# Patient Record
Sex: Female | Born: 1940
Health system: Southern US, Community
[De-identification: ages and names within clinical notes are randomized; demographics above are authoritative.]

## PROBLEM LIST (undated history)

## (undated) DIAGNOSIS — K219 Gastro-esophageal reflux disease without esophagitis: Secondary | ICD-10-CM

## (undated) DIAGNOSIS — I4891 Unspecified atrial fibrillation: Secondary | ICD-10-CM

## (undated) DIAGNOSIS — G2581 Restless legs syndrome: Secondary | ICD-10-CM

## (undated) DIAGNOSIS — I10 Essential (primary) hypertension: Secondary | ICD-10-CM

## (undated) DIAGNOSIS — R011 Cardiac murmur, unspecified: Secondary | ICD-10-CM

## (undated) DIAGNOSIS — M1990 Unspecified osteoarthritis, unspecified site: Secondary | ICD-10-CM

## (undated) DIAGNOSIS — D649 Anemia, unspecified: Secondary | ICD-10-CM

## (undated) DIAGNOSIS — Z9889 Other specified postprocedural states: Secondary | ICD-10-CM

## (undated) DIAGNOSIS — T4145XA Adverse effect of unspecified anesthetic, initial encounter: Secondary | ICD-10-CM

## (undated) DIAGNOSIS — T8859XA Other complications of anesthesia, initial encounter: Secondary | ICD-10-CM

## (undated) DIAGNOSIS — R112 Nausea with vomiting, unspecified: Secondary | ICD-10-CM

## (undated) HISTORY — PX: TONSILLECTOMY: SUR1361

## (undated) HISTORY — PX: VAGINAL HYSTERECTOMY: SUR661

---

## 1959-04-13 HISTORY — PX: BACK SURGERY: SHX140

## 1999-05-08 ENCOUNTER — Other Ambulatory Visit: Admission: RE | Admit: 1999-05-08 | Discharge: 1999-05-08 | Payer: Self-pay | Admitting: Family Medicine

## 2001-02-24 ENCOUNTER — Other Ambulatory Visit: Admission: RE | Admit: 2001-02-24 | Discharge: 2001-02-24 | Payer: Self-pay | Admitting: Obstetrics & Gynecology

## 2002-03-16 ENCOUNTER — Other Ambulatory Visit: Admission: RE | Admit: 2002-03-16 | Discharge: 2002-03-16 | Payer: Self-pay | Admitting: Obstetrics and Gynecology

## 2002-12-21 ENCOUNTER — Encounter: Payer: Self-pay | Admitting: Family Medicine

## 2002-12-21 ENCOUNTER — Encounter: Admission: RE | Admit: 2002-12-21 | Discharge: 2002-12-21 | Payer: Self-pay | Admitting: Family Medicine

## 2004-04-12 HISTORY — PX: JOINT REPLACEMENT: SHX530

## 2004-07-06 ENCOUNTER — Inpatient Hospital Stay (HOSPITAL_COMMUNITY): Admission: RE | Admit: 2004-07-06 | Discharge: 2004-07-11 | Payer: Self-pay | Admitting: Orthopedic Surgery

## 2004-07-06 ENCOUNTER — Ambulatory Visit: Payer: Self-pay | Admitting: Internal Medicine

## 2004-11-06 ENCOUNTER — Encounter: Admission: RE | Admit: 2004-11-06 | Discharge: 2004-11-06 | Payer: Self-pay | Admitting: Specialist

## 2004-11-11 ENCOUNTER — Other Ambulatory Visit: Admission: RE | Admit: 2004-11-11 | Discharge: 2004-11-11 | Payer: Self-pay | Admitting: Obstetrics & Gynecology

## 2005-07-06 ENCOUNTER — Encounter: Admission: RE | Admit: 2005-07-06 | Discharge: 2005-07-06 | Payer: Self-pay | Admitting: Family Medicine

## 2005-11-17 ENCOUNTER — Encounter: Admission: RE | Admit: 2005-11-17 | Discharge: 2005-11-17 | Payer: Self-pay | Admitting: Specialist

## 2006-02-07 ENCOUNTER — Ambulatory Visit: Payer: Self-pay | Admitting: Internal Medicine

## 2006-02-23 ENCOUNTER — Ambulatory Visit: Payer: Self-pay | Admitting: Internal Medicine

## 2006-02-23 ENCOUNTER — Encounter (INDEPENDENT_AMBULATORY_CARE_PROVIDER_SITE_OTHER): Payer: Self-pay | Admitting: Specialist

## 2006-04-12 HISTORY — PX: JOINT REPLACEMENT: SHX530

## 2006-06-12 ENCOUNTER — Encounter: Admission: RE | Admit: 2006-06-12 | Discharge: 2006-06-12 | Payer: Self-pay | Admitting: Obstetrics & Gynecology

## 2007-01-11 ENCOUNTER — Inpatient Hospital Stay (HOSPITAL_COMMUNITY): Admission: RE | Admit: 2007-01-11 | Discharge: 2007-01-14 | Payer: Self-pay | Admitting: Orthopedic Surgery

## 2009-02-21 ENCOUNTER — Encounter: Admission: RE | Admit: 2009-02-21 | Discharge: 2009-02-21 | Payer: Self-pay | Admitting: Internal Medicine

## 2009-04-15 DIAGNOSIS — H8110 Benign paroxysmal vertigo, unspecified ear: Secondary | ICD-10-CM | POA: Insufficient documentation

## 2009-07-08 DIAGNOSIS — K219 Gastro-esophageal reflux disease without esophagitis: Secondary | ICD-10-CM | POA: Diagnosis present

## 2010-08-11 HISTORY — PX: FOOT ARTHRODESIS: SHX1655

## 2010-08-17 ENCOUNTER — Encounter (HOSPITAL_BASED_OUTPATIENT_CLINIC_OR_DEPARTMENT_OTHER)
Admission: RE | Admit: 2010-08-17 | Discharge: 2010-08-17 | Disposition: A | Discharge status: Home / Self Care | Payer: Medicare Other | Source: Ambulatory Visit | Attending: Orthopedic Surgery | Admitting: Orthopedic Surgery

## 2010-08-17 LAB — BASIC METABOLIC PANEL
CO2: 25 mEq/L (ref 19–32)
Calcium: 9.9 mg/dL (ref 8.4–10.5)
Chloride: 102 mEq/L (ref 96–112)
GFR calc Af Amer: >60 mL/min (ref >60)
GFR calc non Af Amer: 51 mL/min — ABNORMAL LOW (ref >60)
Glucose, Bld: 117 mg/dL — ABNORMAL HIGH (ref 70–99)

## 2010-08-19 ENCOUNTER — Ambulatory Visit (HOSPITAL_BASED_OUTPATIENT_CLINIC_OR_DEPARTMENT_OTHER)
Admission: RE | Admit: 2010-08-19 | Discharge: 2010-08-19 | Disposition: A | Discharge status: Home / Self Care | Payer: Medicare Other | Source: Ambulatory Visit | Attending: Orthopedic Surgery | Admitting: Orthopedic Surgery

## 2010-08-19 DIAGNOSIS — K219 Gastro-esophageal reflux disease without esophagitis: Secondary | ICD-10-CM | POA: Insufficient documentation

## 2010-08-19 DIAGNOSIS — M2010 Hallux valgus (acquired), unspecified foot: Secondary | ICD-10-CM | POA: Insufficient documentation

## 2010-08-19 DIAGNOSIS — I10 Essential (primary) hypertension: Secondary | ICD-10-CM | POA: Insufficient documentation

## 2010-08-19 DIAGNOSIS — M6240 Contracture of muscle, unspecified site: Secondary | ICD-10-CM | POA: Insufficient documentation

## 2010-08-19 DIAGNOSIS — M2040 Other hammer toe(s) (acquired), unspecified foot: Secondary | ICD-10-CM | POA: Insufficient documentation

## 2010-08-19 DIAGNOSIS — Z01812 Encounter for preprocedural laboratory examination: Secondary | ICD-10-CM | POA: Insufficient documentation

## 2010-08-25 NOTE — Op Note (Signed)
NAME:  Nancy Baxter, Nancy Baxter               ACCOUNT NO.:  [id number]   MEDICAL RECORD NO.:  [id number]          PATIENT TYPE:  INP   LOCATION:  0002                         FACILITY:  [facility]   PHYSICIAN:  Ollen Gross, M.D.    DATE OF BIRTH:  06/16/1940   DATE OF PROCEDURE:  01/11/2007  DATE OF DISCHARGE:                               OPERATIVE REPORT   PREOPERATIVE DIAGNOSES:  Osteoarthritis right hip.   POSTOPERATIVE DIAGNOSES:  Osteoarthritis right hip.   PROCEDURE:  Right total hip arthroplasty.   SURGEON:  Dr. Lequita Halt.   ASSISTANT:  Avel Peace, PA-C.   ANESTHESIA:  General.   ESTIMATED BLOOD LOSS:  500.   DRAINS:  None.   COMPLICATIONS:  None.   CONDITION:  Stable to recovery.   CLINICAL NOTE:  Nancy Baxter is a 70 year old female with severe end-stage  arthritis of the right hip with progressively worsening pain and  dysfunction. She has had a previous successful left total hip and  presents now for right total hip.   PROCEDURE IN DETAIL:  After successful administration of general  anesthetic, the patient's placed in the left lateral decubitus position  with the right side up and held with the hip positioner.  The right  lower extremity was isolated from the perineum with plastic drapes and  prepped and draped in the usual sterile fashion.  A short posterolateral  incision is made with a 10 blade through subcutaneous tissue to the  level of fascia lata which was incised in line with the skin incision.  The sciatic nerve was palpated and protected and the short external  rotators isolated off the femur.  Capsulectomy was performed and the hip  is dislocated.  The center of the femoral head is marked and a trial  prosthesis placed such that the center of the femoral head corresponds  to the center of the trial head.  The osteotomy line is marked on the  femoral neck and osteotomy made with an oscillating saw.  The femoral  head is removed and then the femur retracted  anteriorly to gain  acetabular exposure.   Acetabular retractors were placed and the labrum and osteophytes  removed.  Reaming starts a 45 mm coursing in increments of 2 up to 49 mm  and then a 50 mm pinnacle acetabular shell was placed in anatomic  position and transfixed with two dome screws with excellent purchase.  The apex hole eliminator and the 36 mm neutral Ultramet metal liner was  placed.   The femur was prepared with the canal finder and irrigation.  Axial  reaming is performed up to 13.5 mm, proximal reaming to the 18 D and the  sleeve machined to a small.  An 18D small trial sleeve is placed with an  18 x 13 stem and a 36 plus 8 neck matching native anteversion.  A 36  plus 0 trial head is placed and the hip is reduced with outstanding  stability.  There is full extension and full external rotation, 70  degrees flexion, 40 degrees adduction and 90 degrees of internal  rotation and 90  degrees of flexion with 70 degrees of internal rotation.  By placing the right leg on top of the left it felt as though the leg  lengths were equal.  The hip was then dislocated and the trials were  removed.  The permanent 18D small sleeve is placed with the 18 x 13 stem  and a 36 plus 8 neck matching native anteversion.  A 36 plus 0 head is  placed, the hip is reduced with the same stability parameters.  The  wound is copiously irrigated with saline solution and the short rotators  reattached to the femur through drill holes. The fascia was closed with  interrupted #1 Vicryl, subcu closed with #1 and 2-0 Vicryl, subcuticular  with running 4-0 Monocryl.  The incision was cleaned and dried and Steri-  Strips and a bulky sterile dressing were applied.  She was then placed  into a knee immobilizer, awakened and transported to recovery in stable  condition.      Ollen Gross, M.D.  Electronically Signed     FA/MEDQ  D:  01/11/2007  T:  01/11/2007  Job:  [id number]

## 2010-08-27 NOTE — Op Note (Signed)
NAME:  Nancy Baxter, SCAGLIONE               ACCOUNT NO.:  [id number]  MEDICAL RECORD NO.:  [id number]          PATIENT TYPE:  LOCATION:                                 FACILITY:  PHYSICIAN:  Leonides Grills, M.D.     DATE OF BIRTH:  Nov 24, 1940  DATE OF PROCEDURE:  08/19/2010 DATE OF DISCHARGE:                              OPERATIVE REPORT   PREOPERATIVE DIAGNOSES: 1. Left hallux valgus. 2. Left second hammertoe. 3. Left tight gastroc.  POSTOPERATIVE DIAGNOSES: 1. Left hallux valgus. 2. Left second hammertoe. 3. Left tight gastroc.  OPERATION: 1. Left modified McBride bunionectomy. 2. Left gastroc slide. 3. Left second toe metatarsophalangeal  joint dorsal capsulotomy with     collateral release. 4. Left second toe proximal phalanx head resection. 5. Left second toe extensor digitorum brevis to extensor digitorum     longus tendon transfer. 6. Left second toe flexor digitorum longus to proximal phalanx tendon     transfer.  ANESTHESIA:  General.  SURGEON:  Leonides Grills, MD  ASSISTANT:  Richardean Canal, PA-C  ESTIMATED BLOOD LOSS:  Minimal.  TOURNIQUET TIME:  None.  COMPLICATIONS:  None.  DISPOSITION:  Stable to PR.  INDICATIONS:  This is a 70 year old female who has had longstanding left forefoot pain that was interfering with her life to the point where she cannot do what she wants to do despite conservative management.  She was consented to the above procedure.  All risks of infection, nerve or vessel injury, persistent pain, worsening pain, prolonged recovery, stiffness, arthritis, cock-up toe deformity, fact that her second toe would be stiff, possibility of doing a Chevron bunionectomy, wound healing problems, DVT, PE, recurrence of hallux valgus, Bowman hallux varus were all explained.  Questions were encouraged and answered.  OPERATION:  The patient was brought to the operating room and placed in supine position after adequate general anesthesia was administered  as well as Ancef 1 g IV piggyback.  Left lower extremity was prepped and draped in sterile manner and proximally placed the thigh tourniquet.  We started the procedure with a longitudinal incision on the medial aspect gastrocnemius musculotendinous junction.  Dissection was carried down through the skin.  Hemostasis was obtained.  Fascia was opened in line with its incision.  Conjoint region was then developed to the gastroc soleus.  Soft tissue was elevated off the posterior aspect of the gastrocnemius.  Sural nerves identified and protected posteriorly throughout the case.  With the retractor gastrocnemius then released with curved Mayo scissors.  This had an excellent release tight gastroc. The area was copiously irrigated with normal saline.  Subcu was closed with 3-0 Vicryl, skin was closed with 4-0 Monocryl subcuticular stitch after the area was copiously irrigated with normal saline.  Steri-Strips were applied.  We then made a longitudinal incision over the medial aspect of the left great toe MTP joint.  Dissection was carried down through the skin.  Hemostasis was obtained.  Dorsomedial digital nerve was carefully dissected out and retracted of harm's way throughout the case.  L-shaped capsulotomy was then made.  A medial bunion eminence was shaved off with a sagittal saw.  Alona Bene ridge was then rounded off with a rongeur.  Joint area was copiously irrigated with normal saline. Lateral capsule was then released with a curved Beaver blade protecting the cartilaginous surface with Therapist, nutritional.  We then obtained an x- ray and the sesamoids were well located and the bunion corrected beautifully with the above portions of the procedure.  We then elected not to do a Chevron bunionectomy.  We then reconstructed the capsule by advancing both superiorly and proximally and repaired this with 2-0 Vicryl stitches had an outstanding repair.  Range of motion of the toe was excellent.   We then obtained an x-ray to verify the sesamoids located and the correction was excellent.  Redundant capsule was trimmed off with a scalpel.  The area was copiously irrigated with normal saline.  We then made a longitudinal incision over the left second toe dorsally.  Dissection was carried down through the skin.  Hemostasis was obtained.  EDB and EDL tendons were carefully dissected out.  EDL tendon was tenotomized proximal medial and brevis distal lateral and retracted out of harm's way for the remaining procedure for later transfer.  MTP joint dorsal capsulotomy with collateral release was then performed with a 15 blade scalpel and this had excellent release and tight capsule.  We then dissected the distal aspect of the proximal phalanx around the head.  This was then carefully skeletonized and the head was then removed with the rongeur followed by bone cutter.  This cut was made perpendicular to the long axis of the proximal phalanx.  We then made a longitudinal incision into the plantar plate of the PIP joint.  FPL tendon was identified and tenotomized as distal as possible.  We then split the tendon within the raphe and wrapped each limb of the tendon around the shaft of the proximal phalanx.  We then transferred the FPL to proximal phalanx and reconstructed this with 4-0 PDS stitch.  This had an Conservation officer, historic buildings.  We then placed a 0.045 K-wire antegrade through the middle and distal phalanx, reduced the PIP joint and the MTP joint and fired this retrograde across the MTP joint.  This held the toe in the desired position.  We then transferred the EDB to EDL tendon dorsally using 4-0 PDS stitch and sewed this to the stump of the FPL tendon dorsally as well.  This had an Conservation officer, historic buildings.  This recreated the extension expansion union of the toe as well.  Final x-ray was obtained to verify that the toe was in excellent alignment.  The area was copiously irrigated with normal  saline.  Skin was closed with 4- 0 nylon stitch over all wounds.  Sterile dressing was applied.  Roger Mann dressing was applied.  Hard sole shoe was applied.  The patient was stable to the PR.  The patient compartment Daleen Snook, M.D.     PB/MEDQ  D:  08/19/2010  T:  08/19/2010  Job:  [id number]  Electronically Signed by Leonides Grills M.D. on 08/27/2010 03:08:27 PM

## 2010-08-28 NOTE — Discharge Summary (Signed)
NAMEGWENEVERE, GOGA               ACCOUNT NO.:  [id number]   MEDICAL RECORD NO.:  [id number]          PATIENT TYPE:  INP   LOCATION:  1617                         FACILITY:  [facility]   PHYSICIAN:  Ollen Gross, M.D.    DATE OF BIRTH:  Jul 29, 1940   DATE OF ADMISSION:  01/11/2007  DATE OF DISCHARGE:  01/14/2007                               DISCHARGE SUMMARY   ADMITTING DIAGNOSIS:  1. Osteoarthritis right hip.  2. Hypertension.  3. Cardiac murmur.  4. Reflux disease.  5. Hemorrhoids.  6. Degenerative disk disease.  7. Postmenopausal.   DISCHARGE DIAGNOSIS:  1. Osteoarthritis right hip status post right total hip arthroplasty.  2. Mild postop blood loss anemia.  3. Hyponatremia.  4. Hypertension.  5. Cardiac murmur.  6. Reflux disease.  7. Hemorrhoids.  8. Degenerative disk disease.  9. Postmenopausal.   PROCEDURE:  January 11, 2007 right total hip.   SURGEON:  Ollen Gross, M.D.   ASSISTANT:  Alexzandrew L. Perkins, P.A.C.   ANESTHESIA:  General.   CONSULTS:  None.   BRIEF HISTORY:  Ms. Fiske is a 70 year old female with end-stage  arthritis of the right hip with progressive worsening pain and  dysfunction, previous successful left total hip, and now presents for  right total hip.   LABORATORY DATA:  Preop CBC showed hemoglobin of 14.4, hematocrit of  41.3, white cell count 8.0; postop hemoglobin 10.7 drifted down to 9.5,  last noted H&H 9.1 and 26.3.  PT/PTT preop 13.2 and 27 respectively.  INR of 1.0.  Serial ProTime followed, last noted PT/INR 24.0 and 2.1.  Chem panel, on admission, all within normal limits.  Serum BMETs were  followed.  Sodium did drop from 139 to 131, last noted at a low level of  129.  Preop UA moderate leukocyte esterase, many epithelials, 3-6 white  cells.  Blood group type O+.  EKG on December 29, 2006 sinus rhythm,  abnormal confirmed.  Right hip films January 06, 2007 marked  progression in the right hip, osteoarthritis without acute  bony  abnormality noted in left total hip portable hip.  Pelvis film, status  post right total hip replacement on January 10, 2006.   HOSPITAL COURSE:  The patient was admitted to [facility],  tolerated the procedure well, and later transferred to the recovery room  and orthopedic floor.  Started on p.c. and p.o. analgesics for pain  control following surgery.  She was doing pretty well on morning of day  #1 and had excellent urinary output.  Started back on her heart meds  with parameters and a little bit dilutional sodium noted, so we  decreased the fluids.  She started getting up with PT.  By day #2  incision looked good.  Sodium was still a little low and we discontinued  the fluids.  She had low hemoglobin of 9.5, but was asymptomatic with  that and we put her on iron supplements, continued to progress well with  PT. On day #2 she was up ambulating approximately 5 feet continued to  progress well, and by day #3, she was  ambulating 100 feet, tolerating  her meds, and was ready to home.  Did recommend some fluid, free water  restriction, because of a low sodium of 129.   DISCHARGE/PLAN:  1. The patient was discharged home on January 14, 2007.  2. Discharge diagnoses please see above.   DISCHARGE MEDS:  Percocet, Robaxin, Coumadin.   ACTIVITY:  Partial weightbearing 25%-50% to the right lower extremity.  Home health PT, home health nursing total hip protocol with hip  cautions.   FOLLOWUP:  2 weeks.   DISPOSITION:  Home.   CONDITIONS UPON DISCHARGE:  Improving.      Alexzandrew L. Perkins, P.A.C.      Ollen Gross, M.D.  Electronically Signed    ALP/MEDQ  D:  01/31/2007  T:  02/01/2007  Job:  [id number]

## 2010-08-28 NOTE — Op Note (Signed)
NAME:  Nancy, Baxter               ACCOUNT NO.:  [id number]   MEDICAL RECORD NO.:  [id number]          PATIENT TYPE:  INP   LOCATION:  X009                         FACILITY:  [facility]   PHYSICIAN:  Ollen Gross, M.D.    DATE OF BIRTH:  04-Nov-1940   DATE OF PROCEDURE:  07/06/2004  DATE OF DISCHARGE:                                 OPERATIVE REPORT   PREOPERATIVE DIAGNOSIS:  Osteoarthritis, left hip.   POSTOPERATIVE DIAGNOSIS:  Osteoarthritis, left hip.   PROCEDURE:  Left total hip arthroplasty.   SURGEON:  Ollen Gross, M.D.   ASSISTANT:  Alexzandrew L. Julien Girt, P.A.   ANESTHESIA:  General.   ESTIMATED BLOOD LOSS:  400.   DRAINS:  Hemovac x1.   COMPLICATIONS:  None.   CONDITION:  Stable to recovery room.   CLINICAL NOTE:  Nancy Baxter is a 70 year old female who has developed end-  stage arthritis of the left hip with intractable pain.  She has failed  nonoperative management and presents now for left total hip arthroplasty.   PROCEDURE IN DETAIL:  After the successful administration of general  anesthetic, the patient was placed in the right lateral decubitus position  with the left side up and held with the hip positioner.  The left lower  extremity was isolated from paraneoplastic drapes and prepped and draped in  the usual sterile fashion.  A short posterolateral incision was made with a  10-blade through subcutaneous tissue to the level of the fascia lata which  was incised in line with the skin incision.  Sciatic nerve was palpated and  protected and a short rotator was isolated off the femur.  Capsulectomy was  performed and the hip was dislocated.  The center of the femoral head was  marked and a trial prosthesis placed such that the center of the trial head  corresponded to the center of the native femoral head.  Osteotomy line is  marked on the femoral neck and osteotomy made with an oscillating saw.  The  femur was then retracted anteriorly to gain acetabular  exposure.   We removed the labrum and osteophytes.  She will have hypertrophic tissue in  the fovea and inferiorly.  All of this was removed.  Reaming starts at 45 mm  coursing in increments of 2 mm up to 49.  A 50-mm Pinnacle acetabular shell  was placed in anatomic position with excellent press-fit and then was  transfixed with an additional two dome screws.  The trial 28-mm 50 liner was  then placed.   Femur was prepared, first with a canal finder and then irrigation.  Axial  reaming was performed up to 13.5 mm, proximal reaming to an 18D, and the  sleeve machined to a small.  An 18D small trial sleeve was placed with an 18  x 13 stem and a 36 +8 neck.  I matched her native anteversion.  Trial 28 +0  head is placed and the hip is reduced with great stability with full  extension, full external rotation, 70 degrees of flexion, 40 degrees  adduction, and 90 degrees internal rotation, and 90  degrees flexion and 90  degrees internal rotation.  By placing the left leg on top of the right, the  leg lengths are found to be equal.  The hip is then dislocated and all  trials removed.  The permanent apex hole eliminator was placed into the  acetabular shell, then the permanent 28-mm neutral Ultamet metal liner was  placed.  This is a metal-on-metal hip replacement.  The permanent 18D small  sleeve was placed with the 18 x 13 stem, 36 +8 neck, matching her native  anteversion.  A 28 +0 head is then placed and the hip is reduced from the  same stability parameters.  The wound is copiously irrigated with saline  solution and the short rotators are reattached to the femur through drill  holes.  The fascia lata is closed over a Hemovac drain with interrupted #1  Vicryl, subcu closed with #1 and 2-0 Vicryl, and subcuticular with running 4-  0 Monocryl.  Then 20 cc of 0.25% Marcaine with epinephrine were injected  into the subcutaneous tissues.  Steri-Strips and a bulky sterile dressing  were applied  and she was then placed into a knee immobilizer, awakened, and  transported to recovery in stable condition.      FA/MEDQ  D:  07/06/2004  T:  07/06/2004  Job:  [id number]

## 2010-08-28 NOTE — H&P (Signed)
NAME:  Nancy Baxter, Nancy Baxter               ACCOUNT NO.:  [id number]   MEDICAL RECORD NO.:  [id number]          PATIENT TYPE:  INP   LOCATION:  NA                           [facility]ACILITY:  [facility]   PHYSICIAN:  Ollen Gross, M.D.    DATE OF BIRTH:  1940-10-02   DATE OF ADMISSION:  DATE OF DISCHARGE:                              HISTORY & PHYSICAL   CHIEF COMPLAINT:  Right hip pain.   HISTORY OF PRESENT ILLNESS:  The patient is a 70 year old female who has  been seen by Dr. Lequita Halt for ongoing right hip pain.  She had known  arthritis in the left hip, and underwent a total hip on that left side,  back in March 2006.  She is doing well with her left hip, but continues  to have progressive pain in the right.  She is noted to be a bone-on-  bone, and felt to be a good candidate for surgery.  Risks and benefits  discussed.  The patient is subsequently admitted to the hospital.   ALLERGIES:  No known drug allergies.   INTOLERANCES:  Codeine causes nausea, steroids cause Cushing's disease  symptoms.   CURRENT MEDICATIONS:  HCTZ, Toprol XL, Cozaar, Requip, Darvocet N 100,  iron supplement, stool softener, Caltrate plus D, low-dose aspirin,  Prilosec, omega 3 fish oil, glucosamine sulfate.   PAST MEDICAL HISTORY:  1. Hypertension.  2. Cardiac murmur.  3. Hemorrhoids.  4. Degenerative disk disease.  5. Postmenopausal.   PAST SURGICAL HISTORY:  1. Hysterectomy.  2. Left hip replacement in March 2006.  3. Spinal fusion.   SOCIAL HISTORY:  Married, semi-retired, nonsmoker.  No alcohol.  Two  children.   FAMILY HISTORY:  Mother with arthritis, hypertension, diabetes and  cancer.  Father with history of heart disease.   REVIEW OF SYSTEMS:  GENERAL:  No fevers, chills, night sweats.  NEURO:  No seizures or paralysis.  RESPIRATORY:  No shortness breath, productive  cough or hemoptysis.  CARDIOVASCULAR:  No chest pain, no orthopnea.  GI:  No nausea, vomiting, diarrhea, or constipation.  GU: No  dysuria,  hematuria, or discharge.  MUSCULOSKELETAL: Right hip.   PHYSICAL EXAMINATION:  VITAL SIGNS:  Pulse 64, respirations 12, blood  pressure 132/60.  GENERAL: A 70 year old white female well-nourished, well-developed, in  no acute distress.  Short stature, alert, oriented, and cooperative,  pleasant, excellent historian.  HEENT:  Normocephalic, atraumatic.  Pupils are equal and reactive.  Oropharynx clear.  EOMs intact.  NECK:  Supple.  CHEST:  Clear.  Anterior and posterior chest reveals no rhonchi, rales  or wheezing.  HEART:  Regular rate and rhythm with a grade 2-3/6 systolic ejection  murmur, S1-S2 noted.  ABDOMEN:  Soft, nontender.  Bowel sounds present.  RECTAL, BREASTS, AND GENITALIA:  Not done, not pertinent to present  illness.  EXTREMITIES:  Right hip-flexion 95 degrees, 0 internal rotation, minimal  external rotation by 10-20 degrees, range of motion of 10-50 degrees  abduction.   IMPRESSION:  1. Osteoarthritis right hip.  2. Hypertension.  3. Cardiac murmur.  4. Reflux disease.  5. Hemorrhoids.  6.  Degenerative disk disease.  7. Postmenopausal.   PLAN:  The patient will be admitted to [facility] to undergo a  right total hip replacement arthroplasty.  Surgery will be performed by  Dr. Ollen Gross.      Alexzandrew L. Perkins, P.A.C.      Ollen Gross, M.D.  Electronically Signed    ALP/MEDQ  D:  01/10/2007  T:  01/11/2007  Job:  [id number]   cc:   Gwen Pounds, MD  Fax: [phone]   Ollen Gross, M.D.  Fax: [phone]   Cassell Clement, M.D.  Fax: [phone]   W. Varney Baas, M.D.  Fax: [phone]

## 2010-08-28 NOTE — Discharge Summary (Signed)
NAMETAMLYN, SIDES               ACCOUNT NO.:  [id number]   MEDICAL RECORD NO.:  [id number]          PATIENT TYPE:  INP   LOCATION:  0464                         FACILITY:  [facility]   PHYSICIAN:  Ollen Gross, M.D.    DATE OF BIRTH:  1941/04/12   DATE OF ADMISSION:  07/06/2004  DATE OF DISCHARGE:  07/11/2004                                 DISCHARGE SUMMARY   ADMITTING DIAGNOSES:  1.  Osteoarthritis, left hip.  2.  Hypertension.  3.  Heart murmur.  4.  Reflux disease.  5.  Hemorrhoids.  6.  Degenerative disk disease.  7.  Postmenopausal.   DISCHARGE DIAGNOSES:  1.  Osteoarthritis, left hip, status post left total hip arthroplasty.  2.  Postoperative blood loss anemia that did not require transfusion.  3.  Postoperative hyponatremia, improved.  4.  Postoperative hypokalemia, improved.  5.  Hypertension.  6.  Heart murmur.  7.  Hemorrhoids.  8.  Degenerative disk disease.  9.  Postmenopausal.  10. Postoperative urinary retention.   PROCEDURE:  On July 06, 2004, left total hip arthroplasty surgery by Dr.  Ollen Gross.   ASSESSMENT:  Alexzandrew L. Julien Girt, P.A.   ANESTHESIA:  General.   BLOOD LOSS:  100 mL.   Hemovac drain x1.   BRIEF HISTORY AND PHYSICAL:  Ms. Raigoza is a 70 year old female who has  developed end-stage arthritis of the left hip with intractable pain.  She  has failed nonoperative management, and now presents for a total hip  arthroplasty.   CONSULTATIONS:  None.   LABORATORY DATA:  Preop CBC showed a hemoglobin of 12.7, hematocrit of 38.7;  differential within normal limits.  Postop hemoglobin 9.7, and continued to  decline down to 8.5 and then back up to 9.2.  Last hematocrit at 27.4.  PT/PTT preop 12.2 and 26.0 respectively.  INR 0.9.  Last noted PT/INR 18.4  and 1.9.  Chem panel on admission:  Low sodium of 130, low albumin of 3.4.  Remaining chem panel all within normal limits.  Serial BMETs were followed.  Sodium did drop down further to  125 and back up to 131.  Potassium started  out at 4.2, but dropped down to 3.2 and back up to 3.6.  Glucose 95, 141 and  back down to 101.  Urinalysis preop negative.  Blood type O positive.  Portable pelvis and hip films:  Anatomic alignment status post left total  hip arthroplasty.   HOSPITAL COURSE:  Admitted to the hospital on the date of surgery, and  underwent above procedure without any complications.  The patient tolerated  the procedure well and later transferred to recovery room and then the  orthopedic floor with continued postop care.  Did have low sodium going into  surgery, but that was much lower on the day after.  Fluids were KVO.  She  had already realized her deep pain had improved.  Started out on p.o. and  PCA analgesics.  post-op  day 1.  She was having some nausea on day 1, and  still had a little bit of intermittent nausea on day  2 and not used much of  her PCA.  Hemoglobin had dropped postoperatively down to 8.8; preop level of  12.7, but did not have any symptoms.  Sodium was back up after fluids were  changed.  She had a drop in her potassium.  She was given a potassium  supplement.  Dressings were changed on day 2.  The incision was healing  well.  By day 3, her potassium had improved with the supplementation.  She  started to get up and move around much better with physical therapy.  She  was up ambulating approximately 40 and 50 feet on day 2 and then got up to  150 feet on day 3.  She was doing so well by the following  that she would  be able to go home in the next couple of days.  By day 4, she had only  voided one time after the Foley had been removed, and had to require  imminent caths.  She was going to be set up to go home on the evening of  July 10, 2004, but still could not void her urine so she was held another  day because of urinary retention.  By the following day, on July 11, 2004,  though she had started to void her urine, and voided a couple  of times in  the night and once that morning.  When she had done this, arrangements had  been made, and she was discharged home and discharge meds planned.  Discharged home on July 11, 2004  please see above.   DISCHARGE MEDICATIONS:  Percocet, Robaxin, and Coumadin.   DIET:  As tolerated.   FOLLOWUP:  Followup is in 2 weeks.   ACTIVITY:  Partial weightbearing of 25-50% on left lower extremity, hip  precautions, home PT and home  nursing.   DISPOSITION:  Home.   CONDITION ON DISCHARGE:  Improved.   DICTATED BY:  Alexzandrew L. Julien Girt, P.A.      ALP/MEDQ  D:  08/19/2004  T:  08/19/2004  Job:  [id number]   cc:   Ollen Gross, M.D.  [facility]  [address]  [address]  [place]  [place] [address]  Fax: [phone]   Cassell Clement, M.D.  [address], [address]  [place]  [place] [address]  Fax: [phone]   W. Varney Baas, M.D.  Fax: [phone]

## 2010-08-28 NOTE — H&P (Signed)
NAME:  Nancy Baxter, Nancy Baxter               ACCOUNT NO.:  [id number]   MEDICAL RECORD NO.:  [id number]          PATIENT TYPE:  INP   LOCATION:  NA                           [facility]  [facility]   PHYSICIAN:  Ollen Gross, M.D.    DATE OF BIRTH:  [birth year]   DATE OF ADMISSION:  07/06/2004  DATE OF DISCHARGE:                                HISTORY & PHYSICAL   DATE OF OFFICE VISIT AND HISTORY AND PHYSICAL:  June 24, 2004   CHIEF COMPLAINT:  Left hip pain.   HISTORY OF PRESENT ILLNESS:  The patient is a 70 year old female, who has  been seen by Dr. Lequita Halt for ongoing left hip pain.  She was initially seen  earlier this year, and she is known to have end-stage arthritis with bone-on-  bone changes.  She had x-rays done this past fall to show the advanced  arthritis.  The pain and dysfunction has been increasing in severity.  She  has reached the point where she would like to have something done about  this.  It was felt she would benefit from undergoing surgical intervention.  Risks and benefits discussed.  The patient subsequently admitted to the  hospital.   ALLERGIES:  No known drug allergies.   CURRENT MEDICATIONS:  1.  Nexium 40 mg daily.  2.  Toprol XL 50 mg daily.  3.  __________.  4.  Hydrochlorothiazide daily.  5.  Cozaar 50 mg daily.  6.  Naprelan 375 mg b.i.d., stopped.  7.  Vivelle Dot Estradiol 0.0375 mg patch.  8.  Glucosamine sulfate 1000 mg daily, stopped.  9.  Aspirin, stopped.   PAST MEDICAL HISTORY:  1.  Hypertension.  2.  Heart murmur.  3.  Reflux disease.  4.  Hemorrhoids.  5.  Degenerative disk disease.  6.  Postmenopausal.   PAST SURGICAL HISTORY:  1.  Spinal fusion, April 1961.  2.  Hysterectomy in January 1971,   SOCIAL HISTORY:  Married, Warden/ranger, nonsmoker, no alcohol, and  has two children.  Her husband would be assisting with care after surgery.   FAMILY HISTORY:  Mother with a history of arthritis, breast cancer,  diabetes,  hypertension.  Grandmother with a history of hypertension,  diabetes, breast cancer, and arthritis.  Grandfather with history of  prostate.   REVIEW OF SYSTEMS:  GENERAL:  No fever, chills, or night sweats.  NEUROLOGIC:  No seizure, syncope, paralysis.  RESPIRATORY:  No shortness of  breath, productive cough, or hemoptysis.  CARDIOVASCULAR:  No chest pain,  angina, or orthopnea.  GI:  No nausea, vomiting, diarrhea, or constipation.  GU:  No dysuria, hematuria, or discharge.  MUSCULOSKELETAL:  Left hip found  in the history of present illness.   PHYSICAL EXAMINATION:  VITAL SIGNS:  Pulse 60, respirations 12, blood  pressure 123/72.  GENERAL:  A 70 year old female, well-nourished, well-developed, in no acute  distress.  Alert, oriented, and cooperative.  HEENT:  Normocephalic, atraumatic.  Pupils are round and reactive.  EOMs are  intact.  NECK:  Supple.  No carotid bruits.  CHEST:  Clear anterior and posterior  chest walls.  No rhonchi, rales, or  wheezing.  HEART:  Regular rhythm without a faint murmur.  S1, S2 noted.  ABDOMEN:  Soft, nontender.  Bowel sounds present.  RECTAL/BREASTS/GENITALIA:  Not done.  Not pertinent to present illness.  EXTREMITIES:  Left hip:  The left hip on passive range of motion shows only  flexion of 90 degrees.  Internal rotation 5, external rotation of 10.  Abduction of about 15.  She does ambulate with a slightly antalgic gait.   IMPRESSION:  1.  Osteoarthritis, left hip.  2.  Hypertension.  3.  Heart murmur.  4.  Reflux disease.  5.  Hemorrhoids.  6.  Degenerative disk disease.  7.  Postmenopausal.   PLAN:  The patient will be admitted to [facility] to undergo a  left total hip arthroplasty.  Surgery will be performed by Dr. Ollen Gross.      ALP/MEDQ  D:  07/05/2004  T:  07/05/2004  Job:  [id number]   cc:   Cassell Clement, M.D.  [address], [address]  [place]  [place] [address]  Fax: [phone]   W. Varney Baas, M.D.  Fax:  [phone]   Ollen Gross, M.D.  [facility]  [address]  [address]  [place]  [place] [address]  Fax: [phone]

## 2011-01-21 LAB — CBC
HCT: 41.3
Hemoglobin: 10.7 — ABNORMAL LOW
Hemoglobin: 9.5 — ABNORMAL LOW
Hemoglobin: 14.4
MCHC: 34.6
MCV: 86.6
MCV: 87
RBC: 3.04 — ABNORMAL LOW
RBC: 3.15 — ABNORMAL LOW
RDW: 13.9
RDW: 14
WBC: 8
WBC: 8

## 2011-01-21 LAB — BASIC METABOLIC PANEL
BUN: 7
CO2: 28
CO2: 29
Calcium: 8.1 — ABNORMAL LOW
Chloride: 99
Creatinine, Ser: 0.83
Creatinine, Ser: 0.99
GFR calc Af Amer: >60
Glucose, Bld: 112 — ABNORMAL HIGH
Potassium: 3.7
Potassium: 3.8
Sodium: 129 — ABNORMAL LOW
Sodium: 131 — ABNORMAL LOW

## 2011-01-21 LAB — PROTIME-INR
INR: 1
Prothrombin Time: 15
Prothrombin Time: 24 — ABNORMAL HIGH
Prothrombin Time: 13.2

## 2011-01-21 LAB — COMPREHENSIVE METABOLIC PANEL
Alkaline Phosphatase: 102
BUN: 17
CO2: 27
Chloride: 102
Glucose, Bld: 98
Potassium: 4.6
Total Bilirubin: 0.7

## 2011-01-21 LAB — CROSSMATCH

## 2011-01-21 LAB — URINALYSIS, ROUTINE W REFLEX MICROSCOPIC
Bilirubin Urine: NEGATIVE
Hgb urine dipstick: NEGATIVE
Specific Gravity, Urine: 1.01
Urobilinogen, UA: 0.2

## 2011-01-21 LAB — URINE MICROSCOPIC-ADD ON

## 2011-01-21 LAB — ABO/RH: ABO/RH(D): O POS

## 2011-02-19 ENCOUNTER — Encounter: Payer: Self-pay | Admitting: Internal Medicine

## 2011-02-22 ENCOUNTER — Other Ambulatory Visit: Payer: Self-pay | Admitting: Orthopedic Surgery

## 2011-02-22 ENCOUNTER — Encounter (HOSPITAL_BASED_OUTPATIENT_CLINIC_OR_DEPARTMENT_OTHER)
Admission: RE | Admit: 2011-02-22 | Discharge: 2011-02-22 | Disposition: A | Discharge status: Home / Self Care | Payer: Medicare Other | Source: Ambulatory Visit | Attending: Orthopedic Surgery | Admitting: Orthopedic Surgery

## 2011-02-22 ENCOUNTER — Encounter (HOSPITAL_BASED_OUTPATIENT_CLINIC_OR_DEPARTMENT_OTHER): Payer: Self-pay | Admitting: *Deleted

## 2011-02-22 LAB — BASIC METABOLIC PANEL
BUN: 31 mg/dL — ABNORMAL HIGH (ref 6–23)
Creatinine, Ser: 1.34 mg/dL — ABNORMAL HIGH (ref 0.50–1.10)
GFR calc Af Amer: 45 mL/min — ABNORMAL LOW (ref >90)
GFR calc non Af Amer: 39 mL/min — ABNORMAL LOW (ref >90)

## 2011-02-22 NOTE — Progress Notes (Signed)
Here 5/12-ekg done then

## 2011-02-23 ENCOUNTER — Encounter (HOSPITAL_BASED_OUTPATIENT_CLINIC_OR_DEPARTMENT_OTHER): Payer: Self-pay | Admitting: Anesthesiology

## 2011-02-23 ENCOUNTER — Other Ambulatory Visit: Payer: Self-pay | Admitting: Orthopedic Surgery

## 2011-02-23 ENCOUNTER — Encounter (HOSPITAL_BASED_OUTPATIENT_CLINIC_OR_DEPARTMENT_OTHER): Payer: Self-pay | Admitting: *Deleted

## 2011-02-23 ENCOUNTER — Encounter (HOSPITAL_BASED_OUTPATIENT_CLINIC_OR_DEPARTMENT_OTHER)
Admission: RE | Disposition: A | Discharge status: Home / Self Care | Payer: Self-pay | Source: Ambulatory Visit | Attending: Orthopedic Surgery

## 2011-02-23 ENCOUNTER — Ambulatory Visit (HOSPITAL_BASED_OUTPATIENT_CLINIC_OR_DEPARTMENT_OTHER)
Admission: RE | Admit: 2011-02-23 | Discharge: 2011-02-23 | Disposition: A | Discharge status: Home / Self Care | Payer: Medicare Other | Source: Ambulatory Visit | Attending: Orthopedic Surgery | Admitting: Orthopedic Surgery

## 2011-02-23 ENCOUNTER — Ambulatory Visit (HOSPITAL_BASED_OUTPATIENT_CLINIC_OR_DEPARTMENT_OTHER): Payer: Medicare Other | Admitting: Anesthesiology

## 2011-02-23 DIAGNOSIS — K219 Gastro-esophageal reflux disease without esophagitis: Secondary | ICD-10-CM | POA: Insufficient documentation

## 2011-02-23 DIAGNOSIS — M6530 Trigger finger, unspecified finger: Secondary | ICD-10-CM | POA: Insufficient documentation

## 2011-02-23 DIAGNOSIS — Z01812 Encounter for preprocedural laboratory examination: Secondary | ICD-10-CM | POA: Insufficient documentation

## 2011-02-23 DIAGNOSIS — M6740 Ganglion, unspecified site: Secondary | ICD-10-CM | POA: Insufficient documentation

## 2011-02-23 DIAGNOSIS — I10 Essential (primary) hypertension: Secondary | ICD-10-CM | POA: Insufficient documentation

## 2011-02-23 HISTORY — PX: EAR CYST EXCISION: SHX22

## 2011-02-23 HISTORY — DX: Other specified postprocedural states: Z98.890

## 2011-02-23 HISTORY — DX: Essential (primary) hypertension: I10

## 2011-02-23 HISTORY — DX: Unspecified osteoarthritis, unspecified site: M19.90

## 2011-02-23 HISTORY — DX: Gastro-esophageal reflux disease without esophagitis: K21.9

## 2011-02-23 HISTORY — DX: Other specified postprocedural states: R11.2

## 2011-02-23 HISTORY — DX: Other complications of anesthesia, initial encounter: T88.59XA

## 2011-02-23 HISTORY — DX: Adverse effect of unspecified anesthetic, initial encounter: T41.45XA

## 2011-02-23 HISTORY — PX: TRIGGER FINGER RELEASE: SHX641

## 2011-02-23 SURGERY — RELEASE, A1 PULLEY, FOR TRIGGER FINGER
Anesthesia: Regional | Site: Hand | Laterality: Right | Wound class: Clean

## 2011-02-23 MED ORDER — LACTATED RINGERS IV SOLN
INTRAVENOUS | Status: DC
  Administered 2011-02-23 (×2): via INTRAVENOUS

## 2011-02-23 MED ORDER — VANCOMYCIN HCL IN DEXTROSE 1-5 GM/200ML-% IV SOLN: 1000.0000 mg | INTRAVENOUS | Status: DC

## 2011-02-23 MED ORDER — CHLORHEXIDINE GLUCONATE 4 % EX LIQD: 60.0000 mL | Freq: Once | CUTANEOUS | Status: DC

## 2011-02-23 MED ORDER — CLINDAMYCIN PHOSPHATE 600 MG/50ML IV SOLN: 600.0000 mg | INTRAVENOUS | Status: DC

## 2011-02-23 MED ORDER — LIDOCAINE HCL (PF) 0.5 % IJ SOLN
INTRAMUSCULAR | Status: DC | PRN
  Administered 2011-02-23: 30 mg

## 2011-02-23 MED ORDER — FENTANYL CITRATE 0.05 MG/ML IJ SOLN
INTRAMUSCULAR | Status: DC | PRN
  Administered 2011-02-23: 100 ug via INTRAVENOUS

## 2011-02-23 MED ORDER — MIDAZOLAM HCL 5 MG/5ML IJ SOLN
INTRAMUSCULAR | Status: DC | PRN
  Administered 2011-02-23: 2 mg via INTRAVENOUS

## 2011-02-23 MED ORDER — ONDANSETRON HCL 4 MG/2ML IJ SOLN
INTRAMUSCULAR | Status: DC | PRN
  Administered 2011-02-23: 4 mg via INTRAVENOUS

## 2011-02-23 MED ORDER — PENTAZOCINE-NALOXONE 50-0.5 MG PO TABS: 1.0000 | ORAL_TABLET | ORAL | Status: AC | PRN

## 2011-02-23 MED ORDER — CEFAZOLIN SODIUM 1-5 GM-% IV SOLN
INTRAVENOUS | Status: DC | PRN
  Administered 2011-02-23: 1 g via INTRAVENOUS

## 2011-02-23 MED ORDER — BUPIVACAINE HCL (PF) 0.25 % IJ SOLN
INTRAMUSCULAR | Status: DC | PRN
  Administered 2011-02-23: 5 mL

## 2011-02-23 MED ORDER — PROPOFOL 10 MG/ML IV EMUL
INTRAVENOUS | Status: DC | PRN
  Administered 2011-02-23: 20 mg via INTRAVENOUS
  Administered 2011-02-23: 40 mg via INTRAVENOUS

## 2011-02-23 SURGICAL SUPPLY — 45 items
BANDAGE COBAN STERILE 2 (GAUZE/BANDAGES/DRESSINGS) ×3 IMPLANT
BANDAGE GAUZE ELAST BULKY 4 IN (GAUZE/BANDAGES/DRESSINGS) IMPLANT
BLADE MINI RND TIP GREEN BEAV (BLADE) IMPLANT
BLADE SURG 15 STRL LF DISP TIS (BLADE) ×2 IMPLANT
BLADE SURG 15 STRL SS (BLADE) ×3
BNDG CMPR 9X4 STRL LF SNTH (GAUZE/BANDAGES/DRESSINGS)
BNDG COHESIVE 1X5 TAN STRL LF (GAUZE/BANDAGES/DRESSINGS) IMPLANT
BNDG COHESIVE 3X5 TAN STRL LF (GAUZE/BANDAGES/DRESSINGS) IMPLANT
BNDG ESMARK 4X9 LF (GAUZE/BANDAGES/DRESSINGS) IMPLANT
CHLORAPREP W/TINT 26ML (MISCELLANEOUS) ×3 IMPLANT
CLOTH BEACON ORANGE TIMEOUT ST (SAFETY) ×3 IMPLANT
CORDS BIPOLAR (ELECTRODE) ×3 IMPLANT
COVER MAYO STAND STRL (DRAPES) ×3 IMPLANT
COVER TABLE BACK 60X90 (DRAPES) ×3 IMPLANT
CUFF TOURNIQUET SINGLE 18IN (TOURNIQUET CUFF) IMPLANT
DECANTER SPIKE VIAL GLASS SM (MISCELLANEOUS) IMPLANT
DRAIN PENROSE 1/2X12 LTX STRL (WOUND CARE) IMPLANT
DRAPE EXTREMITY T 121X128X90 (DRAPE) ×3 IMPLANT
DRAPE SURG 17X23 STRL (DRAPES) ×3 IMPLANT
GAUZE XEROFORM 1X8 LF (GAUZE/BANDAGES/DRESSINGS) ×3 IMPLANT
GLOVE BIO SURGEON STRL SZ 6.5 (GLOVE) ×3 IMPLANT
GLOVE SURG ORTHO 8.0 STRL STRW (GLOVE) ×3 IMPLANT
GOWN BRE IMP PREV XXLGXLNG (GOWN DISPOSABLE) ×4 IMPLANT
GOWN PREVENTION PLUS XLARGE (GOWN DISPOSABLE) ×4 IMPLANT
NEEDLE 27GAX1X1/2 (NEEDLE) IMPLANT
NS IRRIG 1000ML POUR BTL (IV SOLUTION) ×3 IMPLANT
PACK BASIN DAY SURGERY FS (CUSTOM PROCEDURE TRAY) ×3 IMPLANT
PAD CAST 3X4 CTTN HI CHSV (CAST SUPPLIES) IMPLANT
PADDING CAST ABS 3INX4YD NS (CAST SUPPLIES)
PADDING CAST ABS 4INX4YD NS (CAST SUPPLIES) ×1
PADDING CAST ABS COTTON 3X4 (CAST SUPPLIES) IMPLANT
PADDING CAST ABS COTTON 4X4 ST (CAST SUPPLIES) ×2 IMPLANT
PADDING CAST COTTON 3X4 STRL (CAST SUPPLIES)
SPLINT PLASTER CAST XFAST 3X15 (CAST SUPPLIES) IMPLANT
SPLINT PLASTER XTRA FASTSET 3X (CAST SUPPLIES)
SPONGE GAUZE 4X4 12PLY (GAUZE/BANDAGES/DRESSINGS) ×3 IMPLANT
STOCKINETTE 4X48 STRL (DRAPES) ×3 IMPLANT
SUT VIC AB 4-0 P2 18 (SUTURE) IMPLANT
SUT VICRYL RAPID 5 0 P 3 (SUTURE) IMPLANT
SUT VICRYL RAPIDE 4/0 PS 2 (SUTURE) ×3 IMPLANT
SYR BULB 3OZ (MISCELLANEOUS) ×3 IMPLANT
SYR CONTROL 10ML LL (SYRINGE) IMPLANT
TOWEL OR 17X24 6PK STRL BLUE (TOWEL DISPOSABLE) ×6 IMPLANT
UNDERPAD 30X30 INCONTINENT (UNDERPADS AND DIAPERS) ×3 IMPLANT
WATER STERILE IRR 1000ML POUR (IV SOLUTION) ×3 IMPLANT

## 2011-02-23 NOTE — Anesthesia Procedure Notes (Addendum)
Performed by: Radford Pax   Procedure Name: MAC Date/Time: 02/23/2011 11:30 AM Performed by: Radford Pax Pre-anesthesia Checklist: Patient identified, Emergency Drugs available, Suction available, Patient being monitored and Timeout performed

## 2011-02-23 NOTE — Brief Op Note (Signed)
02/23/2011  11:55 AM  PATIENT:  Nancy Nancy  70 y.o. female  PRE-OPERATIVE DIAGNOSIS:  trigger finger, cyst right little finger  POST-OPERATIVE DIAGNOSIS:  * same *  PROCEDURE:  Procedure(s): RELEASE TRIGGER FINGER/A-1 PULLEY CYST REMOVAL excision ulnar slip FDS  SURGEON:  Surgeon(s): Nicki Reaper, MD Tami Ribas  PHYSICIAN ASSISTANT:   ASSISTANTS: Karlyn Agee   ANESTHESIA:   local and regional  EBL:     BLOOD ADMINISTERED:none  DRAINS: none   LOCAL MEDICATIONS USED:  MARCAINE 4CC  SPECIMEN:  Excision  DISPOSITION OF SPECIMEN:  PATHOLOGY  COUNTS:  YES  TOURNIQUET:   Total Tourniquet Time Documented: Forearm (Right) - 18 minutes  DICTATION: .Note written in EPIC  PLAN OF CARE: Discharge to home after PACU  PATIENT DISPOSITION:  PACU - hemodynamically stable.

## 2011-02-23 NOTE — Anesthesia Preprocedure Evaluation (Signed)
Anesthesia Evaluation  Patient identified by MRN, date of birth, ID band Patient awake    Reviewed: Allergy & Precautions, H&P , Patient's Chart, lab work & pertinent test results  History of Anesthesia Complications (+) PONV and Family history of anesthesia reaction  Airway Mallampati: I  Neck ROM: Full    Dental  (+) Teeth Intact   Pulmonary neg pulmonary ROS,  clear to auscultation        Cardiovascular hypertension, Regular Normal    Neuro/Psych    GI/Hepatic GERD-  ,  Endo/Other    Renal/GU      Musculoskeletal   Abdominal   Peds  Hematology   Anesthesia Other Findings   Reproductive/Obstetrics                           Anesthesia Physical Anesthesia Plan  ASA: II  Anesthesia Plan: Regional   Post-op Pain Management:    Induction: Intravenous  Airway Management Planned: Natural Airway and Simple Face Mask  Additional Equipment:   Intra-op Plan:   Post-operative Plan:   Informed Consent: I have reviewed the patients History and Physical, chart, labs and discussed the procedure including the risks, benefits and alternatives for the proposed anesthesia with the patient or authorized representative who has indicated his/her understanding and acceptance.     Plan Discussed with: CRNA and Surgeon  Anesthesia Plan Comments:         Anesthesia Quick Evaluation

## 2011-02-23 NOTE — Op Note (Signed)
DESCRIPTION OF PROCEDURE:  The patient was brought to the operating room where a forearm based IV regional anesthetic was carried out without difficulty.  She was prepped using ChloraPrep, supine position with the right arm free.  A 3-minute dry time was allowed.  Time-out taken confirming patient procedure.  A transverse incision was made over the A1 pulley of the right little finger and carried down through subcutaneous tissue. Bleeders were electrocauterized.  The A1 pulley was identified. Retractors placed protecting neurovascular bundles.  Significant thickening of the A1 pulley was noted.  A proximal tenosynovitis was also present. The A1 pulley was released on its radial aspect. The superficialis tendonon the ulna side had a very significant synovitis present and this was resected. A flexor sheath cyst was removed and sent to pathology.   No further triggering was noted with full flexion extension.   The wound was irrigated to each finger and closed with interrupted 4-0 Vicryl Rapide sutures, local infiltration with 0.25% Marcaine without epinephrine was given to each of the digits.  Total of 8 mL was used. A sterile compressive dressing was applied.  On deflation of the tourniquet, all fingers immediately pinked.  She was taken to the recovery room for observation in satisfactory condition.

## 2011-02-23 NOTE — H&P (Signed)
HISTORY:  Nancy Baxter comes to the office today with a problem with her right little finger.  She is unable to straighten it. She has a mass in the palm.  This has been present for at least two weeks.  Nancy Baxter is her husband.   She has no history of injury, she complains of intermittent, moderate to severe, sharp pain. She is unable to straighten her fingers. She states it is getting progressively worse. She has history of arthritis, no history of diabetes, thyroid problems or gout.   Nancy Baxter is an 70 y.o. female.   Chief Complaint: sts rlf HPI: see above  Past Medical History  Diagnosis Date  . Complication of anesthesia   . PONV (postoperative nausea and vomiting)   . Hypertension   . GERD (gastroesophageal reflux disease)   . Arthritis     Past Surgical History  Procedure Date  . Back surgery 1961    spinal fusion age   . Appendectomy   . Abdominal hysterectomy   . Tonsillectomy   . Joint replacement 2006    rt hip  . Joint replacement 2008    lt hip  . Foot arthrodesis 5/12    hammer toes,bunionectomy    History reviewed. No pertinent family history. Social History:  reports that she has never smoked. She does not have any smokeless tobacco history on file. She reports that she does not drink alcohol or use illicit drugs.  Allergies:  Allergies  Allergen Reactions  . Adhesive (Tape)     blisters  . Ambien     sleepwalks  . Codeine Nausea And Vomiting  . Prednisone     Shuts down adrenals  . Sulfa Antibiotics Rash    Medications Prior to Admission  Medication Dose Route Frequency Provider Last Rate Last Dose  . lactated ringers infusion   Intravenous Continuous Zenon Mayo, MD 20 mL/hr at 02/23/11 [phone]    . vancomycin (VANCOCIN) IVPB 1000 mg/200 mL premix  1,000 mg Intravenous 60 min Pre-Op Nicki Reaper, MD       No current outpatient prescriptions on file as of 02/23/2011.    Results for orders placed during the hospital encounter of  02/23/11 (from the past 48 hour(s))  BASIC METABOLIC PANEL     Status: Abnormal   Collection Time   02/22/11  3:30 PM      Component Value Range Comment   Sodium 138  135 - 145 (mEq/L)    Potassium 3.9  3.5 - 5.1 (mEq/L)    Chloride 101  96 - 112 (mEq/L)    CO2 25  19 - 32 (mEq/L)    Glucose, Bld 98  70 - 99 (mg/dL)    BUN 31 (*) 6 - 23 (mg/dL)    Creatinine, Ser 9.60 (*) 0.50 - 1.10 (mg/dL)    Calcium 45.4  8.4 - 10.5 (mg/dL)    GFR calc non Af Amer 39 (*) >90 (mL/min)    GFR calc Af Amer 45 (*) >90 (mL/min)   POCT HEMOGLOBIN-HEMACUE     Status: Abnormal   Collection Time   02/23/11 10:03 AM      Component Value Range Comment   Hemoglobin 16.0 (*) 12.0 - 15.0 (g/dL)     No results found.   A comprehensive review of systems was negative.  Blood pressure 130/79, pulse 68, temperature 97.5 F (36.4 C), temperature source Oral, resp. rate 20, height 5' 0.25" (1.53 m), weight 67.586 kg (149 lb), SpO2  98.00%.  General appearance: alert, cooperative and appears stated age Head: Normocephalic, without obvious abnormality, atraumatic Neck: no adenopathy Resp: clear to auscultation bilaterally Cardio: regular rate and rhythm, S1, S2 normal, no murmur, click, rub or gallop GI: soft, non-tender; bowel sounds normal; no masses,  no organomegaly Extremities: extremities normal, atraumatic, no cyanosis or edema Pulses: 2+ and symmetric Skin: Skin color, texture, turgor normal. No rashes or lesions Neurologic: Grossly normal Incision/Wound: na  Assessment/Plan Release ai pulley rlf  Oscar Forman R 02/23/2011, 10:31 AM

## 2011-02-23 NOTE — Anesthesia Postprocedure Evaluation (Signed)
  Anesthesia Post-op Note  Patient: Nancy Baxter  Procedure(s) Performed:  RELEASE TRIGGER FINGER/A-1 PULLEY; CYST REMOVAL  Patient Location: PACU  Anesthesia Type: Bier block  Level of Consciousness: awake  Airway and Oxygen Therapy: Patient Spontanous Breathing  Post-op Pain: none  Post-op Assessment: Post-op Vital signs reviewed  Post-op Vital Signs: stable  Complications: No apparent anesthesia complications

## 2011-02-23 NOTE — Transfer of Care (Signed)
Immediate Anesthesia Transfer of Care Note  Patient: Nancy Baxter  Procedure(s) Performed:  RELEASE TRIGGER FINGER/A-1 PULLEY; CYST REMOVAL  Patient Location: PACU  Anesthesia Type: MAC  Level of Consciousness: awake, alert  and oriented  Airway & Oxygen Therapy: Patient Spontanous Breathing and Patient connected to face mask oxygen  Post-op Assessment: Report given to PACU RN  Post vital signs: stable  Complications: No apparent anesthesia complications

## 2011-02-27 ENCOUNTER — Encounter (HOSPITAL_BASED_OUTPATIENT_CLINIC_OR_DEPARTMENT_OTHER): Payer: Self-pay | Admitting: Orthopedic Surgery

## 2011-06-18 ENCOUNTER — Encounter: Payer: Self-pay | Admitting: Internal Medicine

## 2011-08-01 ENCOUNTER — Other Ambulatory Visit: Payer: Self-pay | Admitting: Orthopedic Surgery

## 2011-08-01 MED ORDER — BUPIVACAINE LIPOSOME 1.3 % IJ SUSP: 20.0000 mL | Freq: Once | INTRAMUSCULAR | Status: DC

## 2011-08-10 ENCOUNTER — Encounter: Payer: Medicare Other | Admitting: Internal Medicine

## 2011-08-16 ENCOUNTER — Telehealth: Payer: Self-pay | Admitting: *Deleted

## 2011-08-16 NOTE — Telephone Encounter (Signed)
Spoke with Husband. Wife must have forgottened the previsit appt.  He will have her call and [facility] previsit appt.

## 2011-08-18 ENCOUNTER — Encounter: Payer: Self-pay | Admitting: Internal Medicine

## 2011-08-18 ENCOUNTER — Ambulatory Visit (AMBULATORY_SURGERY_CENTER): Payer: Medicare Other | Admitting: *Deleted

## 2011-08-18 VITALS — Ht 60.25 in | Wt 152.8 lb

## 2011-08-18 DIAGNOSIS — Z860100 Personal history of colon polyps, unspecified: Secondary | ICD-10-CM

## 2011-08-18 DIAGNOSIS — Z1211 Encounter for screening for malignant neoplasm of colon: Secondary | ICD-10-CM

## 2011-08-18 MED ORDER — PEG-KCL-NACL-NASULF-NA ASC-C 100 G PO SOLR: 1.0000 | Freq: Once | ORAL | Status: DC

## 2011-08-25 ENCOUNTER — Ambulatory Visit (AMBULATORY_SURGERY_CENTER): Payer: Medicare Other | Admitting: Internal Medicine

## 2011-08-25 ENCOUNTER — Encounter: Payer: Self-pay | Admitting: Internal Medicine

## 2011-08-25 VITALS — BP 127/70 | HR 69 | Temp 98.0°F | Resp 18 | Ht 60.25 in | Wt 152.0 lb

## 2011-08-25 DIAGNOSIS — Z86010 Personal history of colonic polyps: Secondary | ICD-10-CM

## 2011-08-25 DIAGNOSIS — Z1211 Encounter for screening for malignant neoplasm of colon: Secondary | ICD-10-CM

## 2011-08-25 DIAGNOSIS — D126 Benign neoplasm of colon, unspecified: Secondary | ICD-10-CM

## 2011-08-25 MED ORDER — SODIUM CHLORIDE 0.9 % IV SOLN: 500.0000 mL | INTRAVENOUS | Status: DC

## 2011-08-25 NOTE — Op Note (Signed)
[facility] [address] [place], [place]  [address]  COLONOSCOPY PROCEDURE REPORT  PATIENT:  Nancy, Baxter  MR#:  [id number] BIRTHDATE:  1941/02/16, 70 yrs. old  GENDER:  female ENDOSCOPIST:  Wilhemina Bonito. Eda Keys, MD REF. BY:  Surveillance Program Recall, PROCEDURE DATE:  08/25/2011 PROCEDURE:  Colonoscopy with snare polypectomy x 1 ASA CLASS:  Class II INDICATIONS:  history of pre-cancerous (adenomatous) colon polyps, surveillance and high-risk screening ; index 02-2006 w/ small TA MEDICATIONS:   MAC sedation, administered by CRNA, propofol (Diprivan) 300 mg IV  DESCRIPTION OF PROCEDURE:   After the risks benefits and alternatives of the procedure were thoroughly explained, informed consent was obtained.  Digital rectal exam was performed and revealed no abnormalities.   The LB CF-[id number] endoscope was introduced through the anus and advanced to the cecum, which was identified by both the appendix and ileocecal valve, without limitations.  The quality of the prep was excellent, using MoviPrep.  The instrument was then slowly withdrawn as the colon was fully examined. <<PROCEDUREIMAGES>>  FINDINGS:  A 2mm polyp was found in the ascending colon and snared without cautery. No meaningful tissue available for pathologic submission. Otherwise normal colonoscopy without other polyps, masses, vascular ectasias, or inflammatory changes.   Retroflexed views in the rectum revealed no abnormalities.    The time to cecum = 3  minutes. The scope was then withdrawn in 9:54  minutes from the cecum and the procedure completed.  COMPLICATIONS:  None  ENDOSCOPIC IMPRESSION: 1) Diminutive polyp in the ascending colon - removed 2) Otherwise normal colonoscopy  RECOMMENDATIONS: 1) Follow up colonoscopy in 5 years (personal hx adenomatous polyp)  ______________________________ Wilhemina Bonito. Eda Keys, MD  CC:  Creola Corn, MD;  The Patient  n. eSIGNED:   Wilhemina Bonito. Eda Keys at  08/25/2011 10:23 AM  Jefm Petty, [phone]

## 2011-08-25 NOTE — Progress Notes (Signed)
Patient did not experience any of the following events: a burn prior to discharge; a fall within the facility; wrong site/side/patient/procedure/implant event; or a hospital transfer or hospital admission upon discharge from the facility. (G8907) Patient did not have preoperative order for IV antibiotic SSI prophylaxis. (G8918)  

## 2011-08-25 NOTE — Patient Instructions (Signed)

## 2011-08-26 ENCOUNTER — Telehealth: Payer: Self-pay | Admitting: *Deleted

## 2011-08-26 NOTE — Telephone Encounter (Signed)
  Follow up Call-  Call back number 08/25/2011  Post procedure Call Back phone  # [phone]  Permission to leave phone message Yes     Patient questions:  Do you have a fever, pain , or abdominal swelling? no Pain Score  0 *  Have you tolerated food without any problems? yes  Have you been able to return to your normal activities? yes  Do you have any questions about your discharge instructions: Diet   no Medications  no Follow up visit  no  Do you have questions or concerns about your Care? no  Actions: * If pain score is 4 or above: No action needed, pain <4.

## 2011-08-30 ENCOUNTER — Encounter: Payer: Medicare Other | Admitting: Internal Medicine

## 2011-10-12 ENCOUNTER — Other Ambulatory Visit: Payer: Self-pay | Admitting: Dermatology

## 2011-10-20 ENCOUNTER — Ambulatory Visit (HOSPITAL_COMMUNITY): Admission: RE | Admit: 2011-10-20 | Payer: Medicare Other | Source: Ambulatory Visit | Admitting: Orthopedic Surgery

## 2011-10-20 ENCOUNTER — Encounter (HOSPITAL_COMMUNITY): Admission: RE | Payer: Self-pay | Source: Ambulatory Visit

## 2011-10-20 SURGERY — TOTAL HIP REVISION
Anesthesia: Choice | Laterality: Left

## 2012-01-03 ENCOUNTER — Other Ambulatory Visit: Payer: Self-pay | Admitting: Orthopedic Surgery

## 2012-01-03 ENCOUNTER — Encounter (HOSPITAL_COMMUNITY): Payer: Self-pay | Admitting: *Deleted

## 2012-01-03 MED ORDER — BUPIVACAINE LIPOSOME 1.3 % IJ SUSP: 20.0000 mL | Freq: Once | INTRAMUSCULAR | Status: DC

## 2012-01-04 ENCOUNTER — Other Ambulatory Visit: Payer: Self-pay | Admitting: Orthopedic Surgery

## 2012-01-04 ENCOUNTER — Encounter (HOSPITAL_COMMUNITY): Payer: Self-pay | Admitting: Pharmacy Technician

## 2012-01-04 NOTE — Progress Notes (Signed)
Patient states on preop  Phone call  That she usually has scolopamine patch placed behind ear prior to surgery.

## 2012-01-04 NOTE — Progress Notes (Signed)
Patient denies on preop phone call of having any ear cyst excision.

## 2012-01-04 NOTE — H&P (Signed)
Nancy Baxter  DOB: 07/30/1940 Married / Language: Undefined / Race: White Female  Date of Admission:  01/05/2012  Chief Complaint: Left Hip pain  History of Present Illness The patient is a 71 year old female who comes in for a preoperative History and Physical. The patient is scheduled for a 01/05/2012 hip poly revision versus acetabular revision to be performed by Dr. Frank V. Aluisio, MD at [facility] on 01/05/2012. The patient is being followed for their left hip pain. They are 7 year(s) out from total hip arthroplasty. Symptoms reported today include: pain, popping and grinding. The patient feels that they are doing poorly and report their pain level to be moderate. The patient presents today following MRI. She had the [facility] MRI. She continues with the squeaking type sensation and with the pain in the lateral hip. She does get some groin pain also. All of these symptoms began after she had that fall and they have gotten progressively worse over time. I really think she may have damaged the bearing surface or damaged the interface at the taper between the femoral head and neck. Either way things are getting worse. It is recommended that she doing a bearing exchange. There is also a possible need to revise the acetabular component, as it has become more vertical over time. We discussed all of this in detail. Minimum would be bearing surface change, maximum would be revision. She understands the clinical scenarios and we will elect to proceed with surgical treatment. They have been treated conservatively in the past for the above stated problem and despite conservative measures, they continue to have progressive pain and severe functional limitations and dysfunction. They have failed non-operative management. It is felt that they would benefit from undergoing total joint replacement. Risks and benefits of the procedure have been discussed with the patient and they elect  to proceed with surgery. There are no active contraindications to surgery such as ongoing infection or rapidly progressive neurological disease.   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 Nausea, Vomiting. Sulfa Drugs. Whelps Steroids. 02/26/2009 "shuts down adrenal glands" Ambien *HYPNOTICS*. caused sleepwalking CELEBREX. 04/15/1997 whelps   Family History Heart Disease. father Drug / Alcohol Addiction. father Cancer. mother Depression. sister Congestive Heart Failure. father Hypertension. mother Osteoarthritis. mother Osteoporosis. mother Diabetes Mellitus. mother Heart disease in female family member before age 55   Social History Children. 2 Alcohol use. current drinker; drinks wine; only occasionally per week Copy of Drug/Alcohol Rehab (Previously). no Drug/Alcohol Rehab (Currently). no Current work status. retired Exercise. Exercises rarely; does running / walking Tobacco use. Never smoker. never smoker Tobacco / smoke exposure. no Living situation. live with spouse Illicit drug use. no Marital status. married Pain Contract. no Number of flights of stairs before winded. 2-3 Post-Surgical Plans. Plan is to go home. Advance Directives. Living Will, Healthcare POA   Medication History Toprol XL (50MG Tablet ER, Oral) Active. Cozaar (50MG Tablet, Oral) Active. Triamterene/HCTZ (37.5-25MG Capsule, Oral) Active. Requip XL (2MG Tablet ER 24HR, Oral) Active. PriLOSEC OTC (20MG Tablet DR, Oral) Active. Aspirin EC (81MG Tablet DR, Oral) Active. Aleve (220MG Tablet, Oral) Active. Caltrate 600+D Plus (600-800MG-UNIT Tablet Chewable, Oral) Active.   Pregnancy / Birth History Pregnant. no   Past Surgical History Foot Surgery. left Hysterectomy. partial (non-cancerous) Total Hip Replacement.  bilateral Cataract Surgery. bilateral Spinal Fusion. lower back Spinal Surgery Tonsillectomy  Medical History Heart murmur Hypertension   Gastroesophageal Reflux Disease Cataract Menopause Measles Mumps Rubella Scarlet Fever. Childhood Illness   Review of Systems General:Not Present- Chills, Fever, Night Sweats, Fatigue, Weight Gain, Weight Loss and Memory Loss. Skin:Not Present- Hives, Itching, Rash, Eczema and Lesions. HEENT:Not Present- Tinnitus, Headache, Double Vision, Visual Loss, Hearing Loss and Dentures. Respiratory:Not Present- Shortness of breath with exertion, Shortness of breath at rest, Allergies, Coughing up blood and Chronic Cough. Cardiovascular:Not Present- Chest Pain, Racing/skipping heartbeats, Difficulty Breathing Lying Down, Murmur, Swelling and Palpitations. Gastrointestinal:Not Present- Bloody Stool, Heartburn, Abdominal Pain, Vomiting, Nausea, Constipation, Diarrhea, Difficulty Swallowing, Jaundice and Loss of appetitie. Female Genitourinary:Present- Urinating at Night. Not Present- Blood in Urine, Urinary frequency, Weak urinary stream, Discharge, Flank Pain, Incontinence, Painful Urination, Urgency and Urinary Retention. Musculoskeletal:Present- Joint Pain, Back Pain and Morning Stiffness. Not Present- Muscle Weakness, Muscle Pain, Joint Swelling and Spasms. Neurological:Not Present- Tremor, Dizziness, Blackout spells, Paralysis, Difficulty with balance and Weakness. Psychiatric:Not Present- Insomnia.   Vitals Weight: 149 lb Height: 60 in Weight was reported by patient. Height was reported by patient. Body Surface Area: 1.69 m Body Mass Index: 29.1 kg/m Pulse: 76 (Regular) Resp.: 12 (Unlabored) BP: 108/60 (Sitting, Right Arm, Standard)    Physical Exam The physical exam findings are as follows:  Note: Patient is a 71 year old female with continued hip pain.   General Mental Status - Alert, cooperative and good  historian. General Appearance- pleasant. Not in acute distress. Orientation- Oriented X3. Build & Nutrition- Well nourished and Well developed.   Head and Neck Head- normocephalic, atraumatic . Neck Global Assessment- supple. no bruit auscultated on the right and no bruit auscultated on the left.   Eye Pupil- Bilateral- Regular and Round. Motion- Bilateral- EOMI.   Chest and Lung Exam Auscultation: Breath sounds:- clear at anterior chest wall and - clear at posterior chest wall. Adventitious sounds:- No Adventitious sounds.   Cardiovascular Auscultation:Rhythm- Regular rate and rhythm. Heart Sounds- S1 WNL and S2 WNL. Murmurs & Other Heart Sounds: Murmur 1:Location- Aortic Area. Timing- Holosystolic. Grade- III/VI. Character- Holosystolic and Medium pitched.   Abdomen Palpation/Percussion:Tenderness- Abdomen is non-tender to palpation. Rigidity (guarding)- Abdomen is soft. Auscultation:Auscultation of the abdomen reveals - Bowel sounds normal.   Female Genitourinary Not done, not pertinent to present illness  Musculoskeletal On exam, she's alert and oriented, in no apparent distress. Left hip can be flexed to 120, rotated in 30, out 40, abducted 40 without discomfort on ROM. There is some lateral tenderness.  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 Aluisio.  Plan is to go home.  PCP - Dr. John Russo - Patient has been seen preoperatively and felt to be stable for surgery.  Signed electronically by DREW L Mane Consolo, PA-C  

## 2012-01-05 ENCOUNTER — Inpatient Hospital Stay (HOSPITAL_COMMUNITY)
Admission: RE | Admit: 2012-01-05 | Discharge: 2012-01-07 | DRG: 467 | Disposition: A | Discharge status: Home Health | Payer: Medicare Other | Source: Ambulatory Visit | Attending: Orthopedic Surgery | Admitting: Orthopedic Surgery

## 2012-01-05 ENCOUNTER — Ambulatory Visit (HOSPITAL_COMMUNITY): Payer: Medicare Other | Admitting: Anesthesiology

## 2012-01-05 ENCOUNTER — Ambulatory Visit (HOSPITAL_COMMUNITY): Payer: Medicare Other

## 2012-01-05 ENCOUNTER — Inpatient Hospital Stay (HOSPITAL_COMMUNITY): Payer: Medicare Other

## 2012-01-05 ENCOUNTER — Encounter (HOSPITAL_COMMUNITY): Payer: Self-pay | Admitting: Anesthesiology

## 2012-01-05 ENCOUNTER — Encounter (HOSPITAL_COMMUNITY)
Admission: RE | Disposition: A | Discharge status: Home Health | Payer: Self-pay | Source: Ambulatory Visit | Attending: Orthopedic Surgery

## 2012-01-05 ENCOUNTER — Encounter (HOSPITAL_COMMUNITY): Payer: Self-pay | Admitting: *Deleted

## 2012-01-05 DIAGNOSIS — Z9289 Personal history of other medical treatment: Secondary | ICD-10-CM

## 2012-01-05 DIAGNOSIS — E871 Hypo-osmolality and hyponatremia: Secondary | ICD-10-CM | POA: Diagnosis present

## 2012-01-05 DIAGNOSIS — K219 Gastro-esophageal reflux disease without esophagitis: Secondary | ICD-10-CM | POA: Diagnosis present

## 2012-01-05 DIAGNOSIS — Z87898 Personal history of other specified conditions: Secondary | ICD-10-CM

## 2012-01-05 DIAGNOSIS — I10 Essential (primary) hypertension: Secondary | ICD-10-CM | POA: Diagnosis present

## 2012-01-05 DIAGNOSIS — Y831 Surgical operation with implant of artificial internal device as the cause of abnormal reaction of the patient, or of later complication, without mention of misadventure at the time of the procedure: Secondary | ICD-10-CM | POA: Diagnosis present

## 2012-01-05 DIAGNOSIS — M76899 Other specified enthesopathies of unspecified lower limb, excluding foot: Secondary | ICD-10-CM | POA: Diagnosis present

## 2012-01-05 DIAGNOSIS — Z96649 Presence of unspecified artificial hip joint: Secondary | ICD-10-CM

## 2012-01-05 DIAGNOSIS — Z79899 Other long term (current) drug therapy: Secondary | ICD-10-CM

## 2012-01-05 DIAGNOSIS — D62 Acute posthemorrhagic anemia: Secondary | ICD-10-CM | POA: Diagnosis not present

## 2012-01-05 DIAGNOSIS — T84099A Other mechanical complication of unspecified internal joint prosthesis, initial encounter: Principal | ICD-10-CM | POA: Diagnosis present

## 2012-01-05 DIAGNOSIS — T84018A Broken internal joint prosthesis, other site, initial encounter: Secondary | ICD-10-CM

## 2012-01-05 HISTORY — DX: Anemia, unspecified: D64.9

## 2012-01-05 HISTORY — PX: TOTAL HIP REVISION: SHX763

## 2012-01-05 LAB — CBC
HCT: 42.3 % (ref 36.0–46.0)
Hemoglobin: 14.9 g/dL (ref 12.0–15.0)
MCH: 28.3 pg (ref 26.0–34.0)
MCV: 80.3 fL (ref 78.0–100.0)
RBC: 5.27 MIL/uL — ABNORMAL HIGH (ref 3.87–5.11)

## 2012-01-05 LAB — URINALYSIS, ROUTINE W REFLEX MICROSCOPIC
Bilirubin Urine: NEGATIVE
Ketones, ur: NEGATIVE mg/dL
Nitrite: NEGATIVE
Specific Gravity, Urine: 1.02 (ref 1.005–1.030)
Urobilinogen, UA: 0.2 mg/dL (ref 0.0–1.0)

## 2012-01-05 LAB — COMPREHENSIVE METABOLIC PANEL
ALT: 11 U/L (ref 0–35)
BUN: 26 mg/dL — ABNORMAL HIGH (ref 6–23)
CO2: 25 mEq/L (ref 19–32)
Calcium: 9.8 mg/dL (ref 8.4–10.5)
Creatinine, Ser: 1.17 mg/dL — ABNORMAL HIGH (ref 0.50–1.10)
GFR calc Af Amer: 53 mL/min — ABNORMAL LOW (ref >90)
GFR calc non Af Amer: 46 mL/min — ABNORMAL LOW (ref >90)
Glucose, Bld: 95 mg/dL (ref 70–99)
Sodium: 133 mEq/L — ABNORMAL LOW (ref 135–145)

## 2012-01-05 LAB — PROTIME-INR: Prothrombin Time: 12.7 seconds (ref 11.6–15.2)

## 2012-01-05 LAB — URINE MICROSCOPIC-ADD ON

## 2012-01-05 SURGERY — TOTAL HIP REVISION
Anesthesia: General | Site: Hip | Laterality: Left | Wound class: Clean

## 2012-01-05 MED ORDER — BUPIVACAINE LIPOSOME 1.3 % IJ SUSP
20.0000 mL | INTRAMUSCULAR | Status: AC
  Administered 2012-01-05: 20 mL
  Filled 2012-01-05: qty 20

## 2012-01-05 MED ORDER — HETASTARCH-ELECTROLYTES 6 % IV SOLN
INTRAVENOUS | Status: DC | PRN
  Administered 2012-01-05: 09:00:00 via INTRAVENOUS

## 2012-01-05 MED ORDER — LOSARTAN POTASSIUM 50 MG PO TABS
50.0000 mg | ORAL_TABLET | Freq: Every morning | ORAL | Status: DC
  Filled 2012-01-05 (×4): qty 1

## 2012-01-05 MED ORDER — PANTOPRAZOLE SODIUM 40 MG PO TBEC
40.0000 mg | DELAYED_RELEASE_TABLET | Freq: Every day | ORAL | Status: DC
  Filled 2012-01-05: qty 1

## 2012-01-05 MED ORDER — KCL IN DEXTROSE-NACL 20-5-0.9 MEQ/L-%-% IV SOLN
INTRAVENOUS | Status: DC
  Administered 2012-01-05 – 2012-01-06 (×3): via INTRAVENOUS
  Filled 2012-01-05 (×5): qty 1000

## 2012-01-05 MED ORDER — ACETAMINOPHEN 10 MG/ML IV SOLN
1000.0000 mg | Freq: Once | INTRAVENOUS | Status: AC
  Administered 2012-01-05: 1000 mg via INTRAVENOUS

## 2012-01-05 MED ORDER — KCL IN DEXTROSE-NACL 20-5-0.9 MEQ/L-%-% IV SOLN
INTRAVENOUS | Status: AC
  Administered 2012-01-05: 1000 mL
  Filled 2012-01-05: qty 1000

## 2012-01-05 MED ORDER — DIPHENHYDRAMINE HCL 12.5 MG/5ML PO ELIX: 12.5000 mg | ORAL_SOLUTION | ORAL | Status: DC | PRN

## 2012-01-05 MED ORDER — FENTANYL CITRATE 0.05 MG/ML IJ SOLN
INTRAMUSCULAR | Status: DC | PRN
  Administered 2012-01-05 (×3): 50 ug via INTRAVENOUS
  Administered 2012-01-05: 25 ug via INTRAVENOUS
  Administered 2012-01-05 (×3): 50 ug via INTRAVENOUS
  Administered 2012-01-05: 25 ug via INTRAVENOUS
  Administered 2012-01-05: 50 ug via INTRAVENOUS

## 2012-01-05 MED ORDER — ROCURONIUM BROMIDE 100 MG/10ML IV SOLN
INTRAVENOUS | Status: DC | PRN
  Administered 2012-01-05: 50 mg via INTRAVENOUS

## 2012-01-05 MED ORDER — ONDANSETRON HCL 4 MG PO TABS: 4.0000 mg | ORAL_TABLET | Freq: Four times a day (QID) | ORAL | Status: DC | PRN

## 2012-01-05 MED ORDER — TRIAMTERENE-HCTZ 37.5-25 MG PO TABS
0.5000 | ORAL_TABLET | Freq: Every morning | ORAL | Status: DC
  Filled 2012-01-05 (×3): qty 0.5

## 2012-01-05 MED ORDER — ACETAMINOPHEN 650 MG RE SUPP: 650.0000 mg | Freq: Four times a day (QID) | RECTAL | Status: DC | PRN

## 2012-01-05 MED ORDER — ROPINIROLE HCL ER 2 MG PO TB24: 2.0000 mg | ORAL_TABLET | Freq: Once | ORAL | Status: DC

## 2012-01-05 MED ORDER — MORPHINE SULFATE 2 MG/ML IJ SOLN: 1.0000 mg | INTRAMUSCULAR | Status: DC | PRN

## 2012-01-05 MED ORDER — HYDROMORPHONE HCL PF 1 MG/ML IJ SOLN: 0.2500 mg | INTRAMUSCULAR | Status: DC | PRN

## 2012-01-05 MED ORDER — ONDANSETRON HCL 4 MG/2ML IJ SOLN
INTRAMUSCULAR | Status: DC | PRN
  Administered 2012-01-05: 4 mg via INTRAVENOUS

## 2012-01-05 MED ORDER — ACETAMINOPHEN 10 MG/ML IV SOLN
1000.0000 mg | Freq: Four times a day (QID) | INTRAVENOUS | Status: AC
  Administered 2012-01-05 – 2012-01-06 (×4): 1000 mg via INTRAVENOUS
  Filled 2012-01-05 (×5): qty 100

## 2012-01-05 MED ORDER — LIDOCAINE HCL (CARDIAC) 20 MG/ML IV SOLN
INTRAVENOUS | Status: DC | PRN
  Administered 2012-01-05: 50 mg via INTRAVENOUS

## 2012-01-05 MED ORDER — TRAMADOL HCL 50 MG PO TABS: 50.0000 mg | ORAL_TABLET | Freq: Four times a day (QID) | ORAL | Status: DC | PRN

## 2012-01-05 MED ORDER — MUPIROCIN 2 % EX OINT
TOPICAL_OINTMENT | CUTANEOUS | Status: AC
  Administered 2012-01-05: 06:00:00 via NASAL
  Filled 2012-01-05: qty 22

## 2012-01-05 MED ORDER — POLYETHYLENE GLYCOL 3350 17 G PO PACK: 17.0000 g | PACK | Freq: Every day | ORAL | Status: DC | PRN

## 2012-01-05 MED ORDER — 0.9 % SODIUM CHLORIDE (POUR BTL) OPTIME
TOPICAL | Status: DC | PRN
  Administered 2012-01-05: 1000 mL

## 2012-01-05 MED ORDER — HYDROMORPHONE HCL 2 MG PO TABS
2.0000 mg | ORAL_TABLET | ORAL | Status: DC | PRN
  Administered 2012-01-05 – 2012-01-07 (×5): 2 mg via ORAL
  Filled 2012-01-05 (×5): qty 1

## 2012-01-05 MED ORDER — PHENOL 1.4 % MT LIQD
1.0000 | OROMUCOSAL | Status: DC | PRN
  Filled 2012-01-05: qty 177

## 2012-01-05 MED ORDER — ACETAMINOPHEN 325 MG PO TABS
650.0000 mg | ORAL_TABLET | Freq: Four times a day (QID) | ORAL | Status: DC | PRN
  Administered 2012-01-06: 650 mg via ORAL
  Filled 2012-01-05 (×2): qty 2

## 2012-01-05 MED ORDER — METOCLOPRAMIDE HCL 10 MG PO TABS: 5.0000 mg | ORAL_TABLET | Freq: Three times a day (TID) | ORAL | Status: DC | PRN

## 2012-01-05 MED ORDER — MEPERIDINE HCL 50 MG/ML IJ SOLN: 6.2500 mg | INTRAMUSCULAR | Status: DC | PRN

## 2012-01-05 MED ORDER — HYDROMORPHONE HCL PF 1 MG/ML IJ SOLN
INTRAMUSCULAR | Status: DC | PRN
  Administered 2012-01-05 (×3): 0.5 mg via INTRAVENOUS

## 2012-01-05 MED ORDER — FLEET ENEMA 7-19 GM/118ML RE ENEM: 1.0000 | ENEMA | Freq: Once | RECTAL | Status: AC | PRN

## 2012-01-05 MED ORDER — METHOCARBAMOL 500 MG PO TABS
500.0000 mg | ORAL_TABLET | Freq: Four times a day (QID) | ORAL | Status: DC | PRN
  Administered 2012-01-06 – 2012-01-07 (×3): 500 mg via ORAL
  Filled 2012-01-05 (×3): qty 1

## 2012-01-05 MED ORDER — GLYCOPYRROLATE 0.2 MG/ML IJ SOLN
INTRAMUSCULAR | Status: DC | PRN
  Administered 2012-01-05: .3 mg via INTRAVENOUS
  Administered 2012-01-05: 0.1 mg via INTRAVENOUS

## 2012-01-05 MED ORDER — METOPROLOL SUCCINATE ER 50 MG PO TB24
50.0000 mg | ORAL_TABLET | Freq: Every morning | ORAL | Status: DC
  Filled 2012-01-05 (×2): qty 1

## 2012-01-05 MED ORDER — DEXTROSE 5 % IV SOLN
3.0000 g | INTRAVENOUS | Status: DC
  Filled 2012-01-05: qty 3000

## 2012-01-05 MED ORDER — METHOCARBAMOL 100 MG/ML IJ SOLN
500.0000 mg | Freq: Four times a day (QID) | INTRAVENOUS | Status: DC | PRN
  Administered 2012-01-05 (×2): 500 mg via INTRAVENOUS
  Filled 2012-01-05 (×2): qty 5

## 2012-01-05 MED ORDER — MUPIROCIN 2 % EX OINT: TOPICAL_OINTMENT | Freq: Two times a day (BID) | CUTANEOUS | Status: DC

## 2012-01-05 MED ORDER — DOCUSATE SODIUM 100 MG PO CAPS
100.0000 mg | ORAL_CAPSULE | Freq: Two times a day (BID) | ORAL | Status: DC
  Administered 2012-01-05 – 2012-01-07 (×4): 100 mg via ORAL

## 2012-01-05 MED ORDER — PROMETHAZINE HCL 25 MG/ML IJ SOLN: 6.2500 mg | INTRAMUSCULAR | Status: DC | PRN

## 2012-01-05 MED ORDER — ONDANSETRON HCL 4 MG/2ML IJ SOLN: 4.0000 mg | Freq: Four times a day (QID) | INTRAMUSCULAR | Status: DC | PRN

## 2012-01-05 MED ORDER — SCOPOLAMINE 1 MG/3DAYS TD PT72
MEDICATED_PATCH | TRANSDERMAL | Status: AC
  Filled 2012-01-05: qty 1

## 2012-01-05 MED ORDER — METOCLOPRAMIDE HCL 5 MG/ML IJ SOLN: 5.0000 mg | Freq: Three times a day (TID) | INTRAMUSCULAR | Status: DC | PRN

## 2012-01-05 MED ORDER — LACTATED RINGERS IV SOLN
INTRAVENOUS | Status: DC | PRN
  Administered 2012-01-05 (×3): via INTRAVENOUS

## 2012-01-05 MED ORDER — LACTATED RINGERS IV SOLN: INTRAVENOUS | Status: DC

## 2012-01-05 MED ORDER — CEFAZOLIN SODIUM 1-5 GM-% IV SOLN
1.0000 g | Freq: Four times a day (QID) | INTRAVENOUS | Status: AC
  Administered 2012-01-05 (×2): 1 g via INTRAVENOUS
  Filled 2012-01-05 (×2): qty 50

## 2012-01-05 MED ORDER — ROPINIROLE HCL ER 2 MG PO TB24
2.0000 mg | ORAL_TABLET | Freq: Every evening | ORAL | Status: DC
  Filled 2012-01-05: qty 1

## 2012-01-05 MED ORDER — ACETAMINOPHEN 10 MG/ML IV SOLN
INTRAVENOUS | Status: AC
  Filled 2012-01-05: qty 100

## 2012-01-05 MED ORDER — KCL IN DEXTROSE-NACL 20-5-0.45 MEQ/L-%-% IV SOLN
INTRAVENOUS | Status: AC
  Filled 2012-01-05: qty 1000

## 2012-01-05 MED ORDER — MIDAZOLAM HCL 5 MG/5ML IJ SOLN
INTRAMUSCULAR | Status: DC | PRN
  Administered 2012-01-05: 1 mg via INTRAVENOUS

## 2012-01-05 MED ORDER — BISACODYL 10 MG RE SUPP: 10.0000 mg | Freq: Every day | RECTAL | Status: DC | PRN

## 2012-01-05 MED ORDER — NEOSTIGMINE METHYLSULFATE 1 MG/ML IJ SOLN
INTRAMUSCULAR | Status: DC | PRN
  Administered 2012-01-05: 2 mg via INTRAVENOUS

## 2012-01-05 MED ORDER — EPHEDRINE SULFATE 50 MG/ML IJ SOLN
INTRAMUSCULAR | Status: DC | PRN
  Administered 2012-01-05 (×2): 5 mg via INTRAVENOUS

## 2012-01-05 MED ORDER — PROPOFOL 10 MG/ML IV BOLUS
INTRAVENOUS | Status: DC | PRN
  Administered 2012-01-05: 120 mg via INTRAVENOUS

## 2012-01-05 MED ORDER — SODIUM CHLORIDE 0.9 % IV SOLN: INTRAVENOUS | Status: DC

## 2012-01-05 MED ORDER — CEFAZOLIN SODIUM-DEXTROSE 2-3 GM-% IV SOLR
INTRAVENOUS | Status: AC
  Filled 2012-01-05: qty 50

## 2012-01-05 MED ORDER — RIVAROXABAN 10 MG PO TABS
10.0000 mg | ORAL_TABLET | Freq: Every day | ORAL | Status: DC
  Administered 2012-01-06 – 2012-01-07 (×2): 10 mg via ORAL
  Filled 2012-01-05 (×3): qty 1

## 2012-01-05 MED ORDER — MENTHOL 3 MG MT LOZG
1.0000 | LOZENGE | OROMUCOSAL | Status: DC | PRN
  Filled 2012-01-05: qty 9

## 2012-01-05 MED ORDER — ROPINIROLE HCL ER 2 MG PO TB24
2.0000 mg | ORAL_TABLET | Freq: Every evening | ORAL | Status: DC
  Filled 2012-01-05 (×2): qty 1

## 2012-01-05 MED ORDER — METOCLOPRAMIDE HCL 5 MG/ML IJ SOLN
INTRAMUSCULAR | Status: DC | PRN
  Administered 2012-01-05: 10 mg via INTRAVENOUS

## 2012-01-05 MED ORDER — CHLORHEXIDINE GLUCONATE 4 % EX LIQD
60.0000 mL | Freq: Once | CUTANEOUS | Status: DC
  Filled 2012-01-05: qty 60

## 2012-01-05 MED ORDER — SODIUM CHLORIDE 0.9 % IJ SOLN
INTRAMUSCULAR | Status: DC | PRN
  Administered 2012-01-05: 50 mL

## 2012-01-05 SURGICAL SUPPLY — 64 items
BAG SPEC THK2 15X12 ZIP CLS (MISCELLANEOUS) ×3
BAG ZIPLOCK 12X15 (MISCELLANEOUS) ×6 IMPLANT
BIT DRILL 2.8X128 (BIT) ×2 IMPLANT
BLADE EXTENDED COATED 6.5IN (ELECTRODE) ×2 IMPLANT
BLADE SAW SAG 73X25 THK (BLADE) ×1
BLADE SAW SGTL 73X25 THK (BLADE) ×1 IMPLANT
CATH KIT ON-Q SILVERSOAK 5 (CATHETERS) ×1 IMPLANT
CATH KIT ON-Q SILVERSOAK 5IN (CATHETERS) IMPLANT
CLOTH BEACON ORANGE TIMEOUT ST (SAFETY) ×2 IMPLANT
CONT SPECI 4OZ STER CLIK (MISCELLANEOUS) IMPLANT
DRAPE INCISE IOBAN 66X45 STRL (DRAPES) ×2 IMPLANT
DRAPE ORTHO SPLIT 77X108 STRL (DRAPES) ×4
DRAPE POUCH INSTRU U-SHP 10X18 (DRAPES) ×2 IMPLANT
DRAPE SURG ORHT 6 SPLT 77X108 (DRAPES) ×2 IMPLANT
DRAPE U-SHAPE 47X51 STRL (DRAPES) ×2 IMPLANT
DRSG EMULSION OIL 3X16 NADH (GAUZE/BANDAGES/DRESSINGS) ×2 IMPLANT
DRSG MEPILEX BORDER 4X4 (GAUZE/BANDAGES/DRESSINGS) ×3 IMPLANT
DRSG MEPILEX BORDER 4X8 (GAUZE/BANDAGES/DRESSINGS) ×2 IMPLANT
DURAPREP 26ML APPLICATOR (WOUND CARE) ×2 IMPLANT
ELECT REM PT RETURN 9FT ADLT (ELECTROSURGICAL) ×2
ELECTRODE REM PT RTRN 9FT ADLT (ELECTROSURGICAL) ×1 IMPLANT
ELIMINATOR HOLE APEX DEPUY (Hips) ×1 IMPLANT
EVACUATOR 1/8 PVC DRAIN (DRAIN) ×2 IMPLANT
FACESHIELD LNG OPTICON STERILE (SAFETY) ×12 IMPLANT
GLOVE BIO SURGEON STRL SZ8 (GLOVE) ×2 IMPLANT
GLOVE BIOGEL PI IND STRL 8 (GLOVE) ×2 IMPLANT
GLOVE BIOGEL PI INDICATOR 8 (GLOVE) ×2
GLOVE ECLIPSE 8.0 STRL XLNG CF (GLOVE) ×2 IMPLANT
GLOVE SURG SS PI 6.5 STRL IVOR (GLOVE) ×4 IMPLANT
GOWN STRL NON-REIN LRG LVL3 (GOWN DISPOSABLE) ×4 IMPLANT
GOWN STRL REIN XL XLG (GOWN DISPOSABLE) ×2 IMPLANT
GUIDEWIRE 3.0X38 (WIRE) ×1 IMPLANT
HIP BALL 32MM +9 (Hips) ×1 IMPLANT
IMMOBILIZER KNEE 20 (SOFTGOODS) ×2
IMMOBILIZER KNEE 20 THIGH 36 (SOFTGOODS) IMPLANT
KIT BASIN OR (CUSTOM PROCEDURE TRAY) ×2 IMPLANT
LINER MARATHON NEUT +4X52X32 (Hips) ×1 IMPLANT
MANIFOLD NEPTUNE II (INSTRUMENTS) ×2 IMPLANT
NDL SAFETY ECLIPSE 18X1.5 (NEEDLE) IMPLANT
NEEDLE HYPO 18GX1.5 SHARP (NEEDLE)
NS IRRIG 1000ML POUR BTL (IV SOLUTION) ×2 IMPLANT
PACK TOTAL JOINT (CUSTOM PROCEDURE TRAY) ×2 IMPLANT
PASSER SUT SWANSON 36MM LOOP (INSTRUMENTS) ×2 IMPLANT
PIN SECTOR W/GRIP ACE CUP 52MM (Hips) ×1 IMPLANT
POSITIONER SURGICAL ARM (MISCELLANEOUS) ×2 IMPLANT
SCREW 6.5MMX25MM (Screw) ×1 IMPLANT
SCREW PINN CAN BONE 6.5MMX15MM (Screw) ×1 IMPLANT
SPONGE GAUZE 4X4 12PLY (GAUZE/BANDAGES/DRESSINGS) ×2 IMPLANT
SPONGE LAP 18X18 X RAY DECT (DISPOSABLE) ×2 IMPLANT
STAPLER VISISTAT 35W (STAPLE) ×1 IMPLANT
STRIP CLOSURE SKIN 1/2X4 (GAUZE/BANDAGES/DRESSINGS) ×1 IMPLANT
SUCTION FRAZIER TIP 10 FR DISP (SUCTIONS) ×2 IMPLANT
SUT ETHIBOND NAB CT1 #1 30IN (SUTURE) ×4 IMPLANT
SUT VIC AB 1 CT1 27 (SUTURE) ×6
SUT VIC AB 1 CT1 27XBRD ANTBC (SUTURE) ×3 IMPLANT
SUT VIC AB 2-0 CT1 27 (SUTURE) ×6
SUT VIC AB 2-0 CT1 TAPERPNT 27 (SUTURE) ×3 IMPLANT
SUT VLOC 180 0 24IN GS25 (SUTURE) ×4 IMPLANT
SWAB COLLECTION DEVICE MRSA (MISCELLANEOUS) ×1 IMPLANT
SYR 50ML LL SCALE MARK (SYRINGE) IMPLANT
TOWEL OR 17X26 10 PK STRL BLUE (TOWEL DISPOSABLE) ×4 IMPLANT
TRAY FOLEY CATH 14FRSI W/METER (CATHETERS) ×2 IMPLANT
TUBE ANAEROBIC SPECIMEN COL (MISCELLANEOUS) IMPLANT
WATER STERILE IRR 1500ML POUR (IV SOLUTION) ×2 IMPLANT

## 2012-01-05 NOTE — Interval H&P Note (Signed)
History and Physical Interval Note:  01/05/2012 7:19 AM  Nancy Nancy  has presented today for surgery, with the diagnosis of Failed Left Total Hip Arthroplasty  The various methods of treatment have been discussed with the patient and family. After consideration of risks, benefits and other options for treatment, the patient has consented to  Procedure(s) (LRB) with comments: TOTAL HIP REVISION (Left) as a surgical intervention .  The patient's history has been reviewed, patient examined, no change in status, stable for surgery.  I have reviewed the patient's chart and labs.  Questions were answered to the patient's satisfaction.     Loanne Drilling

## 2012-01-05 NOTE — Transfer of Care (Signed)
Immediate Anesthesia Transfer of Care Note  Patient: Nancy Baxter  Procedure(s) Performed: Procedure(s) (LRB) with comments: TOTAL HIP REVISION (Left)  Patient Location: PACU  Anesthesia Type: General  Level of Consciousness: awake, alert , oriented and patient cooperative  Airway & Oxygen Therapy: Patient Spontanous Breathing and Patient connected to face mask oxygen  Post-op Assessment: Report given to PACU RN, Post -op Vital signs reviewed and stable and Patient moving all extremities  Post vital signs: Reviewed and stable  Complications: No apparent anesthesia complications

## 2012-01-05 NOTE — Anesthesia Preprocedure Evaluation (Addendum)
Anesthesia Evaluation  Patient identified by MRN, date of birth, ID band Patient awake    Reviewed: Allergy & Precautions, H&P , NPO status , Patient's Chart, lab work & pertinent test results  History of Anesthesia Complications (+) PONV  Airway Mallampati: II TM Distance: >3 FB Neck ROM: Full    Dental No notable dental hx.    Pulmonary neg pulmonary ROS,  breath sounds clear to auscultation  Pulmonary exam normal       Cardiovascular hypertension, Pt. on medications negative cardio ROS  Rhythm:Regular Rate:Normal     Neuro/Psych negative neurological ROS  negative psych ROS   GI/Hepatic negative GI ROS, Neg liver ROS,   Endo/Other  negative endocrine ROS  Renal/GU negative Renal ROS  negative genitourinary   Musculoskeletal negative musculoskeletal ROS (+)   Abdominal   Peds negative pediatric ROS (+)  Hematology negative hematology ROS (+)   Anesthesia Other Findings   Reproductive/Obstetrics negative OB ROS                          Anesthesia Physical Anesthesia Plan  ASA: II  Anesthesia Plan: General   Post-op Pain Management:    Induction: Intravenous  Airway Management Planned: Oral ETT  Additional Equipment:   Intra-op Plan:   Post-operative Plan: Extubation in OR  Informed Consent: I have reviewed the patients History and Physical, chart, labs and discussed the procedure including the risks, benefits and alternatives for the proposed anesthesia with the patient or authorized representative who has indicated his/her understanding and acceptance.   Dental advisory given  Plan Discussed with: CRNA  Anesthesia Plan Comments:         Anesthesia Quick Evaluation

## 2012-01-05 NOTE — Progress Notes (Signed)
Utilization review completed.  

## 2012-01-05 NOTE — Anesthesia Postprocedure Evaluation (Signed)
  Anesthesia Post-op Note  Patient: Nancy Baxter  Procedure(s) Performed: Procedure(s) (LRB): TOTAL HIP REVISION (Left)  Patient Location: PACU  Anesthesia Type: General  Level of Consciousness: awake and alert   Airway and Oxygen Therapy: Patient Spontanous Breathing  Post-op Pain: mild  Post-op Assessment: Post-op Vital signs reviewed, Patient's Cardiovascular Status Stable, Respiratory Function Stable, Patent Airway and No signs of Nausea or vomiting  Post-op Vital Signs: stable  Complications: No apparent anesthesia complications

## 2012-01-05 NOTE — Op Note (Signed)
NAME:  Nancy Baxter, Nancy Baxter               ACCOUNT NO.:  [id number]  MEDICAL RECORD NO.:  [id number]  LOCATION:  [facility]                         FACILITY:  [facility]  PHYSICIAN:  Ollen Gross, M.D.    DATE OF BIRTH:  Aug 29, 1940  DATE OF PROCEDURE:  01/05/2012 DATE OF DISCHARGE:                              OPERATIVE REPORT   PREOPERATIVE DIAGNOSIS:  Failed left total hip arthroplasty.  POSTOPERATIVE DIAGNOSIS:  Failed left total hip arthroplasty.  PROCEDURE:  Left acetabular revision.  SURGEON:  Ollen Gross, MD  ASSISTANT:  Dimitri Ped, PA-C  ANESTHESIA:  General.  ESTIMATED BLOOD LOSS:  700.  DRAINS:  Hemovac x1.  COMPLICATIONS:  None.  CONDITION:  Stable to recovery.  BRIEF CLINICAL NOTE:  Ms. Setzer is a 71 year old female, who had a left total hip arthroplasty performed several years ago, did well for several years and then a few years ago had a fall and started to have progressive pain after that.  Plain radiographs did not show any gross abnormalities but she had a metal on metal prosthesis and started having increasing pain.  It was felt as though she may have loosened her acetabulum or was getting impingement causing metallic debris.  She presents now for acetabular versus total hip revision.  PROCEDURE IN DETAIL:  After successful administration of general anesthetic, the patient was placed in the right lateral decubitus position with the left side up and held with the hip positioner.  Left lower extremity was isolated from her perineum with plastic drapes and prepped and draped in the usual sterile fashion.  Previous posterolateral incisions were utilized, skin cut with a 10 blade through the subcutaneous tissue to the level of the fascia lata which was incised in line with the skin incision.  Sciatic nerve was palpated and protected and the posterior pseudocapsule was incised off the femur. There was evidence of significant metallic debris within the joint. There  was also metallic debris overlying the greater trochanter, but there was no evidence of any muscle damage.  I carefully debrided the metal stained tissue from the underlying normal appearing tissue.  The sciatic nerve was palpated and protected throughout the time of doing this.  We then dislocated the hip and removed the femoral head.  There was a 28 mm femoral head.  The acetabular shell appeared to have gone into excess abduction and anteversion.  There was motion between the shell and the underlying bone.  I inspected the trunnion of the femoral neck and there is some scratching on the trunnion but no gross abnormality.  The femur was then retracted anteriorly to gain acetabular exposure.  We were able to extract the metallic liner from the acetabular shell.  I then removed the screw from the acetabular shell.  Utilizing a curved osteotome, I was easily able to disrupt the interface between the bone acetabular shell and shell was removed easily without any bony loss. There was a little metallic staining of the bone posterior and superior and I removed that back to normal-appearing bone.  It was a 50 mm shell that was extracted.  I began reaming a 47 mm to get more medial and once I got to the  medial wall expanded out to 51 mm.  A 52 mm pinnacle acetabular shell was then placed in anatomic position with excellent purchase.  I placed 2 additional dome screws for additional purchase. Apex hole eliminator was placed and a 32 mm neutral +4 marathon liner was placed.  I debated whether to remove the stem or not and I was hoping to be able to put a ceramic head, so I attempted to extract the S-ROM stem from the sleeve.  The chisel osteotome was used to disrupt the [facility] taper.  I was able to do that but unable to extract the stem any further than a cm from the sleeve.  I spent approximately an hour trying to extract the stem and felt I could not do it safely.  I then had the option of  either doing a extended osteotomy to remove the stem or to keep the stem intact and using metal head as opposed to ceramic head.  I felt that the best decision would be to keep the stem intact, the sling impacted it back down to the sleeve and we trialed it and found that the taper was solid and there was no movement between the stem and sleeve.  We then trialed a 32+ 3 head, which had great stability but not enough soft tissue tension.  I went to a 32+ 9.  There was no shuck.  She had full extension and full external rotation, 70 degrees flexion, 40 degrees adduction, 90 degrees internal rotation, 90 degrees of flexion and 70 degrees of internal rotation.  By placing the left leg on top of the right, I felt as though leg lengths were equal.  Hips and dislocating trials removed.  I thoroughly inspected the tissue and there was no other metallic debris.  The wound was copiously irrigated with saline solution.  The permanent 32+ 9 cobalt chrome head was then placed onto the femoral neck and the hip was reduced with same stability parameters. Wound was further irrigated and short rotators reattached to femur with Ethibond suture.  Fascia lata was closed over Hemovac drain with a running #1 V-Loc, subcu closed in interrupted 2-0 Vicryl, and subcuticular running 4-0 Monocryl.  The incision was then cleaned and dried and Steri-Strips and a bulky sterile dressing applied.  Please note that 20 mL of Exparel mixed with 50 mL of saline were injected into the gluteal muscles, short rotators, and subcu tissue prior to closure. Bulky sterile dressing was then applied and she was awakened and transported to recovery in stable condition.     Ollen Gross, M.D.     FA/MEDQ  D:  01/05/2012  T:  01/05/2012  Job:  [id number]

## 2012-01-05 NOTE — Brief Op Note (Signed)
01/05/2012  10:14 AM  PATIENT:  Baxter Flattery  71 y.o. female  PRE-OPERATIVE DIAGNOSIS:  Failed Left Total Hip Arthroplasty  POST-OPERATIVE DIAGNOSIS:  Failed Left Total Hip Arthroplasty  PROCEDURE:  Left acetabular revision  SURGEON:  Surgeon(s) and Role:    * Loanne Drilling, MD - Primary  PHYSICIAN ASSISTANT:   ASSISTANTS: Dimitri Ped, PA-C   ANESTHESIA:   general  EBL:  Total I/O In: 2500 [I.V.:2000; IV Piggyback:500] Out: 900 [Urine:200; Blood:700]  BLOOD ADMINISTERED:none  DRAINS: (Medium) Hemovact drain(s) in the Left hip with  Suction Open   LOCAL MEDICATIONS USED:  OTHER Exparel 20ml  COUNTS:  YES  TOURNIQUET:  * No tourniquets in log *  DICTATION: .Other Dictation: Dictation Number [id number]  PLAN OF CARE: Admit to inpatient   PATIENT DISPOSITION:  PACU - hemodynamically stable.

## 2012-01-05 NOTE — H&P (View-Only) (Signed)
Nancy Nancy  DOB: 1941-01-12 Married / Language: Undefined / Race: White Female  Date of Admission:  01/05/2012  Chief Complaint: Left Hip pain  History of Present Illness The patient is a 71 year old female who comes in for a preoperative History and Physical. The patient is scheduled for a 01/05/2012 hip poly revision versus acetabular revision to be performed by Dr. Gus Rankin. Aluisio, MD at [facility] on 01/05/2012. The patient is being followed for their left hip pain. They are 7 year(s) out from total hip arthroplasty. Symptoms reported today include: pain, popping and grinding. The patient feels that they are doing poorly and report their pain level to be moderate. The patient presents today following MRI. She had the MARS MRI. She continues with the squeaking type sensation and with the pain in the lateral hip. She does get some groin pain also. All of these symptoms began after she had that fall and they have gotten progressively worse over time. I really think she may have damaged the bearing surface or damaged the interface at the taper between the femoral head and neck. Either way things are getting worse. It is recommended that she doing a bearing exchange. There is also a possible need to revise the acetabular component, as it has become more vertical over time. We discussed all of this in detail. Minimum would be bearing surface change, maximum would be revision. She understands the clinical scenarios and we will elect to proceed with surgical treatment. They have been treated conservatively in the past for the above stated problem and despite conservative measures, they continue to have progressive pain and severe functional limitations and dysfunction. They have failed non-operative management. It is felt that they would benefit from undergoing total joint replacement. Risks and benefits of the procedure have been discussed with the patient and they elect  to proceed with surgery. There are no active contraindications to surgery such as ongoing infection or rapidly progressive neurological disease.   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 Nausea, Vomiting. Sulfa Drugs. Whelps Steroids. 02/26/2009 "shuts down adrenal glands" Ambien *HYPNOTICS*. caused sleepwalking CELEBREX. 04/15/1997 whelps   Family History Heart Disease. father Drug / Alcohol Addiction. father Cancer. mother Depression. sister Congestive Heart Failure. father Hypertension. mother Osteoarthritis. mother Osteoporosis. mother Diabetes Mellitus. mother Heart disease in female family member before age 25   Social History Children. 2 Alcohol use. current drinker; drinks wine; only occasionally per week Copy of Drug/Alcohol Rehab (Previously). no Drug/Alcohol Rehab (Currently). no Current work status. retired Exercise. Exercises rarely; does running / walking Tobacco use. Never smoker. never smoker Tobacco / smoke exposure. no Living situation. live with spouse Illicit drug use. no Marital status. married Pain Contract. no Number of flights of stairs before winded. 2-3 Post-Surgical Plans. Plan is to go home. Advance Directives. Living Will, Healthcare POA   Medication History Toprol XL (50MG  Tablet ER, Oral) Active. Cozaar (50MG  Tablet, Oral) Active. Triamterene/HCTZ (37.5-25MG  Capsule, Oral) Active. Requip XL (2MG  Tablet ER 24HR, Oral) Active. PriLOSEC OTC (20MG  Tablet DR, Oral) Active. Aspirin EC (81MG  Tablet DR, Oral) Active. Aleve (220MG  Tablet, Oral) Active. Caltrate 600+D Plus (600-800MG -UNIT Tablet Chewable, Oral) Active.   Pregnancy / Birth History Pregnant. no   Past Surgical History Foot Surgery. left Hysterectomy. partial (non-cancerous) Total Hip Replacement.  bilateral Cataract Surgery. bilateral Spinal Fusion. lower back Spinal Surgery Tonsillectomy  Medical History Heart murmur Hypertension  Gastroesophageal Reflux Disease Cataract Menopause Measles Mumps Rubella Scarlet Fever. Childhood Illness   Review of Systems General:Not Present- Chills, Fever, Night Sweats, Fatigue, Weight Gain, Weight Loss and Memory Loss. Skin:Not Present- Hives, Itching, Rash, Eczema and Lesions. HEENT:Not Present- Tinnitus, Headache, Double Vision, Visual Loss, Hearing Loss and Dentures. Respiratory:Not Present- Shortness of breath with exertion, Shortness of breath at rest, Allergies, Coughing up blood and Chronic Cough. Cardiovascular:Not Present- Chest Pain, Racing/skipping heartbeats, Difficulty Breathing Lying Down, Murmur, Swelling and Palpitations. Gastrointestinal:Not Present- Bloody Stool, Heartburn, Abdominal Pain, Vomiting, Nausea, Constipation, Diarrhea, Difficulty Swallowing, Jaundice and Loss of appetitie. Female Genitourinary:Present- Urinating at Night. Not Present- Blood in Urine, Urinary frequency, Weak urinary stream, Discharge, Flank Pain, Incontinence, Painful Urination, Urgency and Urinary Retention. Musculoskeletal:Present- Joint Pain, Back Pain and Morning Stiffness. Not Present- Muscle Weakness, Muscle Pain, Joint Swelling and Spasms. Neurological:Not Present- Tremor, Dizziness, Blackout spells, Paralysis, Difficulty with balance and Weakness. Psychiatric:Not Present- Insomnia.   Vitals Weight: 149 lb Height: 60 in Weight was reported by patient. Height was reported by patient. Body Surface Area: 1.69 m Body Mass Index: 29.1 kg/m Pulse: 76 (Regular) Resp.: 12 (Unlabored) BP: 108/60 (Sitting, Right Arm, Standard)    Physical Exam The physical exam findings are as follows:  Note: Patient is a 71 year old female with continued hip pain.   General Mental Status - Alert, cooperative and good  historian. General Appearance- pleasant. Not in acute distress. Orientation- Oriented X3. Build & Nutrition- Well nourished and Well developed.   Head and Neck Head- normocephalic, atraumatic . Neck Global Assessment- supple. no bruit auscultated on the right and no bruit auscultated on the left.   Eye Pupil- Bilateral- Regular and Round. Motion- Bilateral- EOMI.   Chest and Lung Exam Auscultation: Breath sounds:- clear at anterior chest wall and - clear at posterior chest wall. Adventitious sounds:- No Adventitious sounds.   Cardiovascular Auscultation:Rhythm- Regular rate and rhythm. Heart Sounds- S1 WNL and S2 WNL. Murmurs & Other Heart Sounds: Murmur 1:Location- Aortic Area. Timing- Holosystolic. Grade- III/VI. Character- Holosystolic and Medium pitched.   Abdomen Palpation/Percussion:Tenderness- Abdomen is non-tender to palpation. Rigidity (guarding)- Abdomen is soft. Auscultation:Auscultation of the abdomen reveals - Bowel sounds normal.   Female Genitourinary Not done, not pertinent to present illness  Musculoskeletal On exam, she's alert and oriented, in no apparent distress. Left hip can be flexed to 120, rotated in 30, out 40, abducted 40 without discomfort on ROM. There is some lateral tenderness.  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 Lequita Halt.  Plan is to go home.  PCP - Dr. Creola Corn - Patient has been seen preoperatively and felt to be stable for surgery.  Signed electronically by Roberts Gaudy, PA-C

## 2012-01-06 ENCOUNTER — Encounter (HOSPITAL_COMMUNITY): Payer: Self-pay | Admitting: Orthopedic Surgery

## 2012-01-06 DIAGNOSIS — E871 Hypo-osmolality and hyponatremia: Secondary | ICD-10-CM

## 2012-01-06 DIAGNOSIS — D62 Acute posthemorrhagic anemia: Secondary | ICD-10-CM

## 2012-01-06 HISTORY — DX: Hypo-osmolality and hyponatremia: E87.1

## 2012-01-06 HISTORY — DX: Acute posthemorrhagic anemia: D62

## 2012-01-06 LAB — CBC
HCT: 26.3 % — ABNORMAL LOW (ref 36.0–46.0)
Hemoglobin: 9.2 g/dL — ABNORMAL LOW (ref 12.0–15.0)
MCH: 28.3 pg (ref 26.0–34.0)
MCHC: 35 g/dL (ref 30.0–36.0)
RDW: 14.1 % (ref 11.5–15.5)

## 2012-01-06 LAB — BASIC METABOLIC PANEL
BUN: 16 mg/dL (ref 6–23)
Calcium: 7.9 mg/dL — ABNORMAL LOW (ref 8.4–10.5)
GFR calc non Af Amer: 48 mL/min — ABNORMAL LOW (ref >90)
Glucose, Bld: 113 mg/dL — ABNORMAL HIGH (ref 70–99)

## 2012-01-06 MED ORDER — NON FORMULARY: 20.0000 mg | Freq: Every day | Status: DC

## 2012-01-06 MED ORDER — OMEPRAZOLE 20 MG PO CPDR
20.0000 mg | DELAYED_RELEASE_CAPSULE | Freq: Every day | ORAL | Status: DC
  Administered 2012-01-06 – 2012-01-07 (×2): 20 mg via ORAL
  Filled 2012-01-06 (×3): qty 1

## 2012-01-06 MED ORDER — POLYSACCHARIDE IRON COMPLEX 150 MG PO CAPS
150.0000 mg | ORAL_CAPSULE | Freq: Every day | ORAL | Status: DC
  Administered 2012-01-06 – 2012-01-07 (×2): 150 mg via ORAL
  Filled 2012-01-06 (×2): qty 1

## 2012-01-06 NOTE — Evaluation (Signed)
Physical Therapy Evaluation Patient Details Name: Nancy Baxter MRN: [id number] DOB: 11-Nov-1940 Today's Date: 01/06/2012 Time: 4098-1191 PT Time Calculation (min): 34 min  PT Assessment / Plan / Recommendation Clinical Impression  Pt s/p L THR revision presents with decreased L LE strength/ROM and posterior THP limiting functional mobiltiy    PT Assessment  Patient needs continued PT services    Follow Up Recommendations  Home health PT    Barriers to Discharge        Equipment Recommendations  None recommended by PT    Recommendations for Other Services OT consult   Frequency 7X/week    Precautions / Restrictions Precautions Precautions: Posterior Hip Precaution Comments: sign hung in room Restrictions Weight Bearing Restrictions: Yes LLE Weight Bearing: Partial weight bearing LLE Partial Weight Bearing Percentage or Pounds: 25-50%   Pertinent Vitals/Pain 2/10; premedicated, ice pack provided      Mobility  Bed Mobility Bed Mobility: Supine to Sit Supine to Sit: 4: Min assist;3: Mod assist Details for Bed Mobility Assistance: cues for sequence, use of R LE and THP Transfers Transfers: Sit to Stand;Stand to Sit Sit to Stand: 4: Min assist;With upper extremity assist;From elevated surface Stand to Sit: 4: Min assist Details for Transfer Assistance: cues for use of UEs and for LE management Ambulation/Gait Ambulation/Gait Assistance: 4: Min assist;3: Mod assist Ambulation Distance (Feet): 38 Feet Assistive device: Rolling walker Ambulation/Gait Assistance Details: cues for posture, sequence, position from RW and PWB Gait Pattern: Step-to pattern    Shoulder Instructions     Exercises Total Joint Exercises Ankle Circles/Pumps: AROM;Both;20 reps;Supine Quad Sets: AROM;10 reps;Supine;Both Heel Slides: AAROM;10 reps;Supine;Left Hip ABduction/ADduction: AAROM;10 reps;Supine;Left   PT Diagnosis: Difficulty walking  PT Problem List: Decreased strength;Decreased  range of motion;Decreased activity tolerance;Decreased mobility;Decreased knowledge of use of DME;Pain;Decreased knowledge of precautions PT Treatment Interventions: DME instruction;Gait training;Stair training;Functional mobility training;Therapeutic activities;Therapeutic exercise;Patient/family education   PT Goals Acute Rehab PT Goals PT Goal Formulation: With patient Time For Goal Achievement: 01/10/12 Potential to Achieve Goals: Good Pt will go Supine/Side to Sit: with supervision PT Goal: Supine/Side to Sit - Progress: Goal set today Pt will go Sit to Supine/Side: with supervision PT Goal: Sit to Supine/Side - Progress: Goal set today Pt will go Sit to Stand: with supervision PT Goal: Sit to Stand - Progress: Goal set today Pt will go Stand to Sit: with supervision PT Goal: Stand to Sit - Progress: Goal set today Pt will Ambulate: 51 - 150 feet;with supervision;with rolling walker PT Goal: Ambulate - Progress: Goal set today Pt will Go Up / Down Stairs: 3-5 stairs;with min assist;with least restrictive assistive device PT Goal: Up/Down Stairs - Progress: Goal set today  Visit Information  Last PT Received On: 01/06/12 Assistance Needed: +1    Subjective Data  Subjective: It hurts less than before surgery Patient Stated Goal: Resume previous lifestyle with decreased pain   Prior Functioning  Home Living Lives With: Spouse Available Help at Discharge: Family Type of Home: House Home Access: Stairs to enter Secretary/administrator of Steps: 3 Entrance Stairs-Rails: Left Home Layout: One level Home Adaptive Equipment: Walker - rolling Prior Function Level of Independence: Independent Able to Take Stairs?: Yes Driving: Yes Communication Communication: No difficulties Dominant Hand: Right    Cognition  Overall Cognitive Status: Appears within functional limits for tasks assessed/performed Arousal/Alertness: Awake/alert Orientation Level: Appears intact for tasks  assessed Behavior During Session: [facility] for tasks performed    Extremity/Trunk Assessment Right Upper Extremity Assessment RUE ROM/Strength/Tone: [facility]  for tasks assessed Left Upper Extremity Assessment LUE ROM/Strength/Tone: [facility] for tasks assessed Right Lower Extremity Assessment RLE ROM/Strength/Tone: [facility] [place] for tasks assessed Left Lower Extremity Assessment LLE ROM/Strength/Tone: Deficits LLE ROM/Strength/Tone Deficits: hip strength 2+/5; AAROM at hip 15 abd, 80 flex   Balance    End of Session PT - End of Session Activity Tolerance: Patient tolerated treatment well Patient left: in chair;with call bell/phone within reach;with family/visitor present Nurse Communication: Mobility status  GP     Jahmeir Geisen 01/06/2012, 2:25 PM

## 2012-01-06 NOTE — Progress Notes (Signed)
Physical Therapy Treatment Patient Details Name: Nancy Baxter MRN: [id number] DOB: [birth year] Today's Date: 01/06/2012 Time: [id number] PT Time Calculation (min): 36 min  PT Assessment / Plan / Recommendation Comments on Treatment Session  Pt struggling with bed mobility    Follow Up Recommendations  Home health PT    Barriers to Discharge        Equipment Recommendations  None recommended by PT    Recommendations for Other Services OT consult  Frequency 7X/week   Plan Discharge plan remains appropriate    Precautions / Restrictions Precautions Precautions: Posterior Hip Precaution Comments: pt recalled all THP without cues Restrictions Weight Bearing Restrictions: Yes LLE Weight Bearing: Partial weight bearing LLE Partial Weight Bearing Percentage or Pounds: 25-50%   Pertinent Vitals/Pain     Mobility  Bed Mobility Bed Mobility: Supine to Sit;Sit to Supine Supine to Sit: 4: Min assist;3: Mod assist Sit to Supine: 3: Mod assist Details for Bed Mobility Assistance: cues for sequence, use of R LE and THP Transfers Transfers: Sit to Stand;Stand to Sit Sit to Stand: 4: Min assist Stand to Sit: 4: Min assist Details for Transfer Assistance: cues for use of UEs and for LE management Ambulation/Gait Ambulation/Gait Assistance: 4: Min assist Ambulation Distance (Feet): 72 Feet Assistive device: Rolling walker Ambulation/Gait Assistance Details: cues for ER on L, posture, stride length, and position from RW Gait Pattern: Step-to pattern    Exercises     PT Diagnosis:    PT Problem List:   PT Treatment Interventions:     PT Goals Acute Rehab PT Goals PT Goal Formulation: With patient Time For Goal Achievement: 01/10/12 Potential to Achieve Goals: Good Pt will go Supine/Side to Sit: with supervision PT Goal: Supine/Side to Sit - Progress: Progressing toward goal Pt will go Sit to Supine/Side: with supervision PT Goal: Sit to Supine/Side - Progress: Progressing  toward goal Pt will go Sit to Stand: with supervision PT Goal: Sit to Stand - Progress: Progressing toward goal Pt will go Stand to Sit: with supervision PT Goal: Stand to Sit - Progress: Progressing toward goal Pt will Ambulate: 51 - 150 feet;with supervision;with rolling walker PT Goal: Ambulate - Progress: Progressing toward goal Pt will Go Up / Down Stairs: 3-5 stairs;with min assist;with least restrictive assistive device  Visit Information  Last PT Received On: 01/06/12 Assistance Needed: +1    Subjective Data  Patient Stated Goal: Resume previous lifestyle with decreased pain   Cognition  Overall Cognitive Status: Appears within functional limits for tasks assessed/performed Arousal/Alertness: Awake/alert Orientation Level: Appears intact for tasks assessed Behavior During Session: [facility] for tasks performed    Balance     End of Session PT - End of Session Activity Tolerance: Patient tolerated treatment well Patient left: with call bell/phone within reach;with family/visitor present;in bed Nurse Communication: Mobility status   GP     Davell Beckstead 01/06/2012, 4:09 PM

## 2012-01-06 NOTE — Progress Notes (Signed)
   Subjective: 1 Day Post-Op Procedure(s) (LRB): TOTAL HIP REVISION (Left) Patient reports pain as mild.   Patient seen in rounds by Dr. Lequita Halt. Patient is well, and has had no acute complaints or problems We will start therapy today.  Plan is to go Home after hospital stay.  Objective: Vital signs in last 24 hours: Temp:  [97 F (36.1 C)-99.6 F (37.6 C)] 98.4 F (36.9 C) (09/26 0625) Pulse Rate:  [55-90] 76  (09/26 0625) Resp:  [10-18] 16  (09/26 0625) BP: (82-134)/(40-90) 101/64 mmHg (09/26 0625) SpO2:  [90 %-100 %] 94 % (09/26 0625) Weight:  [66.679 kg (147 lb)] 66.679 kg (147 lb) (09/25 1154)  Intake/Output from previous day:  Intake/Output Summary (Last 24 hours) at 01/06/12 0930 Last data filed at 01/06/12 7829  Gross per 24 hour  Intake   3155 ml  Output   2765 ml  Net    390 ml    Intake/Output this shift: UOP 800 since MN  Labs:  [facility] 01/06/12 0410 01/05/12 0550  HGB 9.2* 14.9    Basename 01/06/12 0410 01/05/12 0550  WBC 10.1 8.2  RBC 3.25* 5.27*  HCT 26.3* 42.3  PLT 148* 199    Basename 01/06/12 0410 01/05/12 0550  NA 126* 133*  K 4.3 3.8  CL 96 97  CO2 24 25  BUN 16 26*  CREATININE 1.13* 1.17*  GLUCOSE 113* 95  CALCIUM 7.9* 9.8    Basename 01/05/12 0550  LABPT --  INR 0.96    EXAM General - Patient is Alert, Appropriate and Oriented Extremity - Neurovascular intact Sensation intact distally Dorsiflexion/Plantar flexion intact Dressing - dressing C/D/I Motor Function - intact, moving foot and toes well on exam.  Hemovac pulled without difficulty.  Past Medical History  Diagnosis Date  . Complication of anesthesia   . PONV (postoperative nausea and vomiting)   . Hypertension   . GERD (gastroesophageal reflux disease)   . Arthritis   . Anemia     Assessment/Plan: 1 Day Post-Op Procedure(s) (LRB): TOTAL HIP REVISION (Left) Principal Problem:  *Failed total hip arthroplasty Active Problems:  Postop Acute blood loss  anemia  Postop Hyponatremia   Advance diet Up with therapy Plan for discharge tomorrow Discharge home with home health  DVT Prophylaxis - Xarelto Partial Weight Bearing 25-50% Left Leg D/C Knee Immobilizer Hemovac Pulled Begin Therapy Hip Preacutions No vaccines.  Loran Fleet 01/06/2012, 9:30 AM

## 2012-01-06 NOTE — Progress Notes (Signed)
  CARE MANAGEMENT NOTE 01/06/2012  Patient:  RILIE, GLANZ   Account Number:  [id number]  Date Initiated:  01/06/2012  Documentation initiated by:  Colleen Can  Subjective/Objective Assessment:   DX FAILED LEFT TOTAL HIP; REVISION lEFT HIP     Action/Plan:   CM SPOKE WITH PATIENT. pLANS ARE FOR PATIENT TO RETURN TO HER HOME IN [place] WHERE SPOUSE WILL BE CAREGIVER. ALREWADY HAS RW, CANE CRUTCHES AND COMMODE SEAT. WILL HAVE HH SERVICES UPON DISCHARGE. [facility]   Anticipated DC Date:  01/06/2012   Anticipated DC Plan:  HOME W HOME HEALTH SERVICES  In-house referral  NA      DC Planning Services  CM consult      [facility] Choice  HOME HEALTH   Choice offered to / List presented to:  C-1 Patient   DME arranged  NA      DME agency  NA        Status of service:  In process, will continue to follow

## 2012-01-07 DIAGNOSIS — Z9289 Personal history of other medical treatment: Secondary | ICD-10-CM

## 2012-01-07 HISTORY — DX: Personal history of other medical treatment: Z92.89

## 2012-01-07 LAB — CBC
HCT: 28.1 % — ABNORMAL LOW (ref 36.0–46.0)
Hemoglobin: 9.8 g/dL — ABNORMAL LOW (ref 12.0–15.0)
MCV: 80.3 fL (ref 78.0–100.0)
Platelets: 125 10*3/uL — ABNORMAL LOW (ref 150–400)
RBC: 2.89 MIL/uL — ABNORMAL LOW (ref 3.87–5.11)
WBC: 10.3 10*3/uL (ref 4.0–10.5)
WBC: 14.3 10*3/uL — ABNORMAL HIGH (ref 4.0–10.5)

## 2012-01-07 LAB — BASIC METABOLIC PANEL
CO2: 24 mEq/L (ref 19–32)
Chloride: 95 mEq/L — ABNORMAL LOW (ref 96–112)
Creatinine, Ser: 0.98 mg/dL (ref 0.50–1.10)
Sodium: 124 mEq/L — ABNORMAL LOW (ref 135–145)

## 2012-01-07 LAB — PREPARE RBC (CROSSMATCH)

## 2012-01-07 MED ORDER — TRAMADOL HCL 50 MG PO TABS
50.0000 mg | ORAL_TABLET | Freq: Four times a day (QID) | ORAL | Status: DC | PRN
  Administered 2012-01-07: 100 mg via ORAL
  Filled 2012-01-07: qty 2

## 2012-01-07 MED ORDER — HYDROMORPHONE HCL 2 MG PO TABS: 2.0000 mg | ORAL_TABLET | ORAL | Status: DC | PRN

## 2012-01-07 MED ORDER — RIVAROXABAN 10 MG PO TABS: 10.0000 mg | ORAL_TABLET | Freq: Every day | ORAL | Status: DC

## 2012-01-07 MED ORDER — METHOCARBAMOL 500 MG PO TABS: 500.0000 mg | ORAL_TABLET | Freq: Four times a day (QID) | ORAL | Status: DC | PRN

## 2012-01-07 MED ORDER — TRAMADOL HCL 50 MG PO TABS: 50.0000 mg | ORAL_TABLET | Freq: Four times a day (QID) | ORAL | Status: DC | PRN

## 2012-01-07 MED ORDER — ACETAMINOPHEN 325 MG PO TABS
650.0000 mg | ORAL_TABLET | Freq: Once | ORAL | Status: AC
  Administered 2012-01-07: 650 mg via ORAL

## 2012-01-07 NOTE — Progress Notes (Signed)
CARE MANAGEMENT NOTE 01/07/2012  Patient:  Nancy Baxter, Nancy Baxter   Account Number:  [id number]  Date Initiated:  01/06/2012  Documentation initiated by:  Colleen Can  Subjective/Objective Assessment:   DX FAILED LEFT TOTAL HIP; REVISION lEFT HIP     Action/Plan:   CM SPOKE WITH PATIENT. pLANS ARE FOR PATIENT TO RETURN TO HER HOME IN [place] WHERE SPOUSE WILL BE CAREGIVER. ALREADY HAS RW, CANE CRUTCHES AND COMMODE SEAT. WILL HAVE HH SERVICES UPON DISCHARGE. Hortencia Conradi [facility]   Anticipated DC Date:  01/06/2012   Anticipated DC Plan:  HOME W HOME HEALTH SERVICES  In-house referral  NA      DC Planning Services  CM consult      [facility] Choice  HOME HEALTH   Choice offered to / List presented to:  C-1 Patient   DME arranged  NA      DME agency  NA     HH arranged  HH-2 PT      HH agency  [facility]   Status of service:  In process, will continue to follow Comments:  01/07/2012 Raynelle Bring BSN CCM [phone] PT HAS REQUESTED [facility] WHO IS ABLE TO PROVIDE HH SERVICES WITH START DATE OF DAY AFTER DISCHARGE. ANTICIPATE DISCHARGE TODAY.

## 2012-01-07 NOTE — Progress Notes (Signed)
   Subjective: 2 Days Post-Op Procedure(s) (LRB): TOTAL HIP REVISION (Left) Patient reports pain as mild.   Patient seen in rounds for Dr. Lequita Halt. Patient is well, and has had no acute complaints or problems Plan is to go Home after hospital stay.  HGB is down though to 8.1. Asymptomatic at this time but has not been up yet.  Wants to go home so we will give one unit and then home later today after two sessions of therapy.  Objective: Vital signs in last 24 hours: Temp:  [98.8 F (37.1 C)-101.8 F (38.8 C)] 99 F (37.2 C) (09/27 0504) Pulse Rate:  [80-114] 96  (09/27 0504) Resp:  [16-20] 16  (09/27 0504) BP: (95-119)/(55-72) 119/72 mmHg (09/27 0504) SpO2:  [93 %-100 %] 97 % (09/27 0504)  Intake/Output from previous day:  Intake/Output Summary (Last 24 hours) at 01/07/12 0820 Last data filed at 01/07/12 0600  Gross per 24 hour  Intake    600 ml  Output   1525 ml  Net   -925 ml     Labs:  Basename 01/07/12 0415 01/06/12 0410 01/05/12 0550  HGB 8.1* 9.2* 14.9    Basename 01/07/12 0415 01/06/12 0410  WBC 10.3 10.1  RBC 2.89* 3.25*  HCT 23.2* 26.3*  PLT 125* 148*    Basename 01/07/12 0415 01/06/12 0410  NA 124* 126*  K 3.6 4.3  CL 95* 96  CO2 24 24  BUN 9 16  CREATININE 0.98 1.13*  GLUCOSE 119* 113*  CALCIUM 7.8* 7.9*    Basename 01/05/12 0550  LABPT --  INR 0.96    EXAM General - Patient is Alert, Appropriate and Oriented Extremity - Neurovascular intact Sensation intact distally Dorsiflexion/Plantar flexion intact Dressing/Incision - clean, dry, no drainage, healing Motor Function - intact, moving foot and toes well on exam.   Past Medical History  Diagnosis Date  . Complication of anesthesia   . PONV (postoperative nausea and vomiting)   . Hypertension   . GERD (gastroesophageal reflux disease)   . Arthritis   . Anemia     Assessment/Plan: 2 Days Post-Op Procedure(s) (LRB): TOTAL HIP REVISION (Left) Principal Problem:  *Failed total hip  arthroplasty Active Problems:  Postop Acute blood loss anemia  Postop Hyponatremia  Postop Transfusion   Advance diet Up with therapy Discharge home with home health  Partial Weight Bearing 25-50% Left Leg Diet - Cardiac diet Follow up - in 2 weeks Activity - PWB Disposition - Home Condition Upon Discharge - Good D/C Meds - See DC Summary DVT Prophylaxis - Xarelto  Nancy Baxter, Nancy Baxter 01/07/2012, 8:20 AM

## 2012-01-07 NOTE — Progress Notes (Signed)
Physical Therapy Treatment Patient Details Name: Nancy Baxter MRN: [id number] DOB: 25-Feb-1941 Today's Date: 01/07/2012 Time: 9528-4132 PT Time Calculation (min): 48 min  PT Assessment / Plan / Recommendation Comments on Treatment Session  Marked improvement in bed mobility    Follow Up Recommendations  Home health PT    Barriers to Discharge        Equipment Recommendations  None recommended by PT    Recommendations for Other Services OT consult  Frequency 7X/week   Plan Discharge plan remains appropriate    Precautions / Restrictions Precautions Precautions: Posterior Hip Precaution Comments: pt recalled all THP without cues Restrictions Weight Bearing Restrictions: Yes LLE Weight Bearing: Partial weight bearing LLE Partial Weight Bearing Percentage or Pounds: 25-50%   Pertinent Vitals/Pain 2-3/10; premedicated    Mobility  Bed Mobility Bed Mobility: Supine to Sit;Sit to Supine Supine to Sit: 4: Min assist Transfers Transfers: Sit to Stand;Stand to Sit Sit to Stand: 4: Min guard;4: Min assist Stand to Sit: 4: Min guard Details for Transfer Assistance: cues for use of UEs and for LE management Ambulation/Gait Ambulation/Gait Assistance: 4: Min guard Ambulation Distance (Feet): 100 Feet (and 25) Assistive device: Rolling walker Ambulation/Gait Assistance Details: cues for posture and ER on L and position from RW Gait Pattern: Step-to pattern    Exercises Total Joint Exercises Ankle Circles/Pumps: AROM;Both;20 reps;Supine Quad Sets: AROM;10 reps;Supine;Both Gluteal Sets: AROM;Both;10 reps;Supine Heel Slides: AAROM;Supine;Left;20 reps Hip ABduction/ADduction: AAROM;Supine;Left;20 reps   PT Diagnosis:    PT Problem List:   PT Treatment Interventions:     PT Goals Acute Rehab PT Goals PT Goal Formulation: With patient Time For Goal Achievement: 01/10/12 Potential to Achieve Goals: Good Pt will go Supine/Side to Sit: with supervision PT Goal: Supine/Side  to Sit - Progress: Progressing toward goal Pt will go Sit to Supine/Side: with supervision PT Goal: Sit to Supine/Side - Progress: Progressing toward goal Pt will go Sit to Stand: with supervision PT Goal: Sit to Stand - Progress: Progressing toward goal Pt will go Stand to Sit: with supervision PT Goal: Stand to Sit - Progress: Progressing toward goal Pt will Ambulate: 51 - 150 feet;with supervision;with rolling walker PT Goal: Ambulate - Progress: Progressing toward goal  Visit Information  Last PT Received On: 01/07/12 Assistance Needed: +1    Subjective Data  Subjective: I going to get 1 unit of blood and go home today Patient Stated Goal: Resume previous lifestyle with decreased pain   Cognition  Overall Cognitive Status: Appears within functional limits for tasks assessed/performed Arousal/Alertness: Awake/alert Orientation Level: Appears intact for tasks assessed Behavior During Session: [facility] for tasks performed    Balance     End of Session PT - End of Session Activity Tolerance: Patient tolerated treatment well Patient left: with call bell/phone within reach;with family/visitor present;in chair Nurse Communication: Mobility status   GP     Nancy Baxter 01/07/2012, 10:39 AM

## 2012-01-07 NOTE — Care Management Note (Signed)
    Page 1 of 2   01/07/2012     5:59:58 PM   CARE MANAGEMENT NOTE 01/07/2012  Patient:  Nancy Baxter, Nancy Baxter   Account Number:  [id number]  Date Initiated:  01/06/2012  Documentation initiated by:  Colleen Can  Subjective/Objective Assessment:   DX FAILED LEFT TOTAL HIP; REVISION lEFT HIP     Action/Plan:   CM SPOKE WITH PATIENT. pLANS ARE FOR PATIENT TO RETURN TO HER HOME IN [place] WHERE SPOUSE WILL BE CAREGIVER. ALREADY HAS RW, CANE CRUTCHES AND COMMODE SEAT. WILL HAVE HH SERVICES UPON DISCHARGE. Hortencia Conradi [facility]   Anticipated DC Date:  01/06/2012   Anticipated DC Plan:  HOME W HOME HEALTH SERVICES  In-house referral  NA      DC Planning Services  CM consult      [place] Choice  HOME HEALTH   Choice offered to / List presented to:  C-1 Patient   DME arranged  NA      DME agency  NA     HH arranged  HH-2 PT      HH agency  [facility]   Status of service:  Completed, signed off Medicare Important Message given?  NA - LOS <3 / Initial given by admissions (If response is "NO", the following Medicare IM given date fields will be blank) Date Medicare IM given:   Date Additional Medicare IM given:    Discharge Disposition:  HOME W HOME HEALTH SERVICES  Per UR Regulation:    If discussed at Long Length of Stay Meetings, dates discussed:    Comments:  01/07/2012 Raynelle Bring BSN CCM [phone] PT HAS REQUESTED [facility] WHO IS ABLE TO PROVIDE HH SERVICES WITH START DATE OF DAY AFTER DISCHARGE. ANTICIPATE DISCHARGE TODAY.

## 2012-01-07 NOTE — Evaluation (Signed)
Occupational Therapy Evaluation Patient Details Name: Nancy Baxter MRN: [id number] DOB: [birth year] Today's Date: 01/07/2012 Time: 4098-1191 OT Time Calculation (min): 25 min  OT Assessment / Plan / Recommendation Clinical Impression  Pt is s/p L hip revision and doing well. Plan is for discharge later today. All education completed and no follow up OT needed.     OT Assessment  Patient does not need any further OT services    Follow Up Recommendations  No OT follow up;Supervision/Assistance - 24 hour    Barriers to Discharge      Equipment Recommendations  None recommended by PT;None recommended by OT    Recommendations for Other Services    Frequency       Precautions / Restrictions Precautions Precautions: Posterior Hip Precaution Comments: reviewed all hip precautions with pt/spouse. pt could state 2/3 on her own Restrictions Weight Bearing Restrictions: Yes LLE Weight Bearing: Partial weight bearing LLE Partial Weight Bearing Percentage or Pounds: 25-50        ADL  Eating/Feeding: Simulated;Independent Where Assessed - Eating/Feeding: Chair Grooming: Performed;Wash/dry hands;Min guard Where Assessed - Grooming: Unsupported standing Upper Body Bathing: Simulated;Chest;Right arm;Left arm;Abdomen;Set up Where Assessed - Upper Body Bathing: Unsupported sitting Lower Body Bathing: Simulated;Moderate assistance;Other (comment) (without AE) Where Assessed - Lower Body Bathing: Supported sit to stand Upper Body Dressing: Simulated;Set up Where Assessed - Upper Body Dressing: Unsupported sitting Lower Body Dressing: Simulated;Moderate assistance;Other (comment) (without AE) Where Assessed - Lower Body Dressing: Supported sit to Pharmacist, hospital: Performed;Min guard;Other (comment) (min verbal cues for hand placement) Toilet Transfer Equipment: Raised toilet seat with arms (or 3-in-1 over toilet) Toileting - Clothing Manipulation and Hygiene: Simulated;Min  guard Where Assessed - Glass blower/designer Manipulation and Hygiene: Standing Tub/Shower Transfer Method: Not assessed Equipment Used: Rolling walker ADL Comments: Reviewed THPs with pt/spouse. Pt states her husband will assist with LB ADL though she does own AE from previous surgeries. Verbally reviewed shower transfer technique and demonstrated for pt/spouse. Advised to consider shower chair as pt is PWB for bathing. Pt states she stood last time to shower . They verbalize understanding of where to obtain DME. Pt doing well and husband can assist PRN.     OT Diagnosis:    OT Problem List:   OT Treatment Interventions:     OT Goals    Visit Information  Last OT Received On: 01/07/12 Assistance Needed: +1    Subjective Data  Subjective: I need to go to the bathroom (pt up from chair with husband when OT arrived) Patient Stated Goal: home   Prior Functioning     Home Living Lives With: Spouse Available Help at Discharge: Family Type of Home: House Home Access: Stairs to enter Secretary/administrator of Steps: 3 Entrance Stairs-Rails: Left Home Layout: One level Bathroom Shower/Tub: Health visitor: Standard Home Adaptive Equipment: Environmental consultant - rolling;Raised toilet seat with rails Prior Function Level of Independence: Independent Able to Take Stairs?: Yes Driving: Yes Communication Communication: No difficulties Dominant Hand: Right         Vision/Perception     Cognition  Overall Cognitive Status: Appears within functional limits for tasks assessed/performed Arousal/Alertness: Awake/alert Orientation Level: Appears intact for tasks assessed Behavior During Session: [facility] for tasks performed    Extremity/Trunk Assessment Right Upper Extremity Assessment RUE ROM/Strength/Tone: [facility] for tasks assessed Left Upper Extremity Assessment LUE ROM/Strength/Tone: WFL for tasks assessed     Mobility Transfers Transfers: Sit to Stand;Stand to Sit Sit to  Stand: 4: Min guard;With upper  extremity assist;From chair/3-in-1 Stand to Sit: 4: Min guard;With upper extremity assist;To chair/3-in-1 Details for Transfer Assistance: min verbal cues for THPs     Shoulder Instructions     Exercise     Balance     End of Session OT - End of Session Activity Tolerance: Patient tolerated treatment well Patient left: in chair;with call bell/phone within reach;with family/visitor present  GO     Lennox Laity [phone] 01/07/2012, 12:34 PM

## 2012-01-07 NOTE — Discharge Summary (Signed)
Physician Discharge Summary   Patient ID: Nancy Baxter MRN: [id number] DOB/AGE: 1940-07-24 71 y.o.  Admit date: 01/05/2012 Discharge date: 01/07/2012  Primary Diagnosis: Failed left total hip arthroplasty.   Admission Diagnoses:  Past Medical History  Diagnosis Date  . Complication of anesthesia   . PONV (postoperative nausea and vomiting)   . Hypertension   . GERD (gastroesophageal reflux disease)   . Arthritis   . Anemia    Discharge Diagnoses:   Principal Problem:  *Failed total hip arthroplasty Active Problems:  Postop Acute blood loss anemia  Postop Hyponatremia  Postop Transfusion  Procedure: Procedure(s) (LRB): TOTAL HIP REVISION (Left)   Consults: None  HPI: [name] is a 71 year old female, who had a left  total hip arthroplasty performed several years ago, did well for several  years and then a few years ago had a fall and started to have  progressive pain after that. Plain radiographs did not show any gross  abnormalities but she had a metal on metal prosthesis and started having  increasing pain. It was felt as though she may have loosened her  acetabulum or was getting impingement causing metallic debris. She  presents now for acetabular versus total hip revision.     Laboratory Data: Admission on 02/23/2011, Discharged on 02/23/2011  Component Date Value Range Status  . Sodium 02/22/2011 138  135 - 145 mEq/L Final  . Potassium 02/22/2011 3.9  3.5 - 5.1 mEq/L Final  . Chloride 02/22/2011 101  96 - 112 mEq/L Final  . CO2 02/22/2011 25  19 - 32 mEq/L Final  . Glucose, Bld 02/22/2011 98  70 - 99 mg/dL Final  . BUN 47/42/5956 31* 6 - 23 mg/dL Final  . Creatinine, Ser 02/22/2011 1.34* 0.50 - 1.10 mg/dL Final  . Calcium 38/75/6433 10.0  8.4 - 10.5 mg/dL Final  . GFR calc non Af Amer 02/22/2011 39* >90 mL/min Final  . GFR calc Af Amer 02/22/2011 45* >90 mL/min Final   Comment:                                 The eGFR has been calculated                 using the CKD EPI equation.                          This calculation has not been                          validated in all clinical                          situations.                          eGFR's persistently                          <90 mL/min signify                          possible Chronic Kidney Disease.  Marland Kitchen Hemoglobin 02/23/2011 16.0* 12.0 - 15.0 g/dL Final    Basename 29/51/88 0415 01/06/12 0410 01/05/12 0550  HGB 8.1* 9.2* 14.9    Basename 01/07/12 0415  01/06/12 0410  WBC 10.3 10.1  RBC 2.89* 3.25*  HCT 23.2* 26.3*  PLT 125* 148*    Basename 01/07/12 0415 01/06/12 0410  NA 124* 126*  K 3.6 4.3  CL 95* 96  CO2 24 24  BUN 9 16  CREATININE 0.98 1.13*  GLUCOSE 119* 113*  CALCIUM 7.8* 7.9*    Basename 01/05/12 0550  LABPT --  INR 0.96    X-Rays:Dg Chest 2 View  01/05/2012  *RADIOLOGY REPORT*  Clinical Data: Preoperative radiograph.  Left hip revision.  CHEST - 2 VIEW  Comparison: 07/06/2005 chest CT.  Findings: Tortuous thoracic aorta.  No airspace disease.  No effusion.  Atelectasis or scarring at the left costophrenic angle. Cardiopericardial silhouette within normal limits for projection. Dextroconvex thoracolumbar scoliosis.  IMPRESSION: No active cardiopulmonary disease.   Original Report Authenticated By: Andreas Newport, M.D.    Dg Hip Complete Left  01/05/2012  *RADIOLOGY REPORT*  Clinical Data: Left total hip arthroplasty.  LEFT HIP - COMPLETE 2+ VIEW  Comparison: 07/06/2004.  Findings: Bilateral total hip arthroplasties are present.  There is a vertical orientation of the left acetabular cup.  Acetabular screw remains present.  There is no periprosthetic fracture. Femoral stem appears within normal limits. Bilateral SI joint degenerative disease.  IMPRESSION:  1.  Bilateral total hip arthroplasties. 2.  Vertical orientation of the left acetabular cup.  The visualized portions of the acetabular screw appear unchanged.   Original Report  Authenticated By: Andreas Newport, M.D.    Dg Pelvis Portable  01/05/2012  *RADIOLOGY REPORT*  Clinical Data: Postop left hip replacement.  PORTABLE PELVIS  Comparison: 01/11/2007  Findings: Interval revision of the left hip replacement.  Soft tissue drain in place.  There is soft tissue gas.  Normal alignment.  No hardware or bony complicating feature.  Remote right hip replacement.  IMPRESSION: Interval revision of the left hip replacement.  No complicating feature.   Original Report Authenticated By: Cyndie Chime, M.D.    Dg Hip Portable 1 View Left  01/05/2012  *RADIOLOGY REPORT*  Clinical Data: Postop left hip replacement.  PORTABLE LEFT HIP - 1 VIEW  Comparison: 01/11/2007  Findings: Interval revision of the left hip replacement.  No hardware or bony complicating feature.  Normal AP alignment.  Soft tissue drain in place.  IMPRESSION: Interval revision of left hip replacement.  No complicating feature.   Original Report Authenticated By: Cyndie Chime, M.D.     EKG: Orders placed during the hospital encounter of 01/05/12  . EKG 12-LEAD  . EKG 12-LEAD     Hospital Course: Patient was admitted to [facility] and taken to the OR and underwent the above state procedure without complications.  Patient tolerated the procedure well and was later transferred to the recovery room and then to the orthopaedic floor for postoperative care.  They were given PO and IV analgesics for pain control following their surgery.  They were given 24 hours of postoperative antibiotics and started on DVT prophylaxis in the form of Xarelto.   PT and OT were ordered for total hip protocol.  The patient was allowed to be Partial Weight Bearing 25-50% Left Leg with therapy. Discharge planning was consulted to help with postop disposition and equipment needs.  Patient had a decent night on the evening of surgery and started to get up OOB with therapy on day one.  Hemovac drain was pulled without difficulty.  The  knee immobilizer was removed and discontinued.  Continued to work  with therapy into day two.  HGB was down though to 8.1. Asymptomatic at that time but had not been up yet. Wanted to go home so we gave one unit and then home later that day after two sessions of therapy. Dressing was changed on day two and the incision was healing well. Patient was seen in rounds and was ready to go home later that afternoon.   Discharge Medications: Prior to Admission medications   Medication Sig Start Date End Date Taking? Authorizing Provider  losartan (COZAAR) 50 MG tablet Take 50 mg by mouth every morning.    Yes Historical Provider, MD  metoprolol (TOPROL-XL) 50 MG 24 hr tablet Take 50 mg by mouth every morning.    Yes Historical Provider, MD  omeprazole (PRILOSEC) 20 MG capsule Take 20 mg by mouth daily.    Yes Historical Provider, MD  rOPINIRole (REQUIP XL) 2 MG 24 hr tablet Take 2 mg by mouth every evening. Patient takes at 1730pm   Yes Historical Provider, MD  triamterene-hydrochlorothiazide (MAXZIDE-25) 37.5-25 MG per tablet Take 0.5 tablets by mouth every morning.   Yes Historical Provider, MD  ferrous fumarate (HEMOCYTE - 106 MG FE) 325 (106 FE) MG TABS Take 37 mg of iron by mouth 2 (two) times a week. Pt takes two times weekly - Monday and thursdays    Historical Provider, MD  HYDROmorphone (DILAUDID) 2 MG tablet Take 1-2 tablets (2-4 mg total) by mouth every 4 (four) hours as needed. 01/07/12   Alexzandrew Julien Girt, PA  methocarbamol (ROBAXIN) 500 MG tablet Take 1 tablet (500 mg total) by mouth every 6 (six) hours as needed. 01/07/12   Alexzandrew Julien Girt, PA  rivaroxaban (XARELTO) 10 MG TABS tablet Take 1 tablet (10 mg total) by mouth daily with breakfast. Take Xarelto for two and a half more weeks, then discontinue Xarelto. Once the patient has completed the Xarelto, they may resume the 81 mg Aspirin. 01/07/12   Alexzandrew Julien Girt, PA    Diet: Cardiac diet Activity:PWB 25-50% No bending hip over 90  degrees- A "L" Angle Do not cross legs Do not let foot roll inward When turning these patients a pillow should be placed between the patient's legs to prevent crossing. Patients should have the affected knee fully extended when trying to sit or stand from all surfaces to prevent excessive hip flexion. When ambulating and turning toward the affected side the affected leg should have the toes turned out prior to moving the walker and the rest of patient's body as to prevent internal rotation/ turning in of the leg. Abduction pillows are the most effective way to prevent a patient from not crossing legs or turning toes in at rest. If an abduction pillow is not ordered placing a regular pillow length wise between the patient's legs is also an effective reminder. It is imperative that these precautions be maintained so that the surgical hip does not dislocate. Follow-up:in 2 weeks Disposition - Home Discharged Condition: good   Discharge Orders    Future Orders Please Complete By Expires   Diet - low sodium heart healthy      Call MD / Call 911      Comments:   If you experience chest pain or shortness of breath, CALL 911 and be transported to the hospital emergency room.  If you develope a fever above 101 F, pus (white drainage) or increased drainage or redness at the wound, or calf pain, call your surgeon's office.   Discharge instructions  Comments:   Pick up stool softner and laxative for home. Do not submerge incision under water. May shower. Continue to use ice for pain and swelling from surgery. Hip precautions.  Total Hip Protocol.  Take Xarelto for two and a half more weeks, then discontinue Xarelto. Once the patient has completed the Xarelto, they may resume the 81 mg Aspirin.   Constipation Prevention      Comments:   Drink plenty of fluids.  Prune juice may be helpful.  You may use a stool softener, such as Colace (over the counter) 100 mg twice a day.  Use MiraLax (over  the counter) for constipation as needed.   Increase activity slowly as tolerated      Patient may shower      Comments:   You may shower without a dressing once there is no drainage.  Do not wash over the wound.  If drainage remains, do not shower until drainage stops.   Driving restrictions      Comments:   No driving until released by the physician.   Lifting restrictions      Comments:   No lifting until released by the physician.   Follow the hip precautions as taught in Physical Therapy      Change dressing      Comments:   You may change your dressing dressing daily with sterile 4 x 4 inch gauze dressing and paper tape.  Do not submerge the incision under water.   TED hose      Comments:   Use stockings (TED hose) for 3 weeks on both leg(s).  You may remove them at night for sleeping.   Do not sit on low chairs, stoools or toilet seats, as it may be difficult to get up from low surfaces          Medication List     As of 01/07/2012  8:28 AM    STOP taking these medications         aspirin EC 81 MG tablet      CALTRATE 600+D PLUS PO      TAKE these medications         ferrous fumarate 325 (106 FE) MG Tabs   Commonly known as: HEMOCYTE - 106 mg FE   Take 37 mg of iron by mouth 2 (two) times a week. Pt takes two times weekly - Monday and thursdays      HYDROmorphone 2 MG tablet   Commonly known as: DILAUDID   Take 1-2 tablets (2-4 mg total) by mouth every 4 (four) hours as needed.      losartan 50 MG tablet   Commonly known as: COZAAR   Take 50 mg by mouth every morning.      methocarbamol 500 MG tablet   Commonly known as: ROBAXIN   Take 1 tablet (500 mg total) by mouth every 6 (six) hours as needed.      metoprolol succinate 50 MG 24 hr tablet   Commonly known as: TOPROL-XL   Take 50 mg by mouth every morning.      omeprazole 20 MG capsule   Commonly known as: PRILOSEC   Take 20 mg by mouth daily.      rivaroxaban 10 MG Tabs tablet   Commonly known as:  XARELTO   Take 1 tablet (10 mg total) by mouth daily with breakfast. Take Xarelto for two and a half more weeks, then discontinue Xarelto.  Once the patient has completed the Xarelto,  they may resume the 81 mg Aspirin.      rOPINIRole 2 MG 24 hr tablet   Commonly known as: REQUIP XL   Take 2 mg by mouth every evening. Patient takes at 1730pm      triamterene-hydrochlorothiazide 37.5-25 MG per tablet   Commonly known as: MAXZIDE-25   Take 0.5 tablets by mouth every morning.           Follow-up Information    Follow up with Loanne Drilling, MD. Schedule an appointment as soon as possible for a visit in 2 weeks.   Contact information:   [facility] [address] [place] [address] [phone]          Signed: Patrica Duel 01/07/2012, 8:28 AM

## 2012-01-07 NOTE — Progress Notes (Signed)
Physical Therapy Treatment Patient Details Name: Nancy Baxter MRN: [id number] DOB: 15-Sep-1940 Today's Date: 01/07/2012 Time: 8295-6213 PT Time Calculation (min): 58 min  PT Assessment / Plan / Recommendation Comments on Treatment Session  Pt and spouse educated on stairs, transfers, use of stool for high bed and truck, car transfers, shower transfers    Follow Up Recommendations  Home health PT    Barriers to Discharge        Equipment Recommendations  None recommended by PT;None recommended by OT    Recommendations for Other Services OT consult  Frequency 7X/week   Plan Discharge plan remains appropriate    Precautions / Restrictions Precautions Precautions: Posterior Hip Precaution Comments: Pt recalls 2/3 THP without cues Restrictions Weight Bearing Restrictions: Yes LLE Weight Bearing: Partial weight bearing LLE Partial Weight Bearing Percentage or Pounds: 25-50   Pertinent Vitals/Pain     Mobility  Bed Mobility Bed Mobility: Supine to Sit;Sit to Supine Supine to Sit: 4: Min assist Sit to Supine: 4: Min assist Details for Bed Mobility Assistance: increased time, assist with L LE , in/out bed x 2 Transfers Transfers: Sit to Stand;Stand to Sit Sit to Stand: 4: Min guard;5: Supervision Stand to Sit: 4: Min guard;5: Supervision Details for Transfer Assistance: cues for THP, use of UEs and for LE management Ambulation/Gait Ambulation/Gait Assistance: 4: Min guard;5: Supervision Ambulation Distance (Feet): 40 Feet (twice) Assistive device: Rolling walker Ambulation/Gait Assistance Details: min cues for posture and position from RW Gait Pattern: Step-to pattern Gait velocity: slow Stairs: Yes Stairs Assistance: 4: Min assist Stairs Assistance Details (indicate cue type and reason): cues for sequence, foot/crutch, RW placement Stair Management Technique: One rail Right;Step to pattern;Forwards;With crutches Number of Stairs: 4     Exercises     PT Diagnosis:     PT Problem List:   PT Treatment Interventions:     PT Goals Acute Rehab PT Goals PT Goal Formulation: With patient Time For Goal Achievement: 01/10/12 Potential to Achieve Goals: Good Pt will go Supine/Side to Sit: with supervision PT Goal: Supine/Side to Sit - Progress: Progressing toward goal Pt will go Sit to Supine/Side: with supervision PT Goal: Sit to Supine/Side - Progress: Progressing toward goal Pt will go Sit to Stand: with supervision PT Goal: Sit to Stand - Progress: Progressing toward goal Pt will go Stand to Sit: with supervision PT Goal: Stand to Sit - Progress: Progressing toward goal Pt will Ambulate: 51 - 150 feet;with supervision;with rolling walker PT Goal: Ambulate - Progress: Progressing toward goal Pt will Go Up / Down Stairs: 3-5 stairs;with min assist;with least restrictive assistive device PT Goal: Up/Down Stairs - Progress: Progressing toward goal  Visit Information  Last PT Received On: 01/07/12 Assistance Needed: +1    Subjective Data  Subjective: I get to go home Patient Stated Goal: Resume previous lifestyle with decreased pain   Cognition  Overall Cognitive Status: Appears within functional limits for tasks assessed/performed Arousal/Alertness: Awake/alert Orientation Level: Appears intact for tasks assessed Behavior During Session: [name] for tasks performed    Balance     End of Session PT - End of Session Equipment Utilized During Treatment: Gait belt Activity Tolerance: Patient tolerated treatment well Patient left: with call bell/phone within reach;with family/visitor present;in bed Nurse Communication: Mobility status   GP     Nancy Baxter 01/07/2012, 4:25 PM

## 2012-01-08 LAB — TYPE AND SCREEN: ABO/RH(D): O POS

## 2012-03-02 ENCOUNTER — Ambulatory Visit: Payer: Medicare Other | Attending: Orthopedic Surgery

## 2012-03-02 DIAGNOSIS — R5381 Other malaise: Secondary | ICD-10-CM | POA: Insufficient documentation

## 2012-03-02 DIAGNOSIS — M6281 Muscle weakness (generalized): Secondary | ICD-10-CM | POA: Insufficient documentation

## 2012-03-02 DIAGNOSIS — M25559 Pain in unspecified hip: Secondary | ICD-10-CM | POA: Insufficient documentation

## 2012-03-02 DIAGNOSIS — IMO0001 Reserved for inherently not codable concepts without codable children: Secondary | ICD-10-CM | POA: Insufficient documentation

## 2012-03-02 DIAGNOSIS — R262 Difficulty in walking, not elsewhere classified: Secondary | ICD-10-CM | POA: Insufficient documentation

## 2012-03-02 DIAGNOSIS — Z96649 Presence of unspecified artificial hip joint: Secondary | ICD-10-CM | POA: Insufficient documentation

## 2012-03-07 ENCOUNTER — Ambulatory Visit: Payer: Medicare Other

## 2012-03-08 ENCOUNTER — Ambulatory Visit: Payer: Medicare Other

## 2012-03-14 ENCOUNTER — Ambulatory Visit: Payer: Medicare Other | Attending: Orthopedic Surgery

## 2012-03-14 DIAGNOSIS — M25559 Pain in unspecified hip: Secondary | ICD-10-CM | POA: Insufficient documentation

## 2012-03-14 DIAGNOSIS — R262 Difficulty in walking, not elsewhere classified: Secondary | ICD-10-CM | POA: Insufficient documentation

## 2012-03-14 DIAGNOSIS — R5381 Other malaise: Secondary | ICD-10-CM | POA: Insufficient documentation

## 2012-03-14 DIAGNOSIS — Z96649 Presence of unspecified artificial hip joint: Secondary | ICD-10-CM | POA: Insufficient documentation

## 2012-03-14 DIAGNOSIS — M6281 Muscle weakness (generalized): Secondary | ICD-10-CM | POA: Insufficient documentation

## 2012-03-14 DIAGNOSIS — IMO0001 Reserved for inherently not codable concepts without codable children: Secondary | ICD-10-CM | POA: Insufficient documentation

## 2012-03-16 ENCOUNTER — Ambulatory Visit: Payer: Medicare Other

## 2012-03-17 ENCOUNTER — Ambulatory Visit: Payer: Medicare Other

## 2012-03-20 ENCOUNTER — Ambulatory Visit: Payer: Medicare Other

## 2012-03-22 ENCOUNTER — Ambulatory Visit: Payer: Medicare Other

## 2012-03-23 ENCOUNTER — Ambulatory Visit: Payer: Medicare Other

## 2012-03-27 ENCOUNTER — Ambulatory Visit: Payer: Medicare Other

## 2012-03-29 ENCOUNTER — Ambulatory Visit: Payer: Medicare Other

## 2012-03-31 ENCOUNTER — Ambulatory Visit: Payer: Medicare Other

## 2012-04-03 ENCOUNTER — Ambulatory Visit: Payer: Medicare Other

## 2012-04-04 ENCOUNTER — Ambulatory Visit: Payer: Medicare Other

## 2012-07-24 ENCOUNTER — Encounter (HOSPITAL_BASED_OUTPATIENT_CLINIC_OR_DEPARTMENT_OTHER)
Admission: RE | Admit: 2012-07-24 | Discharge: 2012-07-24 | Disposition: A | Discharge status: Home / Self Care | Payer: Medicare Other | Source: Ambulatory Visit | Attending: Orthopedic Surgery | Admitting: Orthopedic Surgery

## 2012-07-24 ENCOUNTER — Other Ambulatory Visit: Payer: Self-pay | Admitting: Orthopedic Surgery

## 2012-07-24 ENCOUNTER — Encounter (HOSPITAL_BASED_OUTPATIENT_CLINIC_OR_DEPARTMENT_OTHER): Payer: Self-pay | Admitting: *Deleted

## 2012-07-24 LAB — BASIC METABOLIC PANEL
BUN: 30 mg/dL — ABNORMAL HIGH (ref 6–23)
Chloride: 99 mEq/L (ref 96–112)
GFR calc Af Amer: 41 mL/min — ABNORMAL LOW (ref >90)
Potassium: 4.8 mEq/L (ref 3.5–5.1)
Sodium: 135 mEq/L (ref 135–145)

## 2012-07-24 NOTE — Progress Notes (Signed)
Pt has had 3 total hips-last 9/13 revision lt hip-hx long time ago murmur-echo 20 yr ago-did not have to have any f/up-and no cardiac clearance for surgeries- To come in for bmet

## 2012-07-26 ENCOUNTER — Encounter (HOSPITAL_BASED_OUTPATIENT_CLINIC_OR_DEPARTMENT_OTHER): Payer: Self-pay | Admitting: Anesthesiology

## 2012-07-26 ENCOUNTER — Encounter (HOSPITAL_BASED_OUTPATIENT_CLINIC_OR_DEPARTMENT_OTHER)
Admission: RE | Disposition: A | Discharge status: Home / Self Care | Payer: Self-pay | Source: Ambulatory Visit | Attending: Orthopedic Surgery

## 2012-07-26 ENCOUNTER — Ambulatory Visit (HOSPITAL_BASED_OUTPATIENT_CLINIC_OR_DEPARTMENT_OTHER)
Admission: RE | Admit: 2012-07-26 | Discharge: 2012-07-26 | Disposition: A | Discharge status: Home / Self Care | Payer: Medicare Other | Source: Ambulatory Visit | Attending: Orthopedic Surgery | Admitting: Orthopedic Surgery

## 2012-07-26 ENCOUNTER — Encounter (HOSPITAL_BASED_OUTPATIENT_CLINIC_OR_DEPARTMENT_OTHER): Payer: Self-pay

## 2012-07-26 ENCOUNTER — Ambulatory Visit (HOSPITAL_BASED_OUTPATIENT_CLINIC_OR_DEPARTMENT_OTHER): Payer: Medicare Other | Admitting: Anesthesiology

## 2012-07-26 DIAGNOSIS — M6530 Trigger finger, unspecified finger: Secondary | ICD-10-CM | POA: Insufficient documentation

## 2012-07-26 DIAGNOSIS — Z9109 Other allergy status, other than to drugs and biological substances: Secondary | ICD-10-CM | POA: Insufficient documentation

## 2012-07-26 DIAGNOSIS — Z882 Allergy status to sulfonamides status: Secondary | ICD-10-CM | POA: Insufficient documentation

## 2012-07-26 DIAGNOSIS — R011 Cardiac murmur, unspecified: Secondary | ICD-10-CM | POA: Insufficient documentation

## 2012-07-26 DIAGNOSIS — K219 Gastro-esophageal reflux disease without esophagitis: Secondary | ICD-10-CM | POA: Insufficient documentation

## 2012-07-26 DIAGNOSIS — D649 Anemia, unspecified: Secondary | ICD-10-CM | POA: Insufficient documentation

## 2012-07-26 DIAGNOSIS — Z885 Allergy status to narcotic agent status: Secondary | ICD-10-CM | POA: Insufficient documentation

## 2012-07-26 DIAGNOSIS — I10 Essential (primary) hypertension: Secondary | ICD-10-CM | POA: Insufficient documentation

## 2012-07-26 DIAGNOSIS — M129 Arthropathy, unspecified: Secondary | ICD-10-CM | POA: Insufficient documentation

## 2012-07-26 DIAGNOSIS — Z888 Allergy status to other drugs, medicaments and biological substances status: Secondary | ICD-10-CM | POA: Insufficient documentation

## 2012-07-26 DIAGNOSIS — Z79899 Other long term (current) drug therapy: Secondary | ICD-10-CM | POA: Insufficient documentation

## 2012-07-26 HISTORY — PX: TRIGGER FINGER RELEASE: SHX641

## 2012-07-26 HISTORY — DX: Cardiac murmur, unspecified: R01.1

## 2012-07-26 SURGERY — RELEASE, A1 PULLEY, FOR TRIGGER FINGER
Anesthesia: Regional | Site: Finger | Laterality: Left | Wound class: Clean

## 2012-07-26 MED ORDER — ONDANSETRON HCL 4 MG/2ML IJ SOLN
INTRAMUSCULAR | Status: DC | PRN
  Administered 2012-07-26: 4 mg via INTRAVENOUS

## 2012-07-26 MED ORDER — PROPOFOL 10 MG/ML IV EMUL
INTRAVENOUS | Status: DC | PRN
  Administered 2012-07-26: 75 ug/kg/min via INTRAVENOUS

## 2012-07-26 MED ORDER — CEFAZOLIN SODIUM-DEXTROSE 2-3 GM-% IV SOLR: 2.0000 g | INTRAVENOUS | Status: DC

## 2012-07-26 MED ORDER — CHLORHEXIDINE GLUCONATE 4 % EX LIQD: 60.0000 mL | Freq: Once | CUTANEOUS | Status: DC

## 2012-07-26 MED ORDER — PENTAZOCINE-NALOXONE 50-0.5 MG PO TABS: 1.0000 | ORAL_TABLET | ORAL | Status: DC | PRN

## 2012-07-26 MED ORDER — BUPIVACAINE HCL (PF) 0.25 % IJ SOLN
INTRAMUSCULAR | Status: DC | PRN
  Administered 2012-07-26: 4 mL

## 2012-07-26 MED ORDER — MIDAZOLAM HCL 5 MG/5ML IJ SOLN
INTRAMUSCULAR | Status: DC | PRN
  Administered 2012-07-26: 1 mg via INTRAVENOUS

## 2012-07-26 MED ORDER — EPHEDRINE SULFATE 50 MG/ML IJ SOLN
INTRAMUSCULAR | Status: DC | PRN
  Administered 2012-07-26: 10 mg via INTRAVENOUS

## 2012-07-26 MED ORDER — LACTATED RINGERS IV SOLN
INTRAVENOUS | Status: DC
  Administered 2012-07-26: 08:00:00 via INTRAVENOUS

## 2012-07-26 MED ORDER — LIDOCAINE HCL (PF) 0.5 % IJ SOLN
INTRAMUSCULAR | Status: DC | PRN
  Administered 2012-07-26: 30 mL via INTRAVENOUS

## 2012-07-26 MED ORDER — FENTANYL CITRATE 0.05 MG/ML IJ SOLN
INTRAMUSCULAR | Status: DC | PRN
  Administered 2012-07-26 (×2): 50 ug via INTRAVENOUS

## 2012-07-26 MED ORDER — 0.9 % SODIUM CHLORIDE (POUR BTL) OPTIME
TOPICAL | Status: DC | PRN
  Administered 2012-07-26: 1000 mL

## 2012-07-26 SURGICAL SUPPLY — 37 items
BANDAGE COBAN STERILE 2 (GAUZE/BANDAGES/DRESSINGS) ×2 IMPLANT
BLADE SURG 15 STRL LF DISP TIS (BLADE) ×1 IMPLANT
BLADE SURG 15 STRL SS (BLADE) ×2
BNDG CMPR 9X4 STRL LF SNTH (GAUZE/BANDAGES/DRESSINGS)
BNDG ESMARK 4X9 LF (GAUZE/BANDAGES/DRESSINGS) IMPLANT
CHLORAPREP W/TINT 26ML (MISCELLANEOUS) ×2 IMPLANT
CLOTH BEACON ORANGE TIMEOUT ST (SAFETY) ×2 IMPLANT
CORDS BIPOLAR (ELECTRODE) IMPLANT
COVER MAYO STAND STRL (DRAPES) ×2 IMPLANT
COVER TABLE BACK 60X90 (DRAPES) ×2 IMPLANT
CUFF TOURNIQUET SINGLE 18IN (TOURNIQUET CUFF) ×1 IMPLANT
DECANTER SPIKE VIAL GLASS SM (MISCELLANEOUS) IMPLANT
DRAPE EXTREMITY T 121X128X90 (DRAPE) ×2 IMPLANT
DRAPE SURG 17X23 STRL (DRAPES) ×2 IMPLANT
GAUZE XEROFORM 1X8 LF (GAUZE/BANDAGES/DRESSINGS) ×2 IMPLANT
GLOVE BIO SURGEON STRL SZ 6.5 (GLOVE) ×1 IMPLANT
GLOVE BIOGEL PI IND STRL 7.0 (GLOVE) IMPLANT
GLOVE BIOGEL PI IND STRL 8.5 (GLOVE) ×1 IMPLANT
GLOVE BIOGEL PI INDICATOR 7.0 (GLOVE) ×1
GLOVE BIOGEL PI INDICATOR 8.5 (GLOVE) ×1
GLOVE ECLIPSE 6.5 STRL STRAW (GLOVE) ×1 IMPLANT
GLOVE EXAM NITRILE MD LF STRL (GLOVE) ×1 IMPLANT
GLOVE SURG ORTHO 8.0 STRL STRW (GLOVE) ×2 IMPLANT
GOWN BRE IMP PREV XXLGXLNG (GOWN DISPOSABLE) ×2 IMPLANT
GOWN PREVENTION PLUS XLARGE (GOWN DISPOSABLE) ×2 IMPLANT
NEEDLE 27GAX1X1/2 (NEEDLE) ×1 IMPLANT
NS IRRIG 1000ML POUR BTL (IV SOLUTION) ×2 IMPLANT
PACK BASIN DAY SURGERY FS (CUSTOM PROCEDURE TRAY) ×2 IMPLANT
PADDING CAST ABS 4INX4YD NS (CAST SUPPLIES) ×1
PADDING CAST ABS COTTON 4X4 ST (CAST SUPPLIES) ×1 IMPLANT
SPONGE GAUZE 4X4 12PLY (GAUZE/BANDAGES/DRESSINGS) ×2 IMPLANT
STOCKINETTE 4X48 STRL (DRAPES) ×2 IMPLANT
SUT VICRYL RAPIDE 4/0 PS 2 (SUTURE) ×2 IMPLANT
SYR BULB 3OZ (MISCELLANEOUS) ×2 IMPLANT
SYR CONTROL 10ML LL (SYRINGE) ×1 IMPLANT
TOWEL OR 17X24 6PK STRL BLUE (TOWEL DISPOSABLE) ×4 IMPLANT
UNDERPAD 30X30 INCONTINENT (UNDERPADS AND DIAPERS) ×2 IMPLANT

## 2012-07-26 NOTE — Anesthesia Preprocedure Evaluation (Signed)
Anesthesia Evaluation  Patient identified by MRN, date of birth, ID band Patient awake    Reviewed: Allergy & Precautions, H&P , NPO status , Patient's Chart, lab work & pertinent test results  History of Anesthesia Complications (+) PONV and Family history of anesthesia reaction  Airway Mallampati: I  Neck ROM: Full    Dental  (+) Teeth Intact   Pulmonary neg pulmonary ROS,  breath sounds clear to auscultation        Cardiovascular hypertension, Rhythm:Regular Rate:Normal     Neuro/Psych    GI/Hepatic GERD-  ,  Endo/Other    Renal/GU      Musculoskeletal   Abdominal   Peds  Hematology   Anesthesia Other Findings   Reproductive/Obstetrics                           Anesthesia Physical Anesthesia Plan  ASA: II  Anesthesia Plan: MAC and Bier Block   Post-op Pain Management:    Induction: Intravenous  Airway Management Planned: Simple Face Mask  Additional Equipment:   Intra-op Plan:   Post-operative Plan:   Informed Consent: I have reviewed the patients History and Physical, chart, labs and discussed the procedure including the risks, benefits and alternatives for the proposed anesthesia with the patient or authorized representative who has indicated his/her understanding and acceptance.     Plan Discussed with: CRNA and Surgeon  Anesthesia Plan Comments:         Anesthesia Quick Evaluation

## 2012-07-26 NOTE — Op Note (Signed)
Dictation Number [id number]

## 2012-07-26 NOTE — Brief Op Note (Signed)
07/26/2012  9:14 AM  PATIENT:  Baxter Flattery  72 y.o. female  PRE-OPERATIVE DIAGNOSIS:  STENOSING TENOSYNOVITIS LEFT SMALL FINGER  POST-OPERATIVE DIAGNOSIS:  STENOSING TENOSYNOVITIS LEFT SMALL FINGER  PROCEDURE:  Procedure(s): RELEASE TRIGGER FINGER/A-1 PULLEY LEFT SMALL FINGER (Left)  SURGEON:  Surgeon(s) and Role:    * Nicki Reaper, MD - Primary  PHYSICIAN ASSISTANT:   ASSISTANTS: none   ANESTHESIA:   local and regional  EBL:  Total I/O In: 600 [I.V.:600] Out: -   BLOOD ADMINISTERED:none  DRAINS: none   LOCAL MEDICATIONS USED:  MARCAINE     SPECIMEN:  No Specimen  DISPOSITION OF SPECIMEN:  N/A  COUNTS:  YES  TOURNIQUET:   Total Tourniquet Time Documented: Forearm (Left) - 20 minutes Total: Forearm (Left) - 20 minutes   DICTATION: .Other Dictation: Dictation Number [id number]  PLAN OF CARE: Discharge to home after PACU  PATIENT DISPOSITION:  PACU - hemodynamically stable.

## 2012-07-26 NOTE — H&P (Signed)
Nancy Baxter is 63 and right handed. She is complaining of locking of her left little finger. This has been going on for 6 months and progressively getting worse. She has not had any treatment for this. She cannot tolerate injections. She has had an STS release right little finger. She is complaining of her left small finger.  PAST MEDICAL HISTORY: She has allergy to steroids, sulfa and Codeine. Medications include HCTZ, Cozaar, Toprol, Requip, Aleve, aspirin, iron, Prilosec, and Glucosamine. Past surgery includes foot surgery, total hip, hysterectomy, tonsillectomy, and spine fusion.  FAMILY H ISTORY: Positive for diabetes, heart disease high BP and arthritis.  REVIEW OF SYSTEMS: Positive for cataracts, high BP, otherwise negative. Nancy Baxter is an 72 y.o. female.   Chief Complaint: sts lsf HPI: see above  Past Medical History  Diagnosis Date  . Complication of anesthesia   . PONV (postoperative nausea and vomiting)   . Hypertension   . GERD (gastroesophageal reflux disease)   . Arthritis   . Anemia   . Heart murmur     evaluated 28yr ago-no cardiac follow up needed    Past Surgical History  Procedure Laterality Date  . Back surgery  1961    spinal fusion age   . Vaginal hysterectomy    . Tonsillectomy      age 5  . Foot arthrodesis  5/12    hammer toes,bunionectomy  . Trigger finger release  02/23/2011    Procedure: RELEASE TRIGGER FINGER/A-1 PULLEY;  Surgeon: Nicki Reaper, MD;  Location: [facility];  Service: Orthopedics;  Laterality: Right;  . Ear cyst excision  02/23/2011  . Total hip revision  01/05/2012    Procedure: TOTAL HIP REVISION;  Surgeon: Loanne Drilling, MD;  Location: [facility];  Service: Orthopedics;  Laterality: Left;  . Joint replacement  2006    rt hip  . Joint replacement  2008    lt hip    Family History  Problem Relation Age of Onset  . Colon cancer Neg Hx   . Esophageal cancer Neg Hx   . Rectal cancer Neg Hx   . Stomach  cancer Neg Hx    Social History:  reports that she has never smoked. She has never used smokeless tobacco. She reports that  drinks alcohol. She reports that she does not use illicit drugs.  Allergies:  Allergies  Allergen Reactions  . Sulfa Antibiotics Hives  . Codeine Nausea And Vomiting  . Prednisone Other (See Comments)    Shuts down adrenals  . Zolpidem Tartrate Other (See Comments)    sleepwalks  . Adhesive (Tape) Rash    blisters    Medications Prior to Admission  Medication Sig Dispense Refill  . calcium carbonate (OS-CAL) 1250 MG chewable tablet Chew 1 tablet by mouth daily.      . ferrous fumarate (HEMOCYTE - 106 MG FE) 325 (106 FE) MG TABS Take 37 mg of iron by mouth 2 (two) times a week. Pt takes two times weekly - Monday and thursdays      . losartan (COZAAR) 50 MG tablet Take 50 mg by mouth every morning.       . metoprolol (TOPROL-XL) 50 MG 24 hr tablet Take 50 mg by mouth every morning.       Marland Kitchen omeprazole (PRILOSEC) 20 MG capsule Take 20 mg by mouth daily.       Marland Kitchen rOPINIRole (REQUIP XL) 2 MG 24 hr tablet Take 2 mg by mouth every evening. Patient  takes at 1730pm      . triamterene-hydrochlorothiazide (MAXZIDE-25) 37.5-25 MG per tablet Take 0.5 tablets by mouth every morning.        Results for orders placed during the hospital encounter of 07/26/12 (from the past 48 hour(s))  BASIC METABOLIC PANEL     Status: Abnormal   Collection Time    07/24/12  1:00 PM      Result Value Range   Sodium 135  135 - 145 mEq/L   Potassium 4.8  3.5 - 5.1 mEq/L   Chloride 99  96 - 112 mEq/L   CO2 27  19 - 32 mEq/L   Glucose, Bld 87  70 - 99 mg/dL   BUN 30 (*) 6 - 23 mg/dL   Creatinine, Ser 2.13 (*) 0.50 - 1.10 mg/dL   Calcium 9.5  8.4 - 08.6 mg/dL   GFR calc non Af Amer 35 (*) >90 mL/min   GFR calc Af Amer 41 (*) >90 mL/min   Comment:            The eGFR has been calculated     using the CKD EPI equation.     This calculation has not been     validated in all clinical      situations.     eGFR's persistently     <90 mL/min signify     possible Chronic Kidney Disease.    No results found.   Pertinent items are noted in HPI.  Blood pressure 137/77, pulse 68, temperature 97.6 F (36.4 C), temperature source Oral, resp. rate 18, height 5\' 1"  (1.549 m), weight 65.942 kg (145 lb 6 oz), SpO2 98.00%.  General appearance: alert, cooperative and appears stated age Head: Normocephalic, without obvious abnormality Neck: no JVD Resp: clear to auscultation bilaterally Cardio: regular rate and rhythm, S1, S2 normal, no murmur, click, rub or gallop GI: soft, non-tender; bowel sounds normal; no masses,  no organomegaly Extremities: extremities normal, atraumatic, no cyanosis or edema Pulses: 2+ and symmetric Skin: Skin color, texture, turgor normal. No rashes or lesions Neurologic: Grossly normal Incision/Wound: na  Assessment/Plan STS lt small finger  She would like to proceed to have this surgically released. The pre, peri and post op course are discussed along with risks and complications. She is aware there is no guarantee with surgery, possibility of infection, recurrence, injury to arteries, nerves and tendons, incomplete relief of symptoms, possibility of further fingers beginning to trigger and dystrophy. She is scheduled for release A-1 pulley, excision of probable small cyst flexor sheath left small finger as an outpatient under regional anesthesia.  Izayiah Tibbitts R 07/26/2012, 7:55 AM

## 2012-07-26 NOTE — Anesthesia Postprocedure Evaluation (Signed)
  Anesthesia Post-op Note  Patient: Nancy Baxter  Procedure(s) Performed: Procedure(s): RELEASE TRIGGER FINGER/A-1 PULLEY LEFT SMALL FINGER (Left)  Patient Location: PACU  Anesthesia Type:MAC and Bier block  Level of Consciousness: awake and alert   Airway and Oxygen Therapy: Patient Spontanous Breathing  Post-op Pain: mild  Post-op Assessment: Post-op Vital signs reviewed, Patient's Cardiovascular Status Stable, Respiratory Function Stable, Patent Airway, No signs of Nausea or vomiting and Pain level controlled  Post-op Vital Signs: stable  Complications: No apparent anesthesia complications

## 2012-07-26 NOTE — Transfer of Care (Signed)
Immediate Anesthesia Transfer of Care Note  Patient: Nancy Baxter  Procedure(s) Performed: Procedure(s): RELEASE TRIGGER FINGER/A-1 PULLEY LEFT SMALL FINGER (Left)  Patient Location: PACU  Anesthesia Type:Bier block  Level of Consciousness: awake, sedated and patient cooperative  Airway & Oxygen Therapy: Patient Spontanous Breathing and Patient connected to face mask oxygen  Post-op Assessment: Report given to PACU RN and Post -op Vital signs reviewed and stable  Post vital signs: Reviewed and stable  Complications: No apparent anesthesia complications

## 2012-07-27 ENCOUNTER — Encounter (HOSPITAL_BASED_OUTPATIENT_CLINIC_OR_DEPARTMENT_OTHER): Payer: Self-pay | Admitting: Orthopedic Surgery

## 2012-07-27 NOTE — Op Note (Signed)
NAME:  Nancy Baxter, Nancy Baxter               ACCOUNT NO.:  [id number]  MEDICAL RECORD NO.:  [id number]  LOCATION:                                 FACILITY:  PHYSICIAN:  Cindee Salt, M.D.       DATE OF BIRTH:  1940-04-20  DATE OF PROCEDURE:  07/26/2012 DATE OF DISCHARGE:                              OPERATIVE REPORT   PREOPERATIVE DIAGNOSIS:  Stenosing tenosynovitis, left small finger.  POSTOPERATIVE DIAGNOSIS:  Stenosing tenosynovitis, left small finger.  OPERATION:  Release of A1 pulley, left small finger with partial tenosynovectomy, profundus superficialis.  SURGEON:  Cindee Salt, M.D.  ASSISTANT:  None.  ANESTHESIA:  Forearm-based IV regional with local infiltration.  ANESTHESIOLOGIST:  [place].  HISTORY:  The patient is a 72 year old female with a history of triggering and locking of her left small finger.  She has elected to undergo surgical release of this.  Pre, peri, and postoperative course have been discussed along with risks and complications.  She is aware that there is no guarantee with surgery; possibility of infection; recurrence of injury to arteries, nerves, tendons, complete release of the symptoms of dystrophy.  In the preoperative area, the patient is seen, the extremity marked by both the patient and surgeon.  Antibiotic given.  PROCEDURE IN DETAIL:  The patient was brought to the operating room where forearm-based IV regional anesthetic was carried out without difficulty.  She was prepped using ChloraPrep, supine position, left arm free.  A 3-minute dry time was allowed.  Time-out taken, confirmed the patient and procedure.  The limb had been exsanguinated with a block. An oblique incision was made over the A1 pulley of the left small finger, carried down through the subcutaneous tissue.  Neurovascular bundles were identified and protected.  Retractors placed.  Very thick A1 pulley with a cyst was present.  The cyst was excised.  The A1 pulley was released on  its radial aspect.  A very significant tenosynovitis was present between the superficialis profundus tendon.  This was excised. The finger was easily able to be passed through a full range motion with no further triggering or locking.  The wound was copiously irrigated with saline.  The skin was closed with interrupted 4-0 Vicryl Rapide sutures.  Local infiltration with 0.25% Marcaine without epinephrine was given, 4 mL was used.  Sterile compressive dressing with the fingers free.  No splint was applied.  On deflation of the tourniquet, all fingers immediately pinked.  She was taken to the recovery room for observation in satisfactory condition.  She will be discharged home to [facility] in 1 week on [place].          ______________________________ Cindee Salt, M.D.     GK/MEDQ  D:  07/26/2012  T:  07/26/2012  Job:  [id number]

## 2012-09-14 ENCOUNTER — Telehealth: Payer: Self-pay

## 2012-09-14 NOTE — Telephone Encounter (Signed)
Patient normally gets BMN Requip XL and pays out of pocket because it is not covered on insurance.  Brand Name Requip XL is currently on Back Order and it is unknown when the medication will be available again.  The pharmacy sent [place] a fax notifying us of this and asking if they can change the patient to generic.  Okay to change?  Please advise.  Thank you.

## 2012-09-18 ENCOUNTER — Telehealth: Payer: Self-pay | Admitting: Neurology

## 2012-09-18 MED ORDER — ROPINIROLE HCL ER 2 MG PO TB24: 2.0000 mg | ORAL_TABLET | Freq: Every evening | ORAL | Status: DC

## 2012-09-18 NOTE — Telephone Encounter (Signed)
Duplicate Message,  Rx has already been sent.

## 2012-09-18 NOTE — Telephone Encounter (Signed)
Okayed to you was generic a week up until the extended release form is available again.Nancy Baxter

## 2012-12-13 ENCOUNTER — Other Ambulatory Visit: Payer: Self-pay | Admitting: Neurology

## 2012-12-25 ENCOUNTER — Ambulatory Visit (INDEPENDENT_AMBULATORY_CARE_PROVIDER_SITE_OTHER): Payer: Medicare Other | Admitting: Nurse Practitioner

## 2012-12-25 ENCOUNTER — Encounter: Payer: Self-pay | Admitting: Nurse Practitioner

## 2012-12-25 VITALS — BP 124/66 | HR 64 | Ht 59.0 in | Wt 151.0 lb

## 2012-12-25 DIAGNOSIS — G544 Lumbosacral root disorders, not elsewhere classified: Secondary | ICD-10-CM | POA: Insufficient documentation

## 2012-12-25 DIAGNOSIS — G2581 Restless legs syndrome: Secondary | ICD-10-CM

## 2012-12-25 MED ORDER — REQUIP XL 2 MG PO TB24: 2.0000 mg | ORAL_TABLET | Freq: Every evening | ORAL | Status: DC | PRN

## 2012-12-25 MED ORDER — ROPINIROLE HCL 0.25 MG PO TABS: 0.2500 mg | ORAL_TABLET | Freq: Every day | ORAL | Status: DC

## 2012-12-25 NOTE — Patient Instructions (Addendum)
Take Requip XL 2 hrs before bed Continue Requip .25 mg at noon F/U  6 months

## 2012-12-25 NOTE — Progress Notes (Signed)
[facility]  PATIENT: Nancy Baxter DOB: [birth year]   REASON FOR VISIT:  Restless leg syndrome    HISTORY OF PRESENT ILLNESS: This is a 72 year old  caucasian female presents for a revisit. She has only been seen in this office with Dr. Vickey Huger , last visit 12/22/2011 Nancy Baxter has a decade history of restless legs, and found relief from Requip - responded very well to Requip XL and stated " it gave me my life back"/ she recently observed that the Requip may trigger some restless legs in the earlier day time . 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   Restless legs do not interfere  with this . she is taking her extended release Requip around 7:00 at night she does not go to bed until 11 or 12.    REVIEW OF SYSTEMS: Full 14 system review of systems performed and notable only for:  Constitutional: N/A  Cardiovascular: N/A  Ear/Nose/Throat: N/A  Skin: N/A  Eyes: N/A  Respiratory: N/A  Gastroitestinal: N/A  Hematology/Lymphatic: N/A  Endocrine: N/A Musculoskeletal:N/A  Allergy/Immunology: N/A  Neurological: Restless legs Psychiatric: N/A   ALLERGIES: Allergies  Allergen Reactions  . Sulfa Antibiotics Hives  . Codeine Nausea And Vomiting  . Prednisone Other (See Comments)    Shuts down adrenals  . Zolpidem Tartrate Other (See Comments)    sleepwalks  . Adhesive [Tape] Rash    blisters    HOME MEDICATIONS: Outpatient Prescriptions Prior to Visit  Medication Sig Dispense Refill  . calcium carbonate (OS-CAL) 1250 MG chewable tablet Chew 1 tablet by mouth daily.      . ferrous fumarate (HEMOCYTE - 106 MG FE) 325 (106 FE) MG TABS Take 37 mg of iron by mouth 2 (two) times a week. Pt takes two times weekly - Monday and thursdays      . losartan (COZAAR) 50 MG tablet Take 50 mg by mouth every  morning.       . metoprolol (TOPROL-XL) 50 MG 24 hr tablet Take 50 mg by mouth every morning.       Marland Kitchen omeprazole (PRILOSEC) 20 MG capsule Take 20 mg by mouth daily.       . REQUIP XL 2 MG 24 hr tablet TAKE ONE TABLET BY MOUTH EVERY DAY  90 tablet  0  . triamterene-hydrochlorothiazide (MAXZIDE-25) 37.5-25 MG per tablet Take 0.5 tablets by mouth every morning.      . pentazocine-naloxone (TALWIN NX) 50-0.5 MG per tablet Take 1 tablet by mouth every 4 (four) hours as needed for pain.  30 tablet  0   No facility-administered medications prior to visit.    PAST MEDICAL HISTORY: Past Medical History  Diagnosis Date  . Complication of anesthesia   . PONV (postoperative nausea and vomiting)   . Hypertension   . GERD (gastroesophageal reflux disease)   . Arthritis   . Anemia   . Heart murmur     evaluated 54yr ago-no cardiac follow up needed    PAST SURGICAL HISTORY: Past Surgical History  Procedure Laterality Date  . Back surgery  1961    spinal fusion age   . Vaginal hysterectomy    . Tonsillectomy      age 84  . Foot arthrodesis  5/12    hammer toes,bunionectomy  . Trigger finger release  02/23/2011    Procedure: RELEASE  TRIGGER FINGER/A-1 PULLEY;  Surgeon: Nicki Reaper, MD;  Location: [facility];  Service: Orthopedics;  Laterality: Right;  . Ear cyst excision  02/23/2011  . Total hip revision  01/05/2012    Procedure: TOTAL HIP REVISION;  Surgeon: Loanne Drilling, MD;  Location: [facility];  Service: Orthopedics;  Laterality: Left;  . Joint replacement  2006    rt hip  . Joint replacement  2008    lt hip  . Trigger finger release Left 07/26/2012    Procedure: RELEASE TRIGGER FINGER/A-1 PULLEY LEFT SMALL FINGER;  Surgeon: Nicki Reaper, MD;  Location:  SURGERY CENTER;  Service: Orthopedics;  Laterality: Left;    FAMILY HISTORY: Family History  Problem Relation Age of Onset  . Colon cancer Neg Hx   . Esophageal cancer Neg Hx   . Rectal cancer Neg Hx   .  Stomach cancer Neg Hx     SOCIAL HISTORY: History   Social History  . Marital Status: Married    Spouse Name: Morrie Sheldon     Number of Children: 5  . Years of Education: College   Occupational History  . Not on file.   Social History Main Topics  . Smoking status: Never Smoker   . Smokeless tobacco: Never Used  . Alcohol Use: Yes     Comment: occasional  . Drug Use: No  . Sexual Activity: Not on file   Other Topics Concern  . Not on file   Social History Narrative   Patient is retired and married.    Patient has a college degree.    Patient has 5 children.      PHYSICAL EXAM  Filed Vitals:   12/25/12 1020  BP: 124/66  Pulse: 64  Height: 4\' 11"  (1.499 m)  Weight: 151 lb (68.493 kg)   Body mass index is 30.48 kg/(m^2).  Generalized: Well developed, in no acute distress  Head: normocephalic and atraumatic,. Oropharynx benign  Neck: Supple, no carotid bruits  Cardiac: Regular rate rhythm, no murmur  Musculoskeletal: No deformity   Neurological examination   Mentation: Alert oriented to time, place, history taking. Follows all commands speech and language fluent  Cranial nerve II-XII: Pupils were equal round reactive to light extraocular movements were full, visual field were full on confrontational test. Facial sensation and strength were normal. hearing was intact to finger rubbing bilaterally. Uvula tongue midline. head turning and shoulder shrug and were normal and symmetric.Tongue protrusion into cheek strength was normal. Motor: normal bulk and tone, full strength in the BUE, BLE, fine finger movements normal, no pronator drift. No focal weakness Sensory: normal and symmetric to light touch, pinprick, and  vibration  Coordination: finger-nose-finger, heel-to-shin bilaterally, no dysmetria Reflexes: 1+ and symmetric upper and lower Gait and Station: Rising up from seated position without assistance, normal stance, moderate stride, good arm swing, smooth turning,  able to perform tiptoe, and heel walking without difficulty. Tandem gait is steady DIAGNOSTIC DATA (LABS, IMAGING, TESTING) - I reviewed patient records, labs, notes, testing and imaging myself where available.      Component Value Date/Time   NA 135 07/24/2012 1300   K 4.8 07/24/2012 1300   CL 99 07/24/2012 1300   CO2 27 07/24/2012 1300   GLUCOSE 87 07/24/2012 1300   BUN 30* 07/24/2012 1300   CREATININE 1.44* 07/24/2012 1300   CALCIUM 9.5 07/24/2012 1300   PROT 6.9 01/05/2012 0550   ALBUMIN 3.6 01/05/2012 0550   AST 20 01/05/2012 0550  ALT 11 01/05/2012 0550   ALKPHOS 111 01/05/2012 0550   BILITOT 0.4 01/05/2012 0550   GFRNONAA 35* 07/24/2012 1300   GFRAA 41* 07/24/2012 1300     ASSESSMENT AND PLAN  72 y.o. year old female  has a past medical history of Hypertension;  Arthritis; Anemia; and restless leg syndrome and here for followup.  Take Requip XL 2 hrs before bed Continue Requip .25 mg at noon, may add additional dose at later date F/U  6 months    Nilda Riggs, [facility], [facility], APRN  [facility] [address], [address] [place], [place] [address] [phone]

## 2013-03-13 ENCOUNTER — Other Ambulatory Visit: Payer: Self-pay | Admitting: Dermatology

## 2013-04-24 ENCOUNTER — Ambulatory Visit (HOSPITAL_COMMUNITY)
Admission: RE | Admit: 2013-04-24 | Discharge: 2013-04-24 | Disposition: A | Discharge status: Home / Self Care | Payer: Medicare Other | Source: Ambulatory Visit | Attending: Vascular Surgery | Admitting: Vascular Surgery

## 2013-04-24 ENCOUNTER — Other Ambulatory Visit (HOSPITAL_COMMUNITY): Payer: Self-pay | Admitting: Family Medicine

## 2013-04-24 DIAGNOSIS — I83893 Varicose veins of bilateral lower extremities with other complications: Secondary | ICD-10-CM | POA: Insufficient documentation

## 2013-04-24 DIAGNOSIS — M79609 Pain in unspecified limb: Secondary | ICD-10-CM | POA: Insufficient documentation

## 2013-04-24 NOTE — Progress Notes (Signed)
Preliminary results phoned to [place] FNP @ [place] 13:41 today.

## 2013-06-01 ENCOUNTER — Telehealth: Payer: Self-pay | Admitting: Nurse Practitioner

## 2013-06-01 NOTE — Telephone Encounter (Signed)
Called patient and schedule an appt on 06/04/13 at 9 am. I advised the patient that if she has any other problems, questions or concerns to call the office. Patient verbalized understanding.

## 2013-06-01 NOTE — Telephone Encounter (Signed)
I called pt to advised her apt for 06/27/13 was needing to be r/s due to CM will be in meetings. CM next availability will be in June, pt states she can't wait that long.  Pt states REQUIP XL 2 MG 24 hr tablet is not working and needs to be seen. Pt would like for nurse to call her to see if they have something sooner.

## 2013-06-04 ENCOUNTER — Ambulatory Visit (INDEPENDENT_AMBULATORY_CARE_PROVIDER_SITE_OTHER): Payer: Medicare Other | Admitting: Nurse Practitioner

## 2013-06-04 ENCOUNTER — Encounter: Payer: Self-pay | Admitting: Nurse Practitioner

## 2013-06-04 ENCOUNTER — Encounter (INDEPENDENT_AMBULATORY_CARE_PROVIDER_SITE_OTHER): Payer: Self-pay

## 2013-06-04 VITALS — BP 137/79 | HR 71 | Ht 59.0 in | Wt 153.0 lb

## 2013-06-04 DIAGNOSIS — G2581 Restless legs syndrome: Secondary | ICD-10-CM

## 2013-06-04 DIAGNOSIS — G544 Lumbosacral root disorders, not elsewhere classified: Secondary | ICD-10-CM

## 2013-06-04 NOTE — Progress Notes (Signed)
[facility]  PATIENT: Nancy Baxter DOB: 04/28/1940   REASON FOR VISIT:Followup for restless legs    HISTORY OF PRESENT ILLNESS:[name], 72 year old female returns for follow up. She claims that her Requip XL is no longer working for her. She also states her last 3 month RX was generic not brand. She says she can take the medicine and her legs begin to be restless.She continues with short acting dose in the afternoon. She has not given up any activities, she remains very active.  She returns for reevaluation.      HISTORY: decade history of restless legs, and found relief from Requip - responded very well to Requip XL and stated " it gave me my life back"/  she recently observed that the Requip may trigger some restless legs in the earlier day time .  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  Restless legs do not interfere with this . she is taking her extended release Requip around 7:00 at night she does not go to bed until 11 or 12.      REVIEW OF SYSTEMS: Full 14 system review of systems performed and notable only for those listed, all others are neg:  Constitutional: N/A  Cardiovascular: N/A  Ear/Nose/Throat: N/A  Skin: N/A  Eyes: N/A  Respiratory: N/A  Gastroitestinal: N/A  Hematology/Lymphatic: N/A  Endocrine: N/A Musculoskeletal:back pain Allergy/Immunology: N/A  Neurological: N/A Psychiatric: N/A Sleep restless legs   ALLERGIES: Allergies  Allergen Reactions  . Sulfa Antibiotics Hives  . Codeine Nausea And Vomiting  . Prednisone Other (See Comments)    Shuts down adrenals  . Zolpidem Tartrate Other (See Comments)    sleepwalks  . Adhesive [Tape] Rash    blisters    HOME MEDICATIONS: Outpatient Prescriptions Prior to Visit  Medication Sig Dispense Refill  . calcium carbonate  (OS-CAL) 1250 MG chewable tablet Chew 1 tablet by mouth daily.      . ferrous fumarate (HEMOCYTE - 106 MG FE) 325 (106 FE) MG TABS Take 37 mg of iron by mouth 2 (two) times a week. Pt takes two times weekly - Monday and thursdays      . losartan (COZAAR) 50 MG tablet Take 50 mg by mouth every morning.       . metoprolol (TOPROL-XL) 50 MG 24 hr tablet Take 50 mg by mouth every morning.       Marland Kitchen omeprazole (PRILOSEC) 20 MG capsule Take 20 mg by mouth daily.       . REQUIP XL 2 MG 24 hr tablet Take 1 tablet (2 mg total) by mouth at bedtime as needed and may repeat dose one time if needed.  90 tablet  1  . rOPINIRole (REQUIP) 0.25 MG tablet Take 1 tablet (0.25 mg total) by mouth daily after lunch.  90 tablet  1  . triamterene-hydrochlorothiazide (MAXZIDE-25) 37.5-25 MG per tablet Take 0.5 tablets by mouth every morning.       No facility-administered medications prior to visit.    PAST MEDICAL HISTORY: Past Medical History  Diagnosis Date  . Complication of anesthesia   . PONV (postoperative nausea and vomiting)   . Hypertension   . GERD (gastroesophageal reflux disease)   . Arthritis   . Anemia   . Heart murmur     evaluated 6yr ago-no cardiac follow up  needed    PAST SURGICAL HISTORY: Past Surgical History  Procedure Laterality Date  . Back surgery  1961    spinal fusion age   . Vaginal hysterectomy    . Tonsillectomy      age 59  . Foot arthrodesis  5/12    hammer toes,bunionectomy  . Trigger finger release  02/23/2011    Procedure: RELEASE TRIGGER FINGER/A-1 PULLEY;  Surgeon: Wynonia Sours, MD;  Location: [facility];  Service: Orthopedics;  Laterality: Right;  . Ear cyst excision  02/23/2011  . Total hip revision  01/05/2012    Procedure: TOTAL HIP REVISION;  Surgeon: Gearlean Alf, MD;  Location: [facility];  Service: Orthopedics;  Laterality: Left;  . Joint replacement  2006    rt hip  . Joint replacement  2008    lt hip  . Trigger finger release Left  07/26/2012    Procedure: RELEASE TRIGGER FINGER/A-1 PULLEY LEFT SMALL FINGER;  Surgeon: Wynonia Sours, MD;  Location: [place];  Service: Orthopedics;  Laterality: Left;    FAMILY HISTORY: Family History  Problem Relation Age of Onset  . Colon cancer Neg Hx   . Esophageal cancer Neg Hx   . Rectal cancer Neg Hx   . Stomach cancer Neg Hx     SOCIAL HISTORY: History   Social History  . Marital Status: Married    Spouse Name: [name]     Number of Children: 5  . Years of Education: College   Occupational History  .      retired   Social History Main Topics  . Smoking status: Never Smoker   . Smokeless tobacco: Never Used  . Alcohol Use: 0.0 oz/week     Comment: occasional  . Drug Use: No  . Sexual Activity: Not on file   Other Topics Concern  . Not on file   Social History Narrative   Patient is retired and married. [name])   Patient has a college degree.    Patient has 5 children.    Right handed   Caffeine two cups of caffeine daily     PHYSICAL EXAM  Filed Vitals:   06/04/13 0911  BP: 137/79  Pulse: 71  Height: 4\' 11"  (1.499 m)  Weight: 153 lb (69.4 kg)   Body mass index is 30.89 kg/(m^2).  Generalized: Well developed, in no acute distress   Neurological examination   Mentation: Alert oriented to time, place, history taking. Follows all commands speech and language fluent  Cranial nerve II-XII: Pupils were equal round reactive to light extraocular movements were full, visual field were full on confrontational test. Facial sensation and strength were normal. hearing was intact to finger rubbing bilaterally. Uvula tongue midline. head turning and shoulder shrug were normal and symmetric.Tongue protrusion into cheek strength was normal. Motor: normal bulk and tone, full strength in the BUE, BLE, fine finger movements normal, no pronator drift. No focal weakness Sensory: normal and symmetric to light touch, pinprick, and  vibration    Coordination: finger-nose-finger, heel-to-shin bilaterally, no dysmetria Reflexes: 1+ upper lower and symmetric Gait and Station: Rising up from seated position without assistance, normal stance,  moderate stride, good arm swing, smooth turning, able to perform tiptoe, and heel walking without difficulty. Tandem gait is steady  DIAGNOSTIC DATA (LABS, IMAGING, TESTING) -  ASSESSMENT AND PLAN  73 y.o. year old female  has a past medical history of  Hypertension;  Arthritis; Anemia; and restless legs here to follow  up. She was switched to generic Requip XL by the pharmacy and it is not working.   Patient has failed generic Requip XL and we will attempt to get PA Please call insurance company and inquire what meds are covered for restless leg syndrome F/U 6 months Discussed with Dr. De Nurse, [facility], [facility], [facility] [address], [place], [place] [address] [phone]

## 2013-06-04 NOTE — Patient Instructions (Signed)
Patient has failed generic Requip XL and we will attempt to get PA Please call insurance company and inquire what meds are covered for restless leg syndrome F/U 6 months

## 2013-06-11 ENCOUNTER — Telehealth: Payer: Self-pay | Admitting: Neurology

## 2013-06-11 MED ORDER — REQUIP XL 2 MG PO TB24: 2.0000 mg | ORAL_TABLET | Freq: Every day | ORAL | Status: DC

## 2013-06-11 NOTE — Telephone Encounter (Signed)
Pt called and stated that per [employer] told her to change to Ropinirol 2 mg extended release tablets and not the ones with the hydrochloride.  She asked if these could be called in for her.  Please call if necessary.  Thank you

## 2013-06-11 NOTE — Telephone Encounter (Signed)
Last OV says the patient has tried and failed Ropinerole ER (generic Requip XL) and they were to contact ins regarding what meds are covered.  I sent a prior auth request to ins on 02/23 asking that they cover Brand Name Requip XL.  I have not gotten a response back from them yet.  I called [place] [employer].  Spoke with [employer]  She said the request was approved and is effective until 06/04/2014.  There is a delay in them responding due to the inclement weather from last week.  They were closed for multiple days.  I have sent a Rx to the pharmacy for Brand Name Requip

## 2013-06-27 ENCOUNTER — Ambulatory Visit: Payer: Medicare Other | Admitting: Nurse Practitioner

## 2013-07-27 ENCOUNTER — Other Ambulatory Visit: Payer: Self-pay | Admitting: Nurse Practitioner

## 2013-12-04 ENCOUNTER — Encounter: Payer: Self-pay | Admitting: Neurology

## 2013-12-04 ENCOUNTER — Ambulatory Visit (INDEPENDENT_AMBULATORY_CARE_PROVIDER_SITE_OTHER): Payer: Medicare Other | Admitting: Neurology

## 2013-12-04 VITALS — BP 125/80 | HR 62 | Resp 16 | Ht 60.0 in | Wt 152.0 lb

## 2013-12-04 DIAGNOSIS — G2581 Restless legs syndrome: Secondary | ICD-10-CM

## 2013-12-04 MED ORDER — ROTIGOTINE 1 MG/24HR TD PT24: MEDICATED_PATCH | TRANSDERMAL | Status: DC

## 2013-12-04 MED ORDER — ROPINIROLE HCL 0.25 MG PO TABS: ORAL_TABLET | ORAL | Status: DC

## 2013-12-04 MED ORDER — ROPINIROLE HCL ER 2 MG PO TB24: 4.0000 mg | ORAL_TABLET | Freq: Every day | ORAL | Status: DC

## 2013-12-04 MED ORDER — REQUIP XL 2 MG PO TB24: 2.0000 mg | ORAL_TABLET | Freq: Every day | ORAL | Status: DC

## 2013-12-04 NOTE — Progress Notes (Signed)
[facility]  PATIENT: Nancy Baxter DOB: 1940-09-22   REASON FOR VISIT:Followup for restless legs    HISTORY OF PRESENT ILLNESS:[name], 73 year old female returns for follow up.  She claims that generic  Requip XL is not  working for her. We provided brand name, and she felt not better .  She says she can take the medicine and her legs begin to be restless. She noticed that a glass of red wine has also a detrimental affect. Prilosec seemed to decrease the effect, too.  She continues with short acting dose in the afternoon. She has not given up any activities, she remains very active.  She returns for reevaluation.      HISTORY: decade history of restless legs, and found relief from Requip - responded very well to Requip XL and stated " it gave me my life back"/  she recently observed that the generic Requip may trigger some restless legs in the earlier day time .  has no other problems, she takes an additional dose of her Requip  every day, a short-acting dose , currently at 3 PM. 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  Restless legs do not interfere with this . she is taking her extended release Requip around 8:30 at night she does not go to bed until 11 or 12 PM.      REVIEW OF SYSTEMS: Full 14 system review of systems performed and notable only for those listed, all others are neg:  Constitutional: N/A  Cardiovascular: no palpitation.  Ear/Nose/Throat: N/A  Skin: N/A  Eyes: N/A  Respiratory: N/A  Gastroitestinal: N/A  Hematology/Lymphatic: N/A  Endocrine: N/A Musculoskeletal:back pain, third hip replacement. Titanium hip corroded. Now ceramic .   Allergy/Immunology: N/A  Neurological: N/A Psychiatric: N/A Sleep restless legs- beginning earlier 3PM.    ALLERGIES: Allergies  Allergen Reactions  . Sulfa Antibiotics Hives  . Codeine Nausea And Vomiting  . Prednisone Other (See Comments)    Shuts down  adrenals  . Zolpidem Tartrate Other (See Comments)    sleepwalks  . Adhesive [Tape] Rash    blisters    HOME MEDICATIONS: Outpatient Prescriptions Prior to Visit  Medication Sig Dispense Refill  . calcium carbonate (OS-CAL) 1250 MG chewable tablet Chew 1 tablet by mouth daily.      . ferrous fumarate (HEMOCYTE - 106 MG FE) 325 (106 FE) MG TABS Take 37 mg of iron by mouth 2 (two) times a week. Pt takes two times weekly - Monday and thursdays      . fluorouracil (EFUDEX) 5 % cream       . losartan (COZAAR) 50 MG tablet Take 50 mg by mouth every morning.       . metoprolol (TOPROL-XL) 50 MG 24 hr tablet Take 50 mg by mouth every morning.       Marland Kitchen omeprazole (PRILOSEC) 20 MG capsule Take 20 mg by mouth daily.       . REQUIP XL 2 MG 24 hr tablet Take 1 tablet (2 mg total) by mouth at bedtime.  90 tablet  1  . rOPINIRole (REQUIP) 0.25 MG tablet TAKE ONE TABLET BY MOUTH DAILY AFTER LUNCH.  90 tablet  1  . triamterene-hydrochlorothiazide (MAXZIDE-25) 37.5-25 MG per tablet Take 0.5 tablets by mouth every morning.       No facility-administered medications prior to visit.    PAST MEDICAL HISTORY: Past Medical History  Diagnosis Date  . Complication  of anesthesia   . PONV (postoperative nausea and vomiting)   . Hypertension   . GERD (gastroesophageal reflux disease)   . Arthritis   . Anemia   . Heart murmur     evaluated 63yr ago-no cardiac follow up needed    PAST SURGICAL HISTORY: Past Surgical History  Procedure Laterality Date  . Back surgery  1961    spinal fusion age   . Vaginal hysterectomy    . Tonsillectomy      age 15  . Foot arthrodesis  5/12    hammer toes,bunionectomy  . Trigger finger release  02/23/2011    Procedure: RELEASE TRIGGER FINGER/A-1 PULLEY;  Surgeon: Wynonia Sours, MD;  Location: [place];  Service: Orthopedics;  Laterality: Right;  . Ear cyst excision  02/23/2011  . Total hip revision  01/05/2012    Procedure: TOTAL HIP REVISION;   Surgeon: Gearlean Alf, MD;  Location: [facility];  Service: Orthopedics;  Laterality: Left;  . Joint replacement  2006    rt hip  . Joint replacement  2008    lt hip  . Trigger finger release Left 07/26/2012    Procedure: RELEASE TRIGGER FINGER/A-1 PULLEY LEFT SMALL FINGER;  Surgeon: Wynonia Sours, MD;  Location: [place];  Service: Orthopedics;  Laterality: Left;    FAMILY HISTORY: Family History  Problem Relation Age of Onset  . Colon cancer Neg Hx   . Esophageal cancer Neg Hx   . Rectal cancer Neg Hx   . Stomach cancer Neg Hx     SOCIAL HISTORY: History   Social History  . Marital Status: Married    Spouse Name: [name]     Number of Children: 5  . Years of Education: College   Occupational History  .      retired   Social History Main Topics  . Smoking status: Never Smoker   . Smokeless tobacco: Never Used  . Alcohol Use: 0.0 oz/week     Comment: occasional  . Drug Use: No  . Sexual Activity: Not on file   Other Topics Concern  . Not on file   Social History Narrative   Patient is retired and married. [name])   Patient has some college education.    Patient has 5 children.    Right handed   Caffeine two cups of caffeine daily ( coffee)     PHYSICAL EXAM  Filed Vitals:   12/04/13 1321  BP: 125/80  Pulse: 62  Resp: 16  Height: 5' (1.524 m)  Weight: 152 lb (68.947 kg)   Body mass index is 29.69 kg/(m^2).  Generalized: Well developed, in no acute distress   Neurological examination   Mentation: Alert oriented to time, place, history taking. Follows all commands speech and language fluent  Cranial nerve II-XII:  Pupils were equal round reactive to light extraocular movements were full, visual field were full on confrontational test. Facial sensation and strength were normal. hearing was intact to finger rubbing bilaterally. Uvula tongue midline. head turning and shoulder shrug were normal and symmetric.Tongue protrusion into cheek  strength was normal. Motor:  Normal bulk and tone, full strength in the BUE, BLE, fine finger movements normal, no pronator drift. No focal weakness in the upper extremities. She was able to bear full weight on the left leg.   Sensory:  normal and symmetric to light touch, pinprick, and  vibration  Coordination:  finger-nose-finger, heel-to-shin bilaterally, no dysmetria Reflexes:  1+ upper lower and  symmetric Gait and Station:  Rising up from seated position without assistance, normal stance.   DIAGNOSTIC DATA (LABS, IMAGING, TESTING)  Reviewed Dr. Maretta Los notes.    ASSESSMENT AND PLAN  73 y.o. year old female  With acute onset of restless legs  After first hip surgery 7 years ago. Since than moderate relief on Requip. Here to follow up.  She was switched to generic Requip XL by the pharmacy and it is not working. We discussed the Nupro patch.  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   Patient has failed generic Requip XL and we will attempt to get PA Please call insurance company and inquire what meds are covered for restless leg syndrome F/U q 6 months Dennie Bible, [facility], [facility], APRN  [facility] [address], [place], [place] [address] [phone]

## 2013-12-04 NOTE — Addendum Note (Signed)
Addended by: Larey Seat on: 12/04/2013 02:35 PM   Modules accepted: Orders

## 2013-12-04 NOTE — Patient Instructions (Signed)
Rotigotine topical What is this medicine? ROTIGOTINE (roe TIG oh teen) is used to control the signs and symptoms of Parkinson's disease or restless legs syndrome. This medicine may be used for other purposes; ask your health care provider or pharmacist if you have questions. COMMON BRAND NAME(S): Neupro What should I tell my health care provider before I take this medicine? They need to know if you have any of these conditions: -asthma -heart disease -high blood pressure -sleep disorder -mental illness -an unusual or allergic reaction to rotigotine, sulfites, other medicines, foods, dyes, or preservatives -pregnant or trying to get pregnant -breast-feeding How should I use this medicine? This medicine is for external use only. Follow the directions on the prescription label. Use exactly as directed. Wash hands after removing and applying this medicine. Change the patch each day at the same time. Apply the patch to an area of the upper arm or body that is clean, dry, and hairless. Do not use this patch on skin that is injured, irritated, oily, or calloused. Do not apply where the patch will be rubbed by tight clothing or a waistband. Do not apply to the same place more than once every 14 days in order to prevent skin irritation. Do not cut or trim the patch. Take your medicine at regular intervals. Do not take it more often than directed. Do not stop taking except on your doctor's advice. Always remove the old patch before you apply a new one. Remove patch slowly and carefully to avoid irritation. After removal, fold the patch so that it sticks to itself and throw it away. After removal of patch, wash the area with soap and water to remove any drug or adhesive. Baby oil or mineral oil may be used if needed. Do not use alcohol or other liquids. Talk to your pediatrician regarding the use of this medicine in children. Special care may be needed. Overdosage: If you think you have taken too much of  this medicine contact a poison control center or emergency room at once. NOTE: This medicine is only for you. Do not share this medicine with others. What if I miss a dose? If you miss a dose, take it as soon as you can. If it is almost time for your next dose, take only that dose. Do not take double or extra doses. What may interact with this medicine? -alcohol -droperidol -haloperidol -loxapine -medicines for anxiety or sleep -metoclopramide -memantine -phenothiazines like chlorpromazine, mesoridazine, prochlorperazine, thioridazine -pimozide -thiothixene This list may not describe all possible interactions. Give your health care provider a list of all the medicines, herbs, non-prescription drugs, or dietary supplements you use. Also tell them if you smoke, drink alcohol, or use illegal drugs. Some items may interact with your medicine. What should I watch for while using this medicine? Visit your doctor for regular check ups. Tell your doctor or healthcare professional if your symptoms do not start to get better or if they get worse. You may get drowsy or dizzy. Do not drive, use machinery, or do anything that needs mental alertness until you know how this medicine affects you. Do not stand or sit up quickly, especially if you are an older patient. This reduces the risk of dizzy or fainting spells. Alcohol may interfere with the effect of this medicine. Avoid alcoholic drinks. If you find that you have sudden feelings of wanting to sleep during normal activities, like cooking, watching television, or while driving or riding in a car, you should contact your  health care professional. There have been reports of increased sexual urges or other strong urges such as gambling while taking this medicine. If you experience any of these while taking this medicine, you should report this to your health care provider as soon as possible. Heating this patch may change the amount of this medicine that you  get from the patch. Avoid exposing the patch to heating pads, electric blankets, heat lamps, saunas, hot tubs, and heated water beds. Do not wear this patch on skin that gets hot from direct sunlight. If you are going to have a magnetic resonance imaging (MRI) procedure, tell your MRI technician if you have this patch on your body. It must be removed before a MRI. What side effects may I notice from receiving this medicine? Side effects that you should report to your doctor or health care professional as soon as possible: -allergic reactions like skin rash, itching or hives, swelling of the face, lips, or tongue -anxiety, restlessness -breathing problems -confusion -dizziness -falling asleep during normal activities like driving -fast, irregular or slow heartbeat -feeling faint or lightheaded, falls -hallucination, loss of contact with reality -skin irritation, redness, or swelling -uncontrollable head, mouth, neck, arm, or leg movements Side effects that usually do not require medical attention (report to your prescriber or health care professional if they continue or are bothersome): -constipation -difficulty sleeping -headache -loss of appetite -nausea, vomiting -stomach pain -weight gain This list may not describe all possible side effects. Call your doctor for medical advice about side effects. You may report side effects to FDA at 1-800-FDA-1088. Where should I keep my medicine? Keep out of the reach of children. Store at room temperature between 15 and 30 degrees C (59 and 86 degrees F). Keep container tightly closed. Store in original pouch until just before use. Throw away any unused medicine after the expiration date. NOTE: This sheet is a summary. It may not cover all possible information. If you have questions about this medicine, talk to your doctor, pharmacist, or health care provider.  2015, [employer]. (2013-06-12 10:49:42)

## 2013-12-14 ENCOUNTER — Encounter: Payer: Self-pay | Admitting: Internal Medicine

## 2014-02-27 ENCOUNTER — Encounter: Payer: Self-pay | Admitting: Neurology

## 2014-03-05 ENCOUNTER — Encounter: Payer: Self-pay | Admitting: Neurology

## 2014-03-12 ENCOUNTER — Telehealth: Payer: Self-pay | Admitting: Neurology

## 2014-03-12 ENCOUNTER — Ambulatory Visit (INDEPENDENT_AMBULATORY_CARE_PROVIDER_SITE_OTHER): Payer: Medicare Other | Admitting: Nurse Practitioner

## 2014-03-12 ENCOUNTER — Encounter: Payer: Self-pay | Admitting: Nurse Practitioner

## 2014-03-12 ENCOUNTER — Ambulatory Visit: Payer: Medicare Other | Admitting: Nurse Practitioner

## 2014-03-12 VITALS — BP 125/76 | HR 65 | Temp 97.9°F | Ht 60.0 in | Wt 153.0 lb

## 2014-03-12 DIAGNOSIS — G2581 Restless legs syndrome: Secondary | ICD-10-CM

## 2014-03-12 MED ORDER — ROPINIROLE HCL 0.25 MG PO TABS: ORAL_TABLET | ORAL | Status: DC

## 2014-03-12 NOTE — Progress Notes (Signed)
[facility]  PATIENT: Nancy Baxter DOB: 10/22/1940   REASON FOR VISIT: Follow-up for restless legs    HISTORY OF PRESENT ILLNESS:Nancy Baxter, 73 year old female returns for follow up. She has a history of restless legs and has failed generic  Requip XL. She is currently on name brand which requires prior AUTHORIZATION. She was last seen in this office by Dr. Brett Fairy and was given samples of Neupro patch however she felt it made her restless legs worse and did not get the prescription filled .  She noticed that a glass of red wine has also a detrimental affect and also caffeine. Prilosec seemed to decrease the effect, too.  She continues with short acting dose in the afternoon. She has not given up any activities, she remains very active. She walks for exercise She returns for reevaluation.   HISTORY: decade history of restless legs, and found relief from Requip - responded very well to Requip XL and stated " it gave me my life back"/  she recently observed that the generic Requip may trigger some restless legs in the earlier day time .  has no other problems, she takes an additional dose of her Requip every day, a short-acting dose , currently at 3 PM. 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  Restless legs do not interfere with this . she is taking her extended release Requip around 8:30 at night she does not go to bed until 11 or 12 PM.    REVIEW OF SYSTEMS: Full 14 system review of systems performed and notable only for those listed, all others are neg:  Constitutional: N/A  Cardiovascular: N/A  Ear/Nose/Throat: N/A  Skin: N/A  Eyes: N/A  Respiratory: N/A  Gastroitestinal: N/A  Hematology/Lymphatic: N/A  Endocrine: N/A Musculoskeletal: Joint pain, back pain Allergy/Immunology: N/A  Neurological: N/A Psychiatric: N/A Sleep : Restless legs   ALLERGIES: Allergies  Allergen Reactions  . Sulfa  Antibiotics Hives  . Codeine Nausea And Vomiting  . Prednisone Other (See Comments)    Shuts down adrenals  . Zolpidem Tartrate Other (See Comments)    sleepwalks  . Adhesive [Tape] Rash    blisters    HOME MEDICATIONS: Outpatient Prescriptions Prior to Visit  Medication Sig Dispense Refill  . calcium carbonate (OS-CAL) 1250 MG chewable tablet Chew 1 tablet by mouth daily.    . ferrous fumarate (HEMOCYTE - 106 MG FE) 325 (106 FE) MG TABS Take 37 mg of iron by mouth 2 (two) times a week. Pt takes two times weekly - Monday and thursdays    . fluorouracil (EFUDEX) 5 % cream     . losartan (COZAAR) 50 MG tablet Take 50 mg by mouth every morning.     . metoprolol (TOPROL-XL) 50 MG 24 hr tablet Take 50 mg by mouth every morning.     Marland Kitchen omeprazole (PRILOSEC) 20 MG capsule Take 20 mg by mouth daily.     . REQUIP XL 2 MG 24 hr tablet Take 1 tablet (2 mg total) by mouth at bedtime. 90 tablet 1  . rOPINIRole (REQUIP) 0.25 MG tablet Use at 3 Pm po. 90 tablet 1  . triamterene-hydrochlorothiazide (MAXZIDE-25) 37.5-25 MG per tablet Take 0.5 tablets by mouth every morning.    Marland Kitchen rOPINIRole (REQUIP XL) 2 MG 24 hr tablet Take 2 tablets (4 mg total) by mouth at bedtime. Take 2 at bedtime. BRAND NAME ONLY (Patient not taking: Reported on 03/12/2014) 180 tablet  3  . Rotigotine (NEUPRO) 1 MG/24HR PT24 Samples given , 1 mg / 24 hours , 2 packs. (Patient not taking: Reported on 03/12/2014) 7 patch 0   No facility-administered medications prior to visit.    PAST MEDICAL HISTORY: Past Medical History  Diagnosis Date  . Complication of anesthesia   . PONV (postoperative nausea and vomiting)   . Hypertension   . GERD (gastroesophageal reflux disease)   . Arthritis   . Anemia   . Heart murmur     evaluated 24yr ago-no cardiac follow up needed    PAST SURGICAL HISTORY: Past Surgical History  Procedure Laterality Date  . Back surgery  1961    spinal fusion age   . Vaginal hysterectomy    . Tonsillectomy       age 42  . Foot arthrodesis  5/12    hammer toes,bunionectomy  . Trigger finger release  02/23/2011    Procedure: RELEASE TRIGGER FINGER/A-1 PULLEY;  Surgeon: Wynonia Sours, MD;  Location: [place];  Service: Orthopedics;  Laterality: Right;  . Ear cyst excision  02/23/2011  . Total hip revision  01/05/2012    Procedure: TOTAL HIP REVISION;  Surgeon: Gearlean Alf, MD;  Location: [facility];  Service: Orthopedics;  Laterality: Left;  . Joint replacement  2006    rt hip  . Joint replacement  2008    lt hip  . Trigger finger release Left 07/26/2012    Procedure: RELEASE TRIGGER FINGER/A-1 PULLEY LEFT SMALL FINGER;  Surgeon: Wynonia Sours, MD;  Location: [place];  Service: Orthopedics;  Laterality: Left;    FAMILY HISTORY: Family History  Problem Relation Age of Onset  . Colon cancer Neg Hx   . Esophageal cancer Neg Hx   . Rectal cancer Neg Hx   . Stomach cancer Neg Hx     SOCIAL HISTORY: History   Social History  . Marital Status: Married    Spouse Name: Nancy Baxter     Number of Children: 5  . Years of Education: College   Occupational History  .      retired   Social History Main Topics  . Smoking status: Never Smoker   . Smokeless tobacco: Never Used  . Alcohol Use: 0.0 oz/week     Comment: occasional  . Drug Use: No  . Sexual Activity: Not on file   Other Topics Concern  . Not on file   Social History Narrative   Patient is retired and married. Nancy Baxter)   Patient has some college education.    Patient has 5 children.    Right handed   Caffeine two cups of caffeine daily ( coffee)     PHYSICAL EXAM  Filed Vitals:   03/12/14 1418  BP: 125/76  Pulse: 65  Temp: 97.9 F (36.6 C)  TempSrc: Oral  Height: 5' (1.524 m)  Weight: 153 lb (69.4 kg)   Body mass index is 29.88 kg/(m^2). Generalized: Well developed, in no acute distress   Neurological examination   Mentation: Alert oriented to time, place, history taking.  Follows all commands speech and language fluent  Cranial nerve II-XII: Pupils were equal round reactive to light extraocular movements were full, visual field were full on confrontational test. Facial sensation and strength were normal. hearing was intact to finger rubbing bilaterally. Uvula tongue midline. head turning and shoulder shrug were normal and symmetric.Tongue protrusion into cheek strength was normal. Motor:Normal bulk and tone, full strength in the BUE, BLE,  fine finger movements normal, no pronator drift. No focal weakness in the upper extremities.  Sensory:normal and symmetric to light touch, pinprick, and vibration  Coordination:finger-nose-finger, heel-to-shin bilaterally, no dysmetria Reflexes: 1+ upper lower and symmetric Gait and Station:Rising up from seated position without assistance, normal stance. Able to heel toe and tandem walk without difficulty     DIAGNOSTIC DATA (LABS, IMAGING, TESTING) - I reviewed patient records, labs, notes, testing and imaging myself where available.  Lab Results  Component Value Date   WBC 14.3* 01/07/2012   HGB 14.1 07/26/2012   HCT 28.1* 01/07/2012   MCV 80.3 01/07/2012   PLT 172 01/07/2012      Component Value Date/Time   NA 135 07/24/2012 1300   K 4.8 07/24/2012 1300   CL 99 07/24/2012 1300   CO2 27 07/24/2012 1300   GLUCOSE 87 07/24/2012 1300   BUN 30* 07/24/2012 1300   CREATININE 1.44* 07/24/2012 1300   CALCIUM 9.5 07/24/2012 1300   PROT 6.9 01/05/2012 0550   ALBUMIN 3.6 01/05/2012 0550   AST 20 01/05/2012 0550   ALT 11 01/05/2012 0550   ALKPHOS 111 01/05/2012 0550   BILITOT 0.4 01/05/2012 0550   GFRNONAA 35* 07/24/2012 1300   GFRAA 41* 07/24/2012 1300    ASSESSMENT AND PLAN  73 y.o. year old female  has a past medical history of restless legs. She is on name brand Requip XL which requires a prior authorization.She did not get Neupro filled.    Continue Requip XL 2 mg (2 tabs) at bedtime Requip 0.25 at 3  PM Follow-up in 6 months Dennie Bible, [facility], [facility], APRN  [facility] [address], [place], [place] [address] [phone]

## 2014-03-12 NOTE — Patient Instructions (Signed)
Continue Requip XL 2 mg (2 tabs) at bedtime Requip 0.25 at 3 PM Follow-up in 6 months

## 2014-03-12 NOTE — Telephone Encounter (Signed)
I called and spoke with the patient about rescheduling her 2:00 appointment with Hoyle Sauer today because she was scheduled for 3 mos. instead of 6 mos..Patient stated that Dr. Brett Fairy wanted to see her back in 3 mos.because she was given samples of Nupro patch. Patient stated she stop using the patches due to sleepiness and she has gone back to taking Requip.    I spoke with Dr. Brett Fairy and per Dr. Brett Fairy to place the patient back on Carolyn's schedule.

## 2014-03-12 NOTE — Progress Notes (Signed)
I agree with the assessment and plan as directed by NP .The patient is known to me .   Carver Murakami, MD  

## 2014-04-18 ENCOUNTER — Other Ambulatory Visit: Payer: Self-pay | Admitting: Specialist

## 2014-04-18 DIAGNOSIS — M545 Low back pain: Secondary | ICD-10-CM

## 2014-04-24 ENCOUNTER — Ambulatory Visit
Admission: RE | Admit: 2014-04-24 | Discharge: 2014-04-24 | Disposition: A | Discharge status: Home / Self Care | Payer: Medicare Other | Source: Ambulatory Visit | Attending: Specialist | Admitting: Specialist

## 2014-04-24 DIAGNOSIS — M545 Low back pain: Secondary | ICD-10-CM

## 2014-07-10 ENCOUNTER — Telehealth: Payer: Self-pay | Admitting: Neurology

## 2014-07-10 NOTE — Telephone Encounter (Signed)
Tiffany with [facility] @ [phone], Rx rOPINIRole (REQUIP) 0.25 MG tablet has been approved for 1 year with effective date of 07/10/14.  FYI

## 2014-08-28 ENCOUNTER — Ambulatory Visit: Payer: Medicare Other | Attending: Specialist

## 2014-08-28 DIAGNOSIS — M545 Low back pain, unspecified: Secondary | ICD-10-CM

## 2014-08-28 DIAGNOSIS — R293 Abnormal posture: Secondary | ICD-10-CM | POA: Diagnosis not present

## 2014-08-28 DIAGNOSIS — M533 Sacrococcygeal disorders, not elsewhere classified: Secondary | ICD-10-CM

## 2014-08-28 NOTE — Patient Instructions (Addendum)
Knee to Chest   Lying supine, bend involved knee to chest.  Hold 20 seconds, do 3 each leg. Repeat with other leg. 3x/day   Copyright  VHI. All rights reserved.  IONTOPHORESIS PATIENT PRECAUTIONS & CONTRAINDICATIONS:  . Redness under one or both electrodes can occur.  This characterized by a uniform redness that usually disappears within 12 hours of treatment. . Small pinhead size blisters may result in response to the drug.  Contact your physician if the problem persists more than 24 hours. . On rare occasions, iontophoresis therapy can result in temporary skin reactions such as rash, inflammation, irritation or burns.  The skin reactions may be the result of individual sensitivity to the ionic solution used, the condition of the skin at the start of treatment, reaction to the materials in the electrodes, allergies or sensitivity to dexamethasone, or a poor connection between the patch and your skin.  Discontinue using iontophoresis if you have any of these reactions and report to your therapist. . Remove the Patch or electrodes if you have any undue sensation of pain or burning during the treatment and report discomfort to your therapist. . Tell your Therapist if you have had known adverse reactions to the application of electrical current. . If using the Patch, the LED light will turn off when treatment is complete and the patch can be removed.  Approximate treatment time is 1-3 hours.  Remove the patch when light goes off or after 6 hours. . The Patch can be worn during normal activity, however excessive motion where the electrodes have been placed can cause poor contact between the skin and the electrode or uneven electrical current resulting in greater risk of skin irritation. Marland Kitchen Keep out of the reach of children.   . DO NOT use if you have a cardiac pacemaker or any other electrically sensitive implanted device. . DO NOT use if you have a known sensitivity to dexamethasone. . DO NOT  use during Magnetic Resonance Imaging (MRI). . DO NOT use over broken or compromised skin (e.g. sunburn, cuts, or acne) due to the increased risk of skin reaction. . DO NOT SHAVE over the area to be treated:  To establish good contact between the Patch and the skin, excessive hair may be clipped. . DO NOT place the Patch or electrodes on or over your eyes, directly over your heart, or brain. . DO NOT reuse the Patch or electrodes as this may cause burns to occur.

## 2014-08-28 NOTE — Therapy (Signed)
[facility] [address], [place], [place], [address] Phone: [phone]   Fax:  [phone]  Physical Therapy Evaluation  Patient Details  Name: Nancy Baxter MRN: [id number] Date of Birth: 1941-02-15 Referring Provider:  Jessy Oto, MD  Encounter Date: 08/28/2014      PT End of Session - 08/28/14 1318    Visit Number 1   Number of Visits 10  Medicare   Date for PT Re-Evaluation 10/23/14   PT Start Time 8850   PT Stop Time 1330   PT Time Calculation (min) 55 min   Activity Tolerance Patient tolerated treatment well   Behavior During Therapy [facility] for tasks assessed/performed      Past Medical History  Diagnosis Date  . Complication of anesthesia   . PONV (postoperative nausea and vomiting)   . Hypertension   . GERD (gastroesophageal reflux disease)   . Arthritis   . Anemia   . Heart murmur     evaluated 75yr ago-no cardiac follow up needed    Past Surgical History  Procedure Laterality Date  . Back surgery  1961    spinal fusion age 74   . Vaginal hysterectomy    . Tonsillectomy      age 74  . Foot arthrodesis  5/12    hammer toes,bunionectomy  . Trigger finger release  02/23/2011    Procedure: RELEASE TRIGGER FINGER/A-1 PULLEY;  Surgeon: Wynonia Sours, MD;  Location: [place];  Service: Orthopedics;  Laterality: Right;  . Ear cyst excision  02/23/2011  . Total hip revision  01/05/2012    Procedure: TOTAL HIP REVISION;  Surgeon: Gearlean Alf, MD;  Location: [facility];  Service: Orthopedics;  Laterality: Left;  . Joint replacement  2006    rt hip  . Joint replacement  2008    lt hip  . Trigger finger release Left 07/26/2012    Procedure: RELEASE TRIGGER FINGER/A-1 PULLEY LEFT SMALL FINGER;  Surgeon: Wynonia Sours, MD;  Location: [place];  Service: Orthopedics;  Laterality: Left;    There were no vitals filed for this visit.  Visit Diagnosis:  Bilateral low  back pain without sciatica - Plan: PT plan of care cert/re-cert  Sacroiliac dysfunction - Plan: PT plan of care cert/re-cert  Posture abnormality - Plan: PT plan of care cert/re-cert      Subjective Assessment - 08/28/14 1240    Subjective Pt reports to PT with complaints of bilateral SI joint pain that began 12/21/13 after she had a fall.  Pt reports that pain progressed until 04/2014 when she saw the MD.   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 per pt report in lower lumbar spine (6 weeks ago), scoliosis per pt report   Limitations Standing;Walking   How long can you sit comfortably? pain after siiting too long when she comes to standing   How long can you stand comfortably? 5 minutes   How long can you walk comfortably? 5 minutes   Diagnostic tests CT scan- see imaging   Currently in Pain? Yes   Pain Score 9    Pain Location Back   Pain Orientation Lower;Left;Right   Pain Descriptors / Indicators Sharp   Pain Type Chronic pain   Pain Onset More than a month ago   Pain Frequency Constant   Aggravating Factors  standing and walking, after sitting long periods   Pain Relieving Factors heat,  volarin gel   Effect of Pain on Daily Activities not able to stand/walk in the community to perform the tasks that she wants to.     Multiple Pain Sites No            [facility] PT Assessment - 08/28/14 0001    Assessment   Medical Diagnosis bilateral sacroilitis   Onset Date 12/21/13   Next MD Visit 6 weeks   Precautions   Precautions Posterior Hip  old bilateral hip replacements   Restrictions   Weight Bearing Restrictions No   Balance Screen   Has the patient fallen in the past 6 months No   Has the patient had a decrease in activity level because of a fear of falling?  No   Is the patient reluctant to leave their home because of a fear of falling?  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  Pt reports pain with all lumbar AROM.  Lt hip flexibility is limited by 25% into ER and flexion (SLR) vs the Rt.   PROM   Overall PROM Comments see above   Strength   Overall Strength Within functional limits for tasks performed   Overall Strength Comments 4+/5 to 5/5 bilateral LE strength   Palpation   Palpation Pt with tension noted in Rt deep gluteals.  Pain with palpation over bilateral SI joints.                     [place] Adult PT Treatment/Exercise - 08/28/14 0001    Bed Mobility   Bed Mobility Rolling Right;Rolling Left;Supine to Sit   Rolling Right 6: Modified independent (Device/Increase time)   Transfers   Transfers Sit to Stand   Sit to Stand 6: Modified independent (Device/Increase time);With upper extremity assist   Ambulation/Gait   Ambulation/Gait Yes   Ambulation/Gait Assistance 6: Modified independent (Device/Increase time)   Ambulation Distance (Feet) 75 Feet   Gait Pattern Trunk flexed   Posture/Postural Control   Posture Comments Pt with forward trunk flexion in sitting and standing due to pain   Modalities   Modalities Iontophoresis   Iontophoresis   Type of Iontophoresis Dexamethasone   Location bilateral SI joints   Dose 1.0 each    Time 6 hour wear time                PT Education - 08/28/14 1308    Education provided Yes   Education Details HEP: single knee to chest- gentle, ionto   Person(s) Educated Patient   Methods Explanation;Demonstration;Handout   Comprehension Verbalized understanding;Returned demonstration          PT Short Term Goals - 08/28/14 1323  PT SHORT TERM GOAL #1   Title be independent in initial HEP   Time 4   Period Weeks   Status New   PT SHORT TERM GOAL #2   Title stand with upright posture for ADLs and self-care   Time 4   Period Weeks   Status New   PT SHORT TERM GOAL #3   Title tolerate standing and walking for 10-15 minutes to improve community activity   Time 4   Period Weeks   Status New           PT Long Term Goals - 2014/08/30 1324    PT LONG TERM GOAL #1   Title be independent in advanced HEP   Time 8   Period Weeks   Status New   PT LONG TERM GOAL #2   Title reduce FOTO to < or = to 48% limitation   Time 8   Period Weeks   Status New   PT LONG TERM GOAL #3   Title stand and walk for 30 minutes without limitaiton to allow for community activity   Time 8   Period Weeks   Status New   PT LONG TERM GOAL #4   Title report a 50% reduction lumbar/SI joint pain with ADLs and volunteer tasks   Time 8   Period Weeks   Status New               Plan - 08/30/2014 1318    Clinical Impression Statement Pt reports to PT with > 6 month history of bilateral lumbar and SI joint pain s/p fall 12/21/13.  Pt demonstrates guarded posture, limited standing tolerance, and hip stiffness (Lt>Rt).  Pt will benefit from postural education, flexibility, strength, and modalities/manual for pain management.     Pt will benefit from skilled therapeutic intervention in order to improve on the following deficits Abnormal gait;Decreased range of motion;Difficulty walking;Impaired flexibility;Postural dysfunction;Pain   PT Frequency 2x / week   PT Duration 8 weeks   PT  Treatment/Interventions ADLs/Self Care Home Management;Therapeutic activities;Patient/family education;Passive range of motion;Therapeutic exercise;Ultrasound;Manual techniques;Neuromuscular re-education;Cryotherapy;Electrical Stimulation;Functional mobility training  ionto: MD ordered on referral   PT Next Visit Plan continue ionto to bil SI joints, gentle hip and lumbar flexibility, manual and modalties   Consulted and Agree with Plan of Care Patient          G-Codes - 30-Aug-2014 07-13-1230    Functional Assessment Tool Used FOTO: 67% limitation   Functional Limitation Other PT primary   Other PT Primary Current Status (M0102) At least 60 percent but less than 80 percent impaired, limited or restricted   Other PT Primary Goal Status (V2536) At least 40 percent but less than 60 percent impaired, limited or restricted       Problem List Patient Active Problem List   Diagnosis Date Noted  . Restless legs syndrome (RLS) 12/25/2012  . Lumbosacral root lesions, not elsewhere classified 12/25/2012  . Postop Transfusion 01/07/2012  . Postop Acute blood loss anemia 01/06/2012  . Postop Hyponatremia 01/06/2012  . Failed total hip arthroplasty 01/05/2012    TAKACS,KELLY, PT 08-30-14, 1:35 PM  [facility] [address], [place], [place], [address] Phone: [phone]   Fax:  [phone]

## 2014-09-04 ENCOUNTER — Ambulatory Visit: Payer: Medicare Other

## 2014-09-04 ENCOUNTER — Ambulatory Visit: Payer: Medicare Other | Admitting: Physical Therapy

## 2014-09-04 ENCOUNTER — Encounter: Payer: Self-pay | Admitting: Physical Therapy

## 2014-09-04 DIAGNOSIS — R293 Abnormal posture: Secondary | ICD-10-CM

## 2014-09-04 DIAGNOSIS — M545 Low back pain, unspecified: Secondary | ICD-10-CM

## 2014-09-04 DIAGNOSIS — M533 Sacrococcygeal disorders, not elsewhere classified: Secondary | ICD-10-CM

## 2014-09-04 NOTE — Therapy (Signed)
[facility] [address] [place], [place], [address] Phone: [phone]   Fax:  [phone]  Physical Therapy Treatment  Patient Details  Name: Nancy Baxter MRN: [id number] Date of Birth: [birth year] Referring Provider:  Shon Baton, MD  Encounter Date: 09/04/2014      PT End of Session - 09/04/14 1317    Visit Number 2   Number of Visits 10   Date for PT Re-Evaluation 10/23/14   PT Start Time 0933   PT Stop Time 1015   PT Time Calculation (min) 42 min   Activity Tolerance Patient tolerated treatment well   Behavior During Therapy [facility] for tasks assessed/performed      Past Medical History  Diagnosis Date  . Complication of anesthesia   . PONV (postoperative nausea and vomiting)   . Hypertension   . GERD (gastroesophageal reflux disease)   . Arthritis   . Anemia   . Heart murmur     evaluated 78yr ago-no cardiac follow up needed    Past Surgical History  Procedure Laterality Date  . Back surgery  1961    spinal fusion age   . Vaginal hysterectomy    . Tonsillectomy      age 69  . Foot arthrodesis  5/12    hammer toes,bunionectomy  . Trigger finger release  02/23/2011    Procedure: RELEASE TRIGGER FINGER/A-1 PULLEY;  Surgeon: Wynonia Sours, MD;  Location: [place];  Service: Orthopedics;  Laterality: Right;  . Ear cyst excision  02/23/2011  . Total hip revision  01/05/2012    Procedure: TOTAL HIP REVISION;  Surgeon: Gearlean Alf, MD;  [facility];  Service: Orthopedics;  Laterality: Left;  . Joint replacement  2006    rt hip  . Joint replacement  2008    lt hip  . Trigger finger release Left 07/26/2012    Procedure: RELEASE TRIGGER FINGER/A-1 PULLEY LEFT SMALL FINGER;  Surgeon: Wynonia Sours, MD;  Location: [place];  Service: Orthopedics;  Laterality: Left;    There were no vitals filed for this visit.  Visit Diagnosis:  Bilateral low back pain without  sciatica  Sacroiliac dysfunction  Posture abnormality      Subjective Assessment - 09/04/14 0943    Subjective Pt reports felt better after last PT session, the exercises help. 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 per pt report in lower lumbar spine (6 weeks ago), scoliosis per pt report   Limitations Standing;Walking   Currently in Pain? Yes   Pain Score 9   no pain with resting, but with transitions sit to stand up to 9/10   Pain Location Back   Pain Orientation Right;Left;Mid   Pain Descriptors / Indicators Sharp  with transfers or with movement with rotational component   Pain Type Chronic pain   Pain Onset More than a month ago   Pain Frequency Constant   Multiple Pain Sites No                         [facility] Adult PT Treatment/Exercise - 09/04/14 0001    Exercises   Exercises Lumbar   Lumbar Exercises: Stretches   Single Knee to Chest Stretch 20 seconds;4 reps  x 2 set's   Pelvic Tilt Other (comment)  ant/post tilt no  holds 1 min x 2   Lumbar Exercises: Supine   Other Supine Lumbar Exercises Practiced logrolling and transfer prone to standing, focus on bracing with activities   Modalities   Modalities Iontophoresis   Iontophoresis   Type of Iontophoresis Dexamethasone   Location bilateral SI joints   Dose 1.0 each   Time 6 hour wear time   Manual Therapy   Manual Therapy Soft tissue mobilization   Soft tissue mobilization Iliopsoas TP bil, STW along Iliac crest bil  after STW SKC improved                PT Education - 09/04/14 1315    Education provided Yes   Education Details Ionto    Person(s) Educated Patient   Methods Explanation   Comprehension Verbalized understanding          PT Short Term Goals - 09/04/14 1320    PT SHORT TERM GOAL #1   Title be independent in initial HEP   Time 4   Period Weeks    Status On-going   PT SHORT TERM GOAL #2   Title stand with upright posture for ADLs and self-care   Time 4   Period Weeks   Status On-going   PT SHORT TERM GOAL #3   Title tolerate standing and walking for 10-15 minutes to improve community activity   Time 4   Period Weeks   Status On-going           PT Long Term Goals - 09/04/14 1320    PT LONG TERM GOAL #1   Title be independent in advanced HEP   Time 8   Period Weeks   Status On-going   PT LONG TERM GOAL #2   Title reduce FOTO to < or = to 48% limitation   Time 8   Period Weeks   Status On-going   PT LONG TERM GOAL #3   Title stand and walk for 30 minutes without limitaiton to allow for community activity   Time 8   Period Weeks   Status On-going   PT LONG TERM GOAL #4   Title report a 50% reduction lumbar/SI joint pain with ADLs and volunteer tasks   Time 8   Period Weeks   Status On-going               Plan - 09/04/14 1318    Clinical Impression Statement Pt presents to PT after 60month of bil. lumbar and SI joint pain s/p fall 12/21/13. Pt with guarding posture and limited activity tolerance esspecially standing (Lt.Rt)   Pt will benefit from skilled therapeutic intervention in order to improve on the following deficits Abnormal gait;Decreased range of motion;Difficulty walking;Impaired flexibility;Postural dysfunction;Pain   Rehab Potential Good   PT Frequency 2x / week   PT Duration 8 weeks   PT Next Visit Plan continue ionto to bil SI joints, gentle hip and lumbar flexibility, manual and modalties   Consulted and Agree with Plan of Care Patient        Problem List Patient Active Problem List   Diagnosis Date Noted  . Restless legs syndrome (RLS) 12/25/2012  . Lumbosacral root lesions, not elsewhere classified 12/25/2012  . Postop Transfusion 01/07/2012  . Postop Acute blood loss anemia 01/06/2012  . Postop Hyponatremia 01/06/2012  . Failed total hip arthroplasty 01/05/2012     NAUMANN-HOUEGNIFIO,Dekari Bures PTA 09/04/2014, 1:30 PM  [facility] [address], [place], [place], [address] Phone: [phone]  Fax:  [phone]

## 2014-09-04 NOTE — Patient Instructions (Addendum)

## 2014-09-10 ENCOUNTER — Ambulatory Visit: Payer: Medicare Other | Admitting: Physical Therapy

## 2014-09-12 ENCOUNTER — Encounter: Payer: Self-pay | Admitting: Nurse Practitioner

## 2014-09-12 ENCOUNTER — Ambulatory Visit: Payer: Medicare Other | Attending: Specialist | Admitting: Physical Therapy

## 2014-09-12 ENCOUNTER — Ambulatory Visit (INDEPENDENT_AMBULATORY_CARE_PROVIDER_SITE_OTHER): Payer: Medicare Other | Admitting: Nurse Practitioner

## 2014-09-12 VITALS — BP 127/72 | HR 66 | Ht 60.25 in | Wt 152.0 lb

## 2014-09-12 DIAGNOSIS — M533 Sacrococcygeal disorders, not elsewhere classified: Secondary | ICD-10-CM

## 2014-09-12 DIAGNOSIS — R293 Abnormal posture: Secondary | ICD-10-CM | POA: Diagnosis present

## 2014-09-12 DIAGNOSIS — M545 Low back pain, unspecified: Secondary | ICD-10-CM

## 2014-09-12 DIAGNOSIS — G2581 Restless legs syndrome: Secondary | ICD-10-CM | POA: Diagnosis not present

## 2014-09-12 MED ORDER — ROPINIROLE HCL 0.25 MG PO TABS: ORAL_TABLET | ORAL | Status: DC

## 2014-09-12 NOTE — Therapy (Signed)
[facility] [address], [place], [place], [address] Phone: [phone]   Fax:  [phone]  Physical Therapy Treatment  Patient Details  Name: Nancy Baxter MRN: [id number] Date of Birth: Aug 28, 1940 Referring Provider:  Shon Baton, MD  Encounter Date: 09/12/2014      PT End of Session - 09/12/14 1230    Visit Number 3   Number of Visits 10   Date for PT Re-Evaluation 10/23/14   PT Start Time 1093   PT Stop Time 2355   PT Time Calculation (min) 47 min   Activity Tolerance Patient tolerated treatment well      Past Medical History  Diagnosis Date  . Complication of anesthesia   . PONV (postoperative nausea and vomiting)   . Hypertension   . GERD (gastroesophageal reflux disease)   . Arthritis   . Anemia   . Heart murmur     evaluated 46yr ago-no cardiac follow up needed    Past Surgical History  Procedure Laterality Date  . Back surgery  1961    spinal fusion age   . Vaginal hysterectomy    . Tonsillectomy      age 61  . Foot arthrodesis  5/12    hammer toes,bunionectomy  . Trigger finger release  02/23/2011    Procedure: RELEASE TRIGGER FINGER/A-1 PULLEY;  Surgeon: Wynonia Sours, MD;  Location: [place];  Service: Orthopedics;  Laterality: Right;  . Ear cyst excision  02/23/2011  . Total hip revision  01/05/2012    Procedure: TOTAL HIP REVISION;  Surgeon: Gearlean Alf, MD;  [facility];  Service: Orthopedics;  Laterality: Left;  . Joint replacement  2006    rt hip  . Joint replacement  2008    lt hip  . Trigger finger release Left 07/26/2012    Procedure: RELEASE TRIGGER FINGER/A-1 PULLEY LEFT SMALL FINGER;  Surgeon: Wynonia Sours, MD;  Location: [place];  Service: Orthopedics;  Laterality: Left;    There were no vitals filed for this visit.  Visit Diagnosis:  Bilateral low back pain without sciatica  Sacroiliac dysfunction      Subjective  Assessment - 09/12/14 1231    Subjective Patient used TENS for about an hour this morning and is compliant with HEP. Left side pain is the worse and always worse in the morning.   How long can you stand comfortably? no limitations   How long can you walk comfortably? 2 hours   Currently in Pain? Yes   Pain Score 4   in morning 6/10   Pain Location Back   Pain Orientation Right;Left;Mid   Pain Descriptors / Indicators Sharp   Pain Type Chronic pain   Pain Onset More than a month ago   Aggravating Factors  sitting   Pain Relieving Factors heat, voltarin   Effect of Pain on Daily Activities constant pain                         [facility] Adult PT Treatment/Exercise - 09/12/14 0001    Lumbar Exercises: Supine   Ab Set 5 reps;5 seconds  wth knee in/out x5 bil with breathing   Bridge 10 reps   Other Supine Lumbar Exercises reviewed pelvic rocks and SKTC   Iontophoresis   Type of Iontophoresis Dexamethasone   Location bilateral SI joints   Dose 1.0 each   Time 6 hour wear time   Manual  Therapy   Manual Therapy Soft tissue mobilization;Myofascial release   Manual therapy comments ART to left glut mas and Rt glut med   Myofascial Release TPR to bil piriformis and MRF to right gluts along iliac crest                  PT Short Term Goals - 09/12/14 1324    PT SHORT TERM GOAL #3   Title tolerate standing and walking for 10-15 minutes to improve community activity   Time 4   Period Weeks   Status Achieved           PT Long Term Goals - 09/12/14 1324    PT LONG TERM GOAL #3   Title stand and walk for 30 minutes without limitaiton to allow for community activity   Time 8   Period Weeks   Status Achieved               Plan - 09/12/14 1321    Clinical Impression Statement Patient reports decreased intensity of pain (although still constant) and increased enduance with standing and walking. She continues to have marked tenderness and active TPs in  bil hip muscles and left lumbar paraspinals.   PT Next Visit Plan continue manual prn, ionto, stretch for left lumbar paraspinals, progress TrAb exercises.   Consulted and Agree with Plan of Care Patient        Problem List Patient Active Problem List   Diagnosis Date Noted  . Restless legs syndrome (RLS) 12/25/2012  . Lumbosacral root lesions, not elsewhere classified 12/25/2012  . Postop Transfusion 01/07/2012  . Postop Acute blood loss anemia 01/06/2012  . Postop Hyponatremia 01/06/2012  . Failed total hip arthroplasty 01/05/2012    Madelyn Flavors PT  09/12/2014, 1:28 PM  [facility] [address] [place], [place], [address] Phone: [phone]   Fax:  [phone]

## 2014-09-12 NOTE — Progress Notes (Signed)
I agree with the assessment and plan as directed by NP .The patient is known to me .   Nathanal Hermiz, MD  

## 2014-09-12 NOTE — Patient Instructions (Signed)
Lower abdominal/core stability exercises  1. Practice your breathing technique: Inhale through your nose expanding your belly and rib cage. Try not to breathe into your chest. Exhale slowly and gradually out your mouth feeling a sense of softness to your body. Practice multiple times. This can be performed unlimited.  2. Finding the lower abdominals. Laying on your back with the knees bent, place your fingers just below your belly button. Using your breathing technique from above, on your exhale gently pull the belly button away from your fingertips without tensing any other muscles. Practice this 5x. Next, as you exhale, draw belly button inwards and hold onto it...then feel as if you are pulling that muscle across your pelvis like you are tightening a belt. This can be hard to do at first so be patient and practice. Do 5-10 reps 1-3 x day. Always recognize quality over quantity; if your abdominal muscles become tired you will notice you may tighten/contract other muscles. This is the time to take a break.   Practice this first laying on your back, then in sitting, progressing to standing and finally adding it to all your daily movements.   3. Finding your pelvic floor. Using the breathing technique above, when your exhale, this time draw your pelvic floor muscles up as if you were attempting to stop the flow of urination. Be careful NOT to tense any other muscles. This can be hard, BE PATIENT. Try to hold up to 10 seconds repeating 10x. Try 2x a day. Once you feel you are doing this well, add this contraction to exercise #2. First contracting your pelvic floor followed by lower abdominals.   4. Adding leg movements. Add the following leg movements to challenge your ability to keep your core stable:  1. Single leg drop outs: Laying on your back with knees bent feet flat. Inhale,  dropping one knee outward KEEPING YOUR PELVIS STILL. Exhale as you bring the leg back, simultaneously performing your lower  abdominal contraction. Do 5-10 on each leg.   2. Marching: While keeping your pelvis still, lift the right foot a few inches, put it down then lift left foot. This will mimic a march. Start slow to establish control. Once you have control you may speed it up. Do 10-20x. You MUST keep your lower abdominlas contracted while you march. Breathe naturally    3. Single leg slides: Inhale while you slowly slide one leg out keeping your pelvis still. Only slide your leg as far as you can keep your pelvis still. Exhale as you bring the leg back to the start, contracting the lower abdominals as you do that. Keep your upper body relaxed. Do 5-10 on each side.      Madelyn Flavors, PT 09/12/2014 12:53 PM [facility] [address] [place], [place] [address] Phone # [phone] Fax [phone]

## 2014-09-12 NOTE — Patient Instructions (Signed)
Continue Requip XL 2 mg, 2 tabs at bedtime Requip 0.25 at 3:00 PM Continue exercise program Follow-up in 6 months

## 2014-09-12 NOTE — Progress Notes (Signed)
[facility]  PATIENT: Nancy Baxter DOB: 1940-08-25   REASON FOR VISIT: follow up for restless legs HISTORY FROM:Patient    HISTORY OF PRESENT ILLNESS:Nancy Baxter, 74 year old female returns for follow up. She has a history of restless legs and has failed generic  Requip XL. She is currently on name brand which requires prior AUTHORIZATION. She was last seen in this office 03/12/14. She has failed  Neupro patch, she felt it made her restless legs worse. She noticed that a glass of red wine has also a detrimental affect and also caffeine. Prilosec seemed to decrease the effect, too.  She continues with short acting dose in the afternoon. She has not given up any activities, she remains very active. She walks for exercise . She is currently in physical therapy for low back pain She returns for reevaluation.   HISTORY: decade history of restless legs, and found relief from Requip - responded very well to Requip XL and stated " it gave me my life back"/  she recently observed that the generic Requip may trigger some restless legs in the earlier day time .  has no other problems, she takes an additional dose of her Requip every day, a short-acting dose , currently at 3 PM. 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  Restless legs do not interfere with this . she is taking her extended release Requip around 8:30 at night she does not go to bed until 11 or 12 PM.       REVIEW OF SYSTEMS: Full 14 system review of systems performed and notable only for those listed, all others are neg:  Constitutional: neg  Cardiovascular: neg Ear/Nose/Throat: neg  Skin: neg Eyes: neg Respiratory: neg Gastroitestinal: neg  Hematology/Lymphatic: neg  Endocrine: neg Musculoskeletal: Back pain Allergy/Immunology: neg Neurological: neg Psychiatric: neg Sleep : Restless legs   ALLERGIES: Allergies  Allergen Reactions  . Sulfa  Antibiotics Hives  . Codeine Nausea And Vomiting  . Prednisone Other (See Comments)    Shuts down adrenals  . Zolpidem Tartrate Other (See Comments)    sleepwalks  . Adhesive [Tape] Rash    blisters    HOME MEDICATIONS: Outpatient Prescriptions Prior to Visit  Medication Sig Dispense Refill  . aspirin EC 81 MG tablet Take 81 mg by mouth daily.    . calcium carbonate (OS-CAL) 1250 MG chewable tablet Chew 1 tablet by mouth daily.    . ferrous fumarate (HEMOCYTE - 106 MG FE) 325 (106 FE) MG TABS Take 37 mg of iron by mouth 2 (two) times a week. Pt takes two times weekly - Monday and thursdays    . Fish Oil-Cholecalciferol (FISH OIL + D3 PO) Take by mouth.    . fluorouracil (EFUDEX) 5 % cream as needed.     Marland Kitchen losartan (COZAAR) 50 MG tablet Take 50 mg by mouth every morning.     . metoprolol (TOPROL-XL) 50 MG 24 hr tablet Take 50 mg by mouth every morning.     Marland Kitchen omeprazole (PRILOSEC) 20 MG capsule Take 20 mg by mouth daily.     Marland Kitchen rOPINIRole (REQUIP XL) 2 MG 24 hr tablet Take 2 tablets (4 mg total) by mouth at bedtime. Take 2 at bedtime. BRAND NAME ONLY 180 tablet 3  . rOPINIRole (REQUIP) 0.25 MG tablet Use at 3 Pm po. 90 tablet 1  . triamterene-hydrochlorothiazide (MAXZIDE-25) 37.5-25 MG per tablet Take 0.5 tablets by mouth every morning.    Marland Kitchen  naproxen sodium (ANAPROX) 220 MG tablet Take 220 mg by mouth 2 (two) times daily with a meal.     No facility-administered medications prior to visit.    PAST MEDICAL HISTORY: Past Medical History  Diagnosis Date  . Complication of anesthesia   . PONV (postoperative nausea and vomiting)   . Hypertension   . GERD (gastroesophageal reflux disease)   . Arthritis   . Anemia   . Heart murmur     evaluated 9yr ago-no cardiac follow up needed    PAST SURGICAL HISTORY: Past Surgical History  Procedure Laterality Date  . Back surgery  1961    spinal fusion age   . Vaginal hysterectomy    . Tonsillectomy      age 25  . Foot arthrodesis  5/12     hammer toes,bunionectomy  . Trigger finger release  02/23/2011    Procedure: RELEASE TRIGGER FINGER/A-1 PULLEY;  Surgeon: Wynonia Sours, MD;  Location: [place];  Service: Orthopedics;  Laterality: Right;  . Ear cyst excision  02/23/2011  . Total hip revision  01/05/2012    Procedure: TOTAL HIP REVISION;  Surgeon: Gearlean Alf, MD;  [facility];  Service: Orthopedics;  Laterality: Left;  . Joint replacement  2006    rt hip  . Joint replacement  2008    lt hip  . Trigger finger release Left 07/26/2012    Procedure: RELEASE TRIGGER FINGER/A-1 PULLEY LEFT SMALL FINGER;  Surgeon: Wynonia Sours, MD;  Location: [place];  Service: Orthopedics;  Laterality: Left;    FAMILY HISTORY: Family History  Problem Relation Age of Onset  . Colon cancer Neg Hx   . Esophageal cancer Neg Hx   . Rectal cancer Neg Hx   . Stomach cancer Neg Hx     SOCIAL HISTORY: History   Social History  . Marital Status: Married    Spouse Name: Nancy Baxter   . Number of Children: 5  . Years of Education: College   Occupational History  .      retired   Social History Main Topics  . Smoking status: Never Smoker   . Smokeless tobacco: Never Used  . Alcohol Use: 0.0 oz/week     Comment: occasional  . Drug Use: No  . Sexual Activity: Not on file   Other Topics Concern  . Not on file   Social History Narrative   Patient is retired and married. Nancy Baxter)   Patient has some college education.    Patient has 5 children.    Right handed   Caffeine two cups of caffeine daily ( coffee)     PHYSICAL EXAM  Filed Vitals:   09/12/14 1426  BP: 127/72  Pulse: 66  Height: 5' 0.25" (1.53 m)  Weight: 152 lb (68.947 kg)   Body mass index is 29.45 kg/(m^2).  Generalized: Well developed, in no acute distress   Neurological examination   Mentation: Alert oriented to time, place, history taking. Attention span and concentration appropriate. Recent and remote memory intact.   Follows all commands speech and language fluent.  Cranial nerve II-XII: .Pupils were equal round reactive to light extraocular movements were full, visual field were full on confrontational test. Facial sensation and strength were normal. hearing was intact to finger rubbing bilaterally. Uvula tongue midline. head turning and shoulder shrug were normal and symmetric.Tongue protrusion into cheek strength was normal. Motor: normal bulk and tone, full strength in the BUE, BLE, fine finger movements normal,  no pronator drift. No focal weakness Sensory: normal and symmetric to light touch, pinprick, and  Vibration, proprioception  Coordination: finger-nose-finger, heel-to-shin bilaterally, no dysmetria Reflexes: 1+ upper lower and symmetric plantar responses were flexor bilaterally. Gait and Station: Rising up from seated position without assistance, normal stance,  moderate stride, good arm swing, smooth turning, able to perform tiptoe, and heel walking without difficulty. Tandem gait is steady  DIAGNOSTIC DATA (LABS, IMAGING, TESTING)   ASSESSMENT AND PLAN  74 y.o. year old female  has a past medical history of restless leg syndrome currently well controlled on brand Requip XL which requires a prior authorization. Neupro patch caused her restless legs to worsen.  Continue Requip XL 2 mg, 2 tabs at bedtime does not need refills Requip 0.25 at 3:00 PM, will refill Continue exercise program Follow-up in 6 months Dennie Bible, [facility], [facility], APRN  [facility] [address] [place], [place] [address] [phone] Her other

## 2014-09-18 ENCOUNTER — Ambulatory Visit: Payer: Medicare Other | Admitting: Physical Therapy

## 2014-09-18 DIAGNOSIS — M545 Low back pain, unspecified: Secondary | ICD-10-CM

## 2014-09-18 DIAGNOSIS — R293 Abnormal posture: Secondary | ICD-10-CM

## 2014-09-18 NOTE — Patient Instructions (Addendum)
Side Bend   Stand with feet shoulder width apart, arms at sides. Bend to right side, reaching down toward knee with hand. Return to standing, bend to other side. Repeat sequence _10___ times per session. Do __2_ sessions per  day. Variation: Hands on hips Arms at shoulder level Arms over head  Copyright  VHI. All rights reserved.   Marland KitchenBACK: Child's Pose (Sciatica)   Sit in a chair. Reach arms forward over table until stretch is felt in the back. Separate knees for comfort. Hold position for 30-60 seconds. Repeat _2-3__ times. Do _2__ times per day.  Copyright  VHI. All rights reserved.    Also, mckenzie lateral shift correction and positioning over yoga mat under right thoracic. (hardcopies given from drawer). Nancy Baxter, PT 09/18/2014 8:49 AM [facility] [address], [place], [place] [address] Phone # [phone] Fax [phone]

## 2014-09-18 NOTE — Therapy (Signed)
[facility] [address] [place], [place], [address] Phone: [phone]   Fax:  [phone]  Physical Therapy Treatment  Patient Details  Name: Nancy Baxter MRN: [id number] Date of Birth: Jul 22, 1940 Referring Provider:  Shon Baton, MD  Encounter Date: 09/18/2014      PT End of Session - 09/18/14 0804    Visit Number 4   Number of Visits 10   Date for PT Re-Evaluation 10/23/14   PT Start Time 0804   PT Stop Time 0850   PT Time Calculation (min) 46 min   Activity Tolerance Patient tolerated treatment well      Past Medical History  Diagnosis Date  . Complication of anesthesia   . PONV (postoperative nausea and vomiting)   . Hypertension   . GERD (gastroesophageal reflux disease)   . Arthritis   . Anemia   . Heart murmur     evaluated 65yr ago-no cardiac follow up needed    Past Surgical History  Procedure Laterality Date  . Back surgery  1961    spinal fusion age   . Vaginal hysterectomy    . Tonsillectomy      age 47  . Foot arthrodesis  5/12    hammer toes,bunionectomy  . Trigger finger release  02/23/2011    Procedure: RELEASE TRIGGER FINGER/A-1 PULLEY;  Surgeon: Wynonia Sours, MD;  Location: [place];  Service: Orthopedics;  Laterality: Right;  . Ear cyst excision  02/23/2011  . Total hip revision  01/05/2012    Procedure: TOTAL HIP REVISION;  Surgeon: Gearlean Alf, MD;  Location: [facility];  Service: Orthopedics;  Laterality: Left;  . Joint replacement  2006    rt hip  . Joint replacement  2008    lt hip  . Trigger finger release Left 07/26/2012    Procedure: RELEASE TRIGGER FINGER/A-1 PULLEY LEFT SMALL FINGER;  Surgeon: Wynonia Sours, MD;  Location: [place];  Service: Orthopedics;  Laterality: Left;    There were no vitals filed for this visit.  Visit Diagnosis:  Posture abnormality  Bilateral low back pain without sciatica      Subjective  Assessment - 09/18/14 0805    Subjective Pretty sure the exercises are helping. The pain across the waist is still bad. Its better in the buttocks/hips. In the morning, pt reports she is leaning right due to scoliosis.   Currently in Pain? Yes   Pain Score 4    Pain Location Back   Pain Orientation Right;Left;Lower   Pain Descriptors / Indicators Sharp   Pain Type Chronic pain   Pain Onset More than a month ago   Pain Frequency Intermittent   Aggravating Factors  siit<>stand                         [facility] Adult PT Treatment/Exercise - 09/18/14 0001    Lumbar Exercises: Standing   Other Standing Lumbar Exercises Mckenzie lateral shift exercise x 5 then with hold   Lumbar Exercises: Seated   Other Seated Lumbar Exercises prayer stretch over table then to right side 2x 60 seconds ea.   Other Seated Lumbar Exercises lateral SB to right at thoracic level x 10    Lumbar Exercises: Sidelying   Other Sidelying Lumbar Exercises Positioning over yoga mat under right thoracic; then on left over lumbar  PT Education - 09/18/14 1221    Education provided Yes   Education Details HEP: mckenzie lateral shift, positioning over yoga mat, thoracic SB and child's pose (seated using table); also education regarding strengthening right side and stretching left side of paraspinals to prevent further curvature.   Person(s) Educated Patient   Methods Explanation;Demonstration;Handout   Comprehension Verbalized understanding;Returned demonstration          PT Short Term Goals - 09/18/14 0821    PT SHORT TERM GOAL #1   Title be independent in initial HEP   Time 4   Period Weeks   Status Achieved           PT Long Term Goals - 09/18/14 0240    PT LONG TERM GOAL #4   Title report a 50% reduction lumbar/SI joint pain with ADLs and volunteer tasks  15% lumbar; 90% SI reduction   Time 8   Period Weeks   Status On-going               Plan -  09/18/14 1223    Clinical Impression Statement patient showing much improvement with bil hip/si pain,meeting LTG. Back pain <20% better. Tolerated positioning and exercises to stretch out thoracic curve. Patient will benefit from continued flexibility and stabilization exercises as well as MFR to paraspinals and hip/low back muscles.   PT Next Visit Plan review latest HEP, progress TrAb exercises, try manual to paraspinals   Consulted and Agree with Plan of Care Patient        Problem List Patient Active Problem List   Diagnosis Date Noted  . Restless legs syndrome (RLS) 12/25/2012  . Lumbosacral root lesions, not elsewhere classified 12/25/2012  . Postop Transfusion 01/07/2012  . Postop Acute blood loss anemia 01/06/2012  . Postop Hyponatremia 01/06/2012  . Failed total hip arthroplasty 01/05/2012    Madelyn Flavors PT  09/18/2014, 12:29 PM  [facility] [address], [place], [place], [address] Phone: [phone]   Fax:  [phone]

## 2014-09-25 ENCOUNTER — Ambulatory Visit: Payer: Medicare Other

## 2014-09-25 DIAGNOSIS — R293 Abnormal posture: Secondary | ICD-10-CM

## 2014-09-25 DIAGNOSIS — M533 Sacrococcygeal disorders, not elsewhere classified: Secondary | ICD-10-CM

## 2014-09-25 DIAGNOSIS — M545 Low back pain, unspecified: Secondary | ICD-10-CM

## 2014-09-25 NOTE — Therapy (Signed)
[facility] [address], [place], [place], [address] Phone: [phone]   Fax:  [phone]  Physical Therapy Treatment  Patient Details  Name: Nancy Baxter MRN: [id number] Date of Birth: 09-21-1940 Referring Provider:  Shon Baton, MD  Encounter Date: 09/25/2014      PT End of Session - 09/25/14 1529    Visit Number 5   Number of Visits 10   Date for PT Re-Evaluation 10/23/14   PT Start Time 4854   PT Stop Time 1529   PT Time Calculation (min) 45 min   Activity Tolerance Patient tolerated treatment well   Behavior During Therapy [facility] for tasks assessed/performed      Past Medical History  Diagnosis Date  . Complication of anesthesia   . PONV (postoperative nausea and vomiting)   . Hypertension   . GERD (gastroesophageal reflux disease)   . Arthritis   . Anemia   . Heart murmur     evaluated 75yr ago-no cardiac follow up needed    Past Surgical History  Procedure Laterality Date  . Back surgery  1961    spinal fusion age   . Vaginal hysterectomy    . Tonsillectomy      age 37  . Foot arthrodesis  5/12    hammer toes,bunionectomy  . Trigger finger release  02/23/2011    Procedure: RELEASE TRIGGER FINGER/A-1 PULLEY;  Surgeon: Wynonia Sours, MD;  Location: [place];  Service: Orthopedics;  Laterality: Right;  . Ear cyst excision  02/23/2011  . Total hip revision  01/05/2012    Procedure: TOTAL HIP REVISION;  Surgeon: Gearlean Alf, MD;  Location: [facility];  Service: Orthopedics;  Laterality: Left;  . Joint replacement  2006    rt hip  . Joint replacement  2008    lt hip  . Trigger finger release Left 07/26/2012    Procedure: RELEASE TRIGGER FINGER/A-1 PULLEY LEFT SMALL FINGER;  Surgeon: Wynonia Sours, MD;  Location: [place];  Service: Orthopedics;  Laterality: Left;    There were no vitals filed for this visit.  Visit Diagnosis:  Bilateral low back pain without  sciatica  Posture abnormality  Sacroiliac dysfunction      Subjective Assessment - 09/25/14 1441    Subjective Pt reports that she had a bad day yesterday.  Unsure of the reason.     Currently in Pain? Yes   Pain Score 2   10/10 yesterday, 6/10 earlier today   Pain Location Back   Pain Orientation Right;Left;Lower   Pain Descriptors / Indicators Sharp   Pain Type Chronic pain   Pain Onset More than a month ago   Pain Frequency Constant   Aggravating Factors  laying down, early in the morning, sitting on sofa   Pain Relieving Factors Aleve, heat, icy hot patches, TENs unit   Multiple Pain Sites No                         [facility] Adult PT Treatment/Exercise - 09/25/14 0001    Lumbar Exercises: Stretches   Active Hamstring Stretch 3 reps;20 seconds  seated at edge of mat   Single Knee to Chest Stretch 20 seconds;4 reps  x 2 set's   Piriformis Stretch 3 reps;20 seconds  seated   Lumbar Exercises: Aerobic   Stationary Bike Level 2x 8 minutes  PT present to discuss progress   Lumbar Exercises: Supine  Other Supine Lumbar Exercises supine on moist heat: deompression exercises: decompression, scapula press, leg lengthener, leg press x10 each   Modalities   Modalities Moist Heat   Moist Heat Therapy   Number Minutes Moist Heat 25 Minutes  during exercise   Moist Heat Location Lumbar Spine                  PT Short Term Goals - 09/25/14 1449    PT SHORT TERM GOAL #1   Title be independent in initial HEP   Status Achieved   PT SHORT TERM GOAL #2   Title stand with upright posture for ADLs and self-care   Status On-going  some days not able to stand upright   PT SHORT TERM GOAL #3   Title tolerate standing and walking for 10-15 minutes to improve community activity   Status Achieved           PT Long Term Goals - 09/25/14 1450    PT LONG TERM GOAL #4   Title report a 50% reduction lumbar/SI joint pain with ADLs and volunteer tasks   Time  8   Period Weeks  Pt denies any significant change in symptoms   Status On-going               Plan - 09/25/14 1452    Clinical Impression Statement Pt with continued pain that limits upright posture and ability to perform ADLs and community function. Pt with only 1 flare-up since the start of care.  Pt with improve mobility with change of position today.  Pt will benefit from PT for postural education,core stregnth, flexibility and manual/modalities PRN.    Pt will benefit from skilled therapeutic intervention in order to improve on the following deficits Abnormal gait;Decreased range of motion;Difficulty walking;Impaired flexibility;Postural dysfunction;Pain   Rehab Potential Good   PT Frequency 2x / week   PT Duration 8 weeks   PT Treatment/Interventions ADLs/Self Care Home Management;Therapeutic activities;Patient/family education;Passive range of motion;Therapeutic exercise;Ultrasound;Manual techniques;Neuromuscular re-education;Cryotherapy;Electrical Stimulation;Functional mobility training   PT Next Visit Plan Gentle flexibility, decompression, heat/modalties as needed.  Add decompression exercises to HEP if tolerated well   Consulted and Agree with Plan of Care Patient        Problem List Patient Active Problem List   Diagnosis Date Noted  . Restless legs syndrome (RLS) 12/25/2012  . Lumbosacral root lesions, not elsewhere classified 12/25/2012  . Postop Transfusion 01/07/2012  . Postop Acute blood loss anemia 01/06/2012  . Postop Hyponatremia 01/06/2012  . Failed total hip arthroplasty 01/05/2012    Eulalie Speights, PT 09/25/2014, 3:34 PM  [facility] [address] [place], [place], [address] Phone: [phone]   Fax:  [phone]

## 2014-10-01 ENCOUNTER — Ambulatory Visit: Payer: Medicare Other

## 2014-10-01 DIAGNOSIS — M545 Low back pain, unspecified: Secondary | ICD-10-CM

## 2014-10-01 DIAGNOSIS — M533 Sacrococcygeal disorders, not elsewhere classified: Secondary | ICD-10-CM

## 2014-10-01 DIAGNOSIS — R293 Abnormal posture: Secondary | ICD-10-CM

## 2014-10-01 NOTE — Therapy (Signed)
[facility] [address], [place], [place], [address] Phone: [phone]   Fax:  [phone]  Physical Therapy Treatment  Patient Details  Name: Nancy Baxter MRN: [id number] Date of Birth: 07-11-1940 Referring Provider:  Shon Baton, MD  Encounter Date: 10/01/2014      PT End of Session - 10/01/14 1016    Visit Number 6   Number of Visits 10  Medicare   Date for PT Re-Evaluation 10/23/14   PT Start Time 0932   PT Stop Time 2774   PT Time Calculation (min) 42 min   Activity Tolerance Patient tolerated treatment well   Behavior During Therapy [facility] for tasks assessed/performed      Past Medical History  Diagnosis Date  . Complication of anesthesia   . PONV (postoperative nausea and vomiting)   . Hypertension   . GERD (gastroesophageal reflux disease)   . Arthritis   . Anemia   . Heart murmur     evaluated 81yr ago-no cardiac follow up needed    Past Surgical History  Procedure Laterality Date  . Back surgery  1961    spinal fusion age   . Vaginal hysterectomy    . Tonsillectomy      age 53  . Foot arthrodesis  5/12    hammer toes,bunionectomy  . Trigger finger release  02/23/2011    Procedure: RELEASE TRIGGER FINGER/A-1 PULLEY;  Surgeon: Wynonia Sours, MD;  Location: [place];  Service: Orthopedics;  Laterality: Right;  . Ear cyst excision  02/23/2011  . Total hip revision  01/05/2012    Procedure: TOTAL HIP REVISION;  Surgeon: Gearlean Alf, MD;  Location: [facility];  Service: Orthopedics;  Laterality: Left;  . Joint replacement  2006    rt hip  . Joint replacement  2008    lt hip  . Trigger finger release Left 07/26/2012    Procedure: RELEASE TRIGGER FINGER/A-1 PULLEY LEFT SMALL FINGER;  Surgeon: Wynonia Sours, MD;  Location: [place];  Service: Orthopedics;  Laterality: Left;    There were no vitals filed for this visit.  Visit Diagnosis:  Bilateral low back  pain without sciatica  Posture abnormality  Sacroiliac dysfunction      Subjective Assessment - 10/01/14 0937    Subjective Felt good after new exercises last session.  Increased ease with getting in and out of bed.  No pain in Lt gluteals.     Currently in Pain? Yes   Pain Score 4    Pain Location Back   Pain Orientation Right;Left;Lower   Pain Descriptors / Indicators Aching;Sharp;Sore   Pain Type Chronic pain   Pain Onset More than a month ago   Pain Frequency Constant   Aggravating Factors  laying down, early in the morning, sitting on the sofa   Pain Relieving Factors Aleve, heat, icy hot patches, TENs unit   Multiple Pain Sites No                         [facility] Adult PT Treatment/Exercise - 10/01/14 0001    Lumbar Exercises: Stretches   Active Hamstring Stretch 3 reps;20 seconds  seated at edge of mat   Single Knee to Chest Stretch 20 seconds;4 reps  x 2 set's   Lumbar Exercises: Aerobic   Stationary Bike Level 2x 8 minutes  PT present to discuss progress   Lumbar Exercises: Supine   Other Supine Lumbar  Exercises supine on moist heat: deompression exercises: decompression, scapula press, leg lengthener, leg press x10 each   Moist Heat Therapy   Number Minutes Moist Heat 25 Minutes  during exercise   Moist Heat Location Lumbar Spine                PT Education - 10/01/14 1003    Education provided Yes   Education Details HEP: Meek's decompression.  Body mechanics education for gardening/ADLs   Person(s) Educated Patient   Methods Explanation;Demonstration;Handout   Comprehension Verbalized understanding;Returned demonstration          PT Short Term Goals - 10/01/14 0953    PT SHORT TERM GOAL #2   Title stand with upright posture for ADLs and self-care   Time 4   Period Weeks   Status On-going  pt reports increased ease-hardes in the morning           PT Long Term Goals - 10/01/14 0956    PT LONG TERM GOAL #3   Title stand  and walk for 30 minutes without limitaiton to allow for community activity   Status --  good day: unlimited. Bad day: 30 minutes   PT LONG TERM GOAL #4   Title report a 50% reduction lumbar/SI joint pain with ADLs and volunteer tasks   Time 8   Period Weeks   Status On-going               Plan - 10/01/14 0957    Clinical Impression Statement Pt with improved mobility and abilty to change position with increased ease.  See goals for status.  Pt with continued LBP with standing and gardening.  Pt will benefit from PT to improve core strength, mechaincs with gardening/ADLs and flexibility.   Pt will benefit from skilled therapeutic intervention in order to improve on the following deficits Abnormal gait;Decreased range of motion;Difficulty walking;Impaired flexibility;Postural dysfunction;Pain   Rehab Potential Good   PT Frequency 2x / week   PT Duration 8 weeks   PT Treatment/Interventions ADLs/Self Care Home Management;Therapeutic activities;Patient/family education;Passive range of motion;Therapeutic exercise;Ultrasound;Manual techniques;Neuromuscular re-education;Cryotherapy;Electrical Stimulation;Functional mobility training   PT Next Visit Plan Gentle flexibility, decompression, heat/modalties as needed.  Review decompression.  Route note to MD- pt has appt on 10/07/14   Consulted and Agree with Plan of Care Patient        Problem List Patient Active Problem List   Diagnosis Date Noted  . Restless legs syndrome (RLS) 12/25/2012  . Lumbosacral root lesions, not elsewhere classified 12/25/2012  . Postop Transfusion 01/07/2012  . Postop Acute blood loss anemia 01/06/2012  . Postop Hyponatremia 01/06/2012  . Failed total hip arthroplasty 01/05/2012    Benedetta Sundstrom, PT 10/01/2014, 10:17 AM   [facility] [address], [place], [place], [address] Phone: [phone]   Fax:  [phone]

## 2014-10-01 NOTE — Patient Instructions (Signed)
RE-ALIGNMENT ROUTINE EXERCISES-OSTEOPROROSIS BASIC FOR POSTURAL CORRECTION   RE-ALIGNMENT Tips BENEFITS: 1.It helps to re-align the curves of the back and improve standing posture. 2.It allows the back muscles to rest and strengthen in preparation for more activity. FREQUENCY: Daily, even after weeks, months and years of more advanced exercises. START: 1.All exercises start in the same position: lying on the back, arms resting on the supporting surface, palms up and slightly away from the body, backs of hands down, knees bent, feet flat. 2.The head, neck, arms, and legs are supported according to specific instructions of your therapist. Copyright  VHI. All rights reserved.    1. Decompression Exercise: Basic.   Takes compression off the vertebral bodies; increases tolerance for lying on the back; helps relieve back pain   Lie on back on firm surface, knees bent, feet flat, arms turned up, out to sides (~35 degrees). Head neck and arms supported as necessary. Time _5-15__ minutes. Surface: floor     2. Shoulder Press  Strengthens upper back extensors and scapular retractors.   Press both shoulders down. Hold _2-3__ seconds. Repeat _3-5__ times. Surface: floor    4. Leg Lengthener: stretches quadratus lumborum and hip flexors.  Strengthens quads and ankle dorsiflexors.   Straighten one leg. Pull toes AND forefoot toward knee, extend heel. Lengthen leg by pulling pelvis away from ribs. Hold _2-3__ seconds. Relax. Repeat 1 time. Re-bend knee. Do other leg. Each leg 4-6___ times. Surface: floor  Copyright  VHI. All rights reserved.   5. Leg Press.   Strengthens gluteus maximus, lower erector spinae and ankle dorsiflexors.   Repeat the above motion (Leg lengthener) and press entire leg downward.  Imagine making an impression of the leg in the sand.  Hold 2-3 seconds and relax.  Repeat on the other leg.  Each leg 4-6 times.        Lifting Principles  .Maintain proper posture  and head alignment. .Slide object as close as possible before lifting. .Move obstacles out of the way. .Test before lifting; ask for help if too heavy. .Tighten stomach muscles without holding breath. .Use smooth movements; do not jerk. .Use legs to do the work, and pivot with feet. .Distribute the work load symmetrically and close to the center of trunk. .Push instead of pull whenever possible.   Squat down and hold basket close to stand. Use leg muscles to do the work.    Avoid twisting or bending back. Pivot around using foot movements, and bend at knees if needed when reaching for articles.        Getting Into / Out of Bed   Lower self to lie down on one side by raising legs and lowering head at the same time. Use arms to assist moving without twisting. Bend both knees to roll onto back if desired. To sit up, start from lying on side, and use same move-ments in reverse. Keep trunk aligned with legs.    Shift weight from front foot to back foot as item is lifted off shelf.    When leaning forward to pick object up from floor, extend one leg out behind. Keep back straight. Hold onto a sturdy support with other hand.      Sit upright, head facing forward. Try using a roll to support lower back. Keep shoulders relaxed, and avoid rounded back. Keep hips level with knees. Avoid crossing legs for long periods.     [facility] [address] [place], [place] [address] Phone #  [phone] Fax [phone]

## 2014-10-03 ENCOUNTER — Ambulatory Visit: Payer: Medicare Other

## 2014-10-03 DIAGNOSIS — M545 Low back pain, unspecified: Secondary | ICD-10-CM

## 2014-10-03 DIAGNOSIS — M533 Sacrococcygeal disorders, not elsewhere classified: Secondary | ICD-10-CM

## 2014-10-03 DIAGNOSIS — R293 Abnormal posture: Secondary | ICD-10-CM

## 2014-10-03 NOTE — Therapy (Signed)
[facility] [address], [place], [place], [address] Phone: [phone]   Fax:  [phone]  Physical Therapy Treatment  Patient Details  Name: Nancy Baxter MRN: [id number] Date of Birth: June 30, 1940 Referring Provider:  Jessy Oto, MD  Encounter Date: 10/03/2014      PT End of Session - 10/03/14 1003    Visit Number 7   Number of Visits 10  Medicare   Date for PT Re-Evaluation 12/06/14   PT Start Time 0933   PT Stop Time 1012   PT Time Calculation (min) 39 min   Activity Tolerance Patient tolerated treatment well   Behavior During Therapy [facility] for tasks assessed/performed      Past Medical History  Diagnosis Date  . Complication of anesthesia   . PONV (postoperative nausea and vomiting)   . Hypertension   . GERD (gastroesophageal reflux disease)   . Arthritis   . Anemia   . Heart murmur     evaluated 75yr ago-no cardiac follow up needed    Past Surgical History  Procedure Laterality Date  . Back surgery  1961    spinal fusion age   . Vaginal hysterectomy    . Tonsillectomy      age 74  . Foot arthrodesis  5/12    hammer toes,bunionectomy  . Trigger finger release  02/23/2011    Procedure: RELEASE TRIGGER FINGER/A-1 PULLEY;  Surgeon: Wynonia Sours, MD;  Location: [place];  Service: Orthopedics;  Laterality: Right;  . Ear cyst excision  02/23/2011  . Total hip revision  01/05/2012    Procedure: TOTAL HIP REVISION;  Surgeon: Gearlean Alf, MD;  [facility];  Service: Orthopedics;  Laterality: Left;  . Joint replacement  2006    rt hip  . Joint replacement  2008    lt hip  . Trigger finger release Left 07/26/2012    Procedure: RELEASE TRIGGER FINGER/A-1 PULLEY LEFT SMALL FINGER;  Surgeon: Wynonia Sours, MD;  Location: [place];  Service: Orthopedics;  Laterality: Left;    There were no vitals filed for this visit.  Visit Diagnosis:  Bilateral low back  pain without sciatica - Plan: PT plan of care cert/re-cert  Posture abnormality - Plan: PT plan of care cert/re-cert  Sacroiliac dysfunction - Plan: PT plan of care cert/re-cert      Subjective Assessment - 10/03/14 0940    Subjective Going to MD on Monday.  Able to walk straighter now.     Currently in Pain? Yes   Pain Score 1    Pain Location Back   Pain Orientation Left;Lower   Pain Descriptors / Indicators Aching   Pain Type Chronic pain            [facility] PT Assessment - 10/03/14 0001    Assessment   Medical Diagnosis bilateral sacroilitis   Onset Date/Surgical Date 12/21/13   Prior Function   Level of Independence Independent with basic ADLs   Vocation Retired   Observation/Other Assessments   Focus on Therapeutic Outcomes (FOTO)  46% limitation                     [facility] Adult PT Treatment/Exercise - 10/03/14 0001    Exercises   Exercises Shoulder   Lumbar Exercises: Stretches   Active Hamstring Stretch 3 reps;20 seconds  seated at edge of mat   Lumbar Exercises: Aerobic   Stationary Bike Level 2x 9 minutes  Shoulder Exercises: Seated   Horizontal ABduction Strengthening;Both;20 reps   Shoulder Exercises: Standing   Extension Strengthening;20 reps   Theraband Level (Shoulder Extension) Level 2 (Red)   Row Strengthening;Both;20 reps;Theraband   Theraband Level (Shoulder Row) Level 2 (Red)                PT Education - 10/03/14 1003    Education provided Yes   Education Details HEP: red theraband: horizontal abduction, rowing and bilateral shoulder extension   Person(s) Educated Patient   Methods Demonstration;Explanation;Handout   Comprehension Verbalized understanding;Returned demonstration          PT Short Term Goals - 10/01/14 0953    PT SHORT TERM GOAL #2   Title stand with upright posture for ADLs and self-care   Time 4   Period Weeks   Status On-going  pt reports increased ease-hardes in the morning            PT Long Term Goals - 10/03/14 0939    PT LONG TERM GOAL #2   Title reduce FOTO to < or = to 48% limitation   Status Achieved  46% limitation   PT LONG TERM GOAL #3   Title stand and walk for 30 minutes without limitaiton to allow for community activity   Time 8   Period Weeks   Status On-going  this is inconsistent   PT LONG TERM GOAL #4   Title report a 50% reduction lumbar/SI joint pain with ADLs and volunteer tasks   Time 8   Period Weeks   Status On-going               Plan - 10/03/14 0949    Clinical Impression Statement Pt with 60% overall improvement since the start of care and is able to stand with neutral and upright posture with more consistently.  Pt with 1-6/10 LBP throughout the day.  See goals for current status.  Pt will be out of town until 10/27/14 and will work on [employer] for flexibility and strength when away.  Pt will benefit from PT when she returns to address posture, core strength and endurance for return to normal function.    Pt will benefit from skilled therapeutic intervention in order to improve on the following deficits Abnormal gait;Decreased range of motion;Difficulty walking;Impaired flexibility;Postural dysfunction;Pain   Rehab Potential Good   PT Frequency 2x / week   PT Duration 6 weeks   PT Treatment/Interventions ADLs/Self Care Home Management;Therapeutic activities;Patient/family education;Passive range of motion;Therapeutic exercise;Ultrasound;Manual techniques;Neuromuscular re-education;Cryotherapy;Electrical Stimulation;Functional mobility training   PT Next Visit Plan Gentle flexibility, decompression, heat/modalties as needed.  Advance to higher level strength exercises   Consulted and Agree with Plan of Care Patient        Problem List Patient Active Problem List   Diagnosis Date Noted  . Restless legs syndrome (RLS) 12/25/2012  . Lumbosacral root lesions, not elsewhere classified 12/25/2012  . Postop Transfusion 01/07/2012  .  Postop Acute blood loss anemia 01/06/2012  . Postop Hyponatremia 01/06/2012  . Failed total hip arthroplasty 01/05/2012    Makensey Rego, PT 10/03/2014, 10:11 AM  [facility] [address] [place], [place], [address] Phone: [phone]   Fax:  [phone]

## 2014-10-03 NOTE — Patient Instructions (Signed)
KEEP HEAD IN NEUTRAL AND SHOULDERS DOWN AND RELAXED   Hold tubing in right hand, arm forward. Pull arm back, elbow straight. Repeat __10__ times per set. Do __2__ sets per session. Do _1-2___ sessions per day.  Copyright  VHI. All rights reserved.     With resistive band anchored in door, grasp both ends. Keeping elbows bent, pull back, squeezing shoulder blades together. Hold _3__ seconds. Repeat _2x10___ times. Do _1-2___ sessions per day.   Resisted Horizontal Abduction: Bilateral   Sit or stand, tubing in both hands, arms out in front. Keeping arms straight, pinch shoulder blades together and stretch arms out. Repeat _10___ times per set. Do 2____ sets per session. Do _1-2___ sessions per day.   [place] [address], [place], [place] [address] Phone # [phone] Fax [phone]

## 2014-10-08 ENCOUNTER — Encounter: Payer: Medicare Other | Admitting: Physical Therapy

## 2014-10-28 DIAGNOSIS — I872 Venous insufficiency (chronic) (peripheral): Secondary | ICD-10-CM | POA: Insufficient documentation

## 2014-10-31 ENCOUNTER — Ambulatory Visit: Payer: Medicare Other | Attending: Specialist

## 2014-10-31 DIAGNOSIS — M533 Sacrococcygeal disorders, not elsewhere classified: Secondary | ICD-10-CM | POA: Diagnosis present

## 2014-10-31 DIAGNOSIS — R293 Abnormal posture: Secondary | ICD-10-CM | POA: Insufficient documentation

## 2014-10-31 DIAGNOSIS — M545 Low back pain, unspecified: Secondary | ICD-10-CM

## 2014-10-31 NOTE — Therapy (Signed)
[facility] [address], [place], [place], [address] Phone: [phone]   Fax:  [phone]  Physical Therapy Treatment  Patient Details  Name: Nancy Baxter MRN: [id number] Date of Birth: 02-26-1941 Referring Provider:  Jessy Oto, MD  Encounter Date: 10/31/2014      PT End of Session - 10/31/14 1005    Visit Number 8   Number of Visits 10  Medicare   Date for PT Re-Evaluation 12/06/14   PT Start Time 0931   PT Stop Time 1013   PT Time Calculation (min) 42 min   Activity Tolerance Patient tolerated treatment well   Behavior During Therapy [facility] for tasks assessed/performed      Past Medical History  Diagnosis Date  . Complication of anesthesia   . PONV (postoperative nausea and vomiting)   . Hypertension   . GERD (gastroesophageal reflux disease)   . Arthritis   . Anemia   . Heart murmur     evaluated 75yr ago-no cardiac follow up needed    Past Surgical History  Procedure Laterality Date  . Back surgery  1961    spinal fusion age   . Vaginal hysterectomy    . Tonsillectomy      age 48  . Foot arthrodesis  5/12    hammer toes,bunionectomy  . Trigger finger release  02/23/2011    Procedure: RELEASE TRIGGER FINGER/A-1 PULLEY;  Surgeon: Wynonia Sours, MD;  Location: [place];  Service: Orthopedics;  Laterality: Right;  . Ear cyst excision  02/23/2011  . Total hip revision  01/05/2012    Procedure: TOTAL HIP REVISION;  Surgeon: Gearlean Alf, MD;  [facility];  Service: Orthopedics;  Laterality: Left;  . Joint replacement  2006    rt hip  . Joint replacement  2008    lt hip  . Trigger finger release Left 07/26/2012    Procedure: RELEASE TRIGGER FINGER/A-1 PULLEY LEFT SMALL FINGER;  Surgeon: Wynonia Sours, MD;  Location: [place];  Service: Orthopedics;  Laterality: Left;    There were no vitals filed for this visit.  Visit Diagnosis:  Bilateral low back  pain without sciatica  Posture abnormality  Sacroiliac dysfunction      Subjective Assessment - 10/31/14 0933    Subjective Pt has been on vacation since 10/03/14.  Pt reports that she was really busy while she was away.     Currently in Pain? Yes   Pain Score 3    Pain Location Back   Pain Orientation Right;Left;Lower   Pain Descriptors / Indicators Aching   Pain Onset More than a month ago   Pain Frequency Constant   Aggravating Factors  standing and walking long periods   Pain Relieving Factors sitting down, Aleve, heat, icy hot, TENs unit                         [facility] Adult PT Treatment/Exercise - 10/31/14 0001    Lumbar Exercises: Aerobic   Stationary Bike Level 2x 8 minutes  PT present to discuss progress   UBE (Upper Arm Bike) seated on red ball Level 1x6 minutes (3/3)   Shoulder Exercises: Seated   Horizontal ABduction Strengthening;Both;20 reps   Theraband Level (Shoulder Horizontal ABduction) Level 2 (Red)   Shoulder Exercises: Standing   Extension Strengthening;20 reps   Theraband Level (Shoulder Extension) Level 2 (Red)   Row Strengthening;Both;20 reps;Theraband   Theraband  Level (Shoulder Row) Level 2 (Red)                  PT Short Term Goals - 10/01/14 0953    PT SHORT TERM GOAL #2   Title stand with upright posture for ADLs and self-care   Time 4   Period Weeks   Status On-going  pt reports increased ease-hardes in the morning           PT Long Term Goals - 10/31/14 0935    PT LONG TERM GOAL #1   Title be independent in advanced HEP   Period Weeks   Status On-going  independent in current HEP   PT LONG TERM GOAL #2   Title reduce FOTO to < or = to 48% limitation   Status Achieved  46% limitation   PT LONG TERM GOAL #3   Title stand and walk for 30 minutes without limitaiton to allow for community activity   Time 8   Period Weeks   Status Achieved  1 hour   PT LONG TERM GOAL #4   Title report a 50% reduction  lumbar/SI joint pain with ADLs and volunteer tasks   Time 8   Period Weeks   Status Achieved  80% reduction reported               Plan - 10/31/14 0175    Clinical Impression Statement Pt reports 80% improvement since the start of care and is able to stand with neutral and upright posture more consistently.  Pt is no longer having pain in hip, pain is centralized to low back now.  Pt will benefit from continued PT for core strength, endurance and flexibility.     Pt will benefit from skilled therapeutic intervention in order to improve on the following deficits Abnormal gait;Decreased range of motion;Difficulty walking;Impaired flexibility;Postural dysfunction;Pain   Rehab Potential Good   PT Frequency 2x / week   PT Duration 6 weeks   PT Treatment/Interventions ADLs/Self Care Home Management;Therapeutic activities;Patient/family education;Passive range of motion;Therapeutic exercise;Ultrasound;Manual techniques;Neuromuscular re-education;Cryotherapy;Electrical Stimulation;Functional mobility training   PT Next Visit Plan Gentle flexibility, decompression, heat/modalties as needed.  Advance to higher level strength exercises   Consulted and Agree with Plan of Care Patient        Problem List Patient Active Problem List   Diagnosis Date Noted  . Restless legs syndrome (RLS) 12/25/2012  . Lumbosacral root lesions, not elsewhere classified 12/25/2012  . Postop Transfusion 01/07/2012  . Postop Acute blood loss anemia 01/06/2012  . Postop Hyponatremia 01/06/2012  . Failed total hip arthroplasty 01/05/2012    TAKACS,KELLY, PT 10/31/2014, 10:07 AM  [facility] [address] [place], [place], [address] Phone: [phone]   Fax:  [phone]

## 2014-11-04 ENCOUNTER — Ambulatory Visit: Payer: Medicare Other | Admitting: Physical Therapy

## 2014-11-07 ENCOUNTER — Ambulatory Visit: Payer: Medicare Other

## 2014-11-07 DIAGNOSIS — M545 Low back pain, unspecified: Secondary | ICD-10-CM

## 2014-11-07 DIAGNOSIS — R293 Abnormal posture: Secondary | ICD-10-CM

## 2014-11-07 NOTE — Therapy (Addendum)
[facility] [address] [place], [place], [address] Phone: [phone]   Fax:  [phone]  Physical Therapy Treatment  Patient Details  Name: Nancy Baxter MRN: [id number] Date of Birth: May 23, 1940 Referring Provider:  Jessy Oto, MD  Encounter Date: 11/07/2014      PT End of Session - 11/07/14 1055    Visit Number 9   Number of Visits 10  Medicare   Date for PT Re-Evaluation 12/06/14   PT Start Time 3419   PT Stop Time 1057   PT Time Calculation (min) 42 min   Activity Tolerance Patient tolerated treatment well   Behavior During Therapy [facility] for tasks assessed/performed      Past Medical History  Diagnosis Date  . Complication of anesthesia   . PONV (postoperative nausea and vomiting)   . Hypertension   . GERD (gastroesophageal reflux disease)   . Arthritis   . Anemia   . Heart murmur     evaluated 48yrago-no cardiac follow up needed    Past Surgical History  Procedure Laterality Date  . Back surgery  1961    spinal fusion age   . Vaginal hysterectomy    . Tonsillectomy      age 74 . Foot arthrodesis  5/12    hammer toes,bunionectomy  . Trigger finger release  02/23/2011    Procedure: RELEASE TRIGGER FINGER/A-1 PULLEY;  Surgeon: GWynonia Sours MD;  [place]  Service: Orthopedics;  Laterality: Right;  . Ear cyst excision  02/23/2011  . Total hip revision  01/05/2012    Procedure: TOTAL HIP REVISION;  Surgeon: FGearlean Alf MD;  [facility];  Service: Orthopedics;  Laterality: Left;  . Joint replacement  2006    rt hip  . Joint replacement  2008    lt hip  . Trigger finger release Left 07/26/2012    Procedure: RELEASE TRIGGER FINGER/A-1 PULLEY LEFT SMALL FINGER;  Surgeon: GWynonia Sours MD;  [place]  Service: Orthopedics;  Laterality: Left;    There were no vitals filed for this visit.  Visit Diagnosis:  Bilateral low back  pain without sciatica  Posture abnormality      Subjective Assessment - 11/07/14 1019    Subjective Pt saw MD yesterday and he was focused on fractures in her foot.  Didn't mention my back.     Currently in Pain? Yes   Pain Score 3    Pain Orientation Right;Left;Lower   Pain Descriptors / Indicators Aching   Pain Type Chronic pain   Pain Onset More than a month ago   Pain Frequency Constant   Aggravating Factors  standing and walking long periods   Pain Relieving Factors sitting down, heat, icy hot, TENs            [facility] PT Assessment - 11/07/14 0001    Observation/Other Assessments   Focus on Therapeutic Outcomes (FOTO)  39% limitation                     [facility] Adult PT Treatment/Exercise - 11/07/14 0001    Lumbar Exercises: Aerobic   Stationary Bike Level 2x 8 minutes  PT present to discuss progress   UBE (Upper Arm Bike) seated on red ball Level 1x6 minutes (3/3)   Lumbar Exercises: Supine   Other Supine Lumbar Exercises supine on foam: yellow theraband horizontal abduction and D2 3x10 each   Other  Supine Lumbar Exercises supine on foam roll for decompression                  PT Short Term Goals - 10/01/14 0953    PT SHORT TERM GOAL #2   Title stand with upright posture for ADLs and self-care   Time 4   Period Weeks   Status On-going  pt reports increased ease-hardes in the morning           PT Long Term Goals - 11/07/14 1021    PT LONG TERM GOAL #1   Title be independent in advanced HEP   Time 8   Period Weeks   Status On-going  independent with current HEP   PT LONG TERM GOAL #2   Title reduce FOTO to < or = to 48% limitation   Status Achieved  39% limitation   PT LONG TERM GOAL #3   Title stand and walk for 30 minutes without limitaiton to allow for community activity   Status Achieved  1 hour               Plan - 11/07/14 1022    Clinical Impression Statement Pt with 3/10 Rt LE pain today and reports that pain  has not flared-up recently.  Pt is independent in current HEP and continues to demonstrate hip stiffness and forard trunk flexion due to pain.  Pt will benefit from continued strengthening, flexibility and endurance progression.     Pt will benefit from skilled therapeutic intervention in order to improve on the following deficits Abnormal gait;Decreased range of motion;Difficulty walking;Impaired flexibility;Postural dysfunction;Pain   Rehab Potential Good   PT Frequency 2x / week   PT Duration 6 weeks   PT Treatment/Interventions ADLs/Self Care Home Management;Therapeutic activities;Patient/family education;Passive range of motion;Therapeutic exercise;Ultrasound;Manual techniques;Neuromuscular re-education;Cryotherapy;Electrical Stimulation;Functional mobility training   PT Next Visit Plan Gentle flexibility, decompression, heat/modalties as needed.  Advance to higher level strength exercises.  Probable D/C if pt is still doing well.     Consulted and Agree with Plan of Care Patient     G-codes: Other PT category Current Status: CK Goal status: CK  Problem List Patient Active Problem List   Diagnosis Date Noted  . Restless legs syndrome (RLS) 12/25/2012  . Lumbosacral root lesions, not elsewhere classified 12/25/2012  . Postop Transfusion 01/07/2012  . Postop Acute blood loss anemia 01/06/2012  . Postop Hyponatremia 01/06/2012  . Failed total hip arthroplasty 01/05/2012    Gaylene Moylan , PT 11/07/2014, 10:56 AM PHYSICAL THERAPY DISCHARGE SUMMARY  Visits from Start of Care: 9  Current functional level related to goals / functional outcomes: See above for goal status.  Pt didn't return after session 11/07/14.    Remaining deficits: Mild pain but significantly improved.  Pt has HEP in place and will follow-up with MD as needed.     Education / Equipment: HEP, Economist education Plan: Patient agrees to discharge.  Patient goals were partially met. Patient is being discharged  due to being pleased with the current functional level.  ?????    Sigurd Sos, [place] 12/03/2014 9:33 AM  [facility] [address] [place], [place], [address] Phone: [phone]   Fax:  [phone]

## 2014-12-27 ENCOUNTER — Other Ambulatory Visit: Payer: Self-pay | Admitting: Neurology

## 2015-03-17 ENCOUNTER — Ambulatory Visit (INDEPENDENT_AMBULATORY_CARE_PROVIDER_SITE_OTHER): Payer: Medicare Other | Admitting: Nurse Practitioner

## 2015-03-17 ENCOUNTER — Encounter: Payer: Self-pay | Admitting: Nurse Practitioner

## 2015-03-17 VITALS — BP 115/66 | HR 61 | Ht 60.0 in | Wt 149.8 lb

## 2015-03-17 DIAGNOSIS — G2581 Restless legs syndrome: Secondary | ICD-10-CM | POA: Diagnosis not present

## 2015-03-17 MED ORDER — ROPINIROLE HCL 0.25 MG PO TABS: ORAL_TABLET | ORAL | Status: DC

## 2015-03-17 MED ORDER — REQUIP XL 2 MG PO TB24: 4.0000 mg | ORAL_TABLET | Freq: Every day | ORAL | Status: DC

## 2015-03-17 NOTE — Patient Instructions (Addendum)
Continue Requip XL 2 mg, 2 tabs at bedtime will refill Requip 0.25 at 3:00 PM, will refill Continue exercise program Follow-up in 1 year

## 2015-03-17 NOTE — Progress Notes (Signed)
[facility]  PATIENT: Nancy Baxter FIRST DOB: 1940/08/10   REASON FOR VISIT: Follow-up was for restless legs HISTORY FROM: Patient    HISTORY OF PRESENT ILLNESS:[name], 74 year old female returns for follow up. She has a history of restless legs and has failed generic  Requip XL. She is currently on name brand which requires prior AUTHORIZATION. She was last seen in this office 09/12/14. She has failed Neupro patch, she felt it made her restless legs worse. She noticed that a glass of red wine has also a detrimental affect and also caffeine.   She continues with short acting dose in the afternoon. She has not given up any activities, she remains very active. She walks for exercise .  She returns for reevaluation.   HISTORY: decade history of restless legs, and found relief from Requip - responded very well to Requip XL and stated " it gave me my life back"/  she recently observed that the generic Requip may trigger some restless legs in the earlier day time .  has no other problems, she takes an additional dose of her Requip every day, a short-acting dose , currently at 3 PM. 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  Restless legs do not interfere with this . she is taking her extended release Requip around 8:30 at night she does not go to bed until 11 or 12 PM.    REVIEW OF SYSTEMS: Full 14 system review of systems performed and notable only for those listed, all others are neg:  Constitutional: neg  Cardiovascular: neg Ear/Nose/Throat: neg  Skin: neg Eyes: neg Respiratory: neg Gastroitestinal: neg  Hematology/Lymphatic: neg  Endocrine: neg Musculoskeletal: Joint pain Allergy/Immunology: neg Neurological: neg Psychiatric: neg Sleep : Restless legs   ALLERGIES: Allergies  Allergen Reactions  . Sulfa Antibiotics Hives  . Codeine Nausea And Vomiting  . Prednisone Other (See Comments)    Shuts down  adrenals  . Zolpidem Tartrate Other (See Comments)    sleepwalks  . Adhesive [Tape] Rash    blisters    HOME MEDICATIONS: Outpatient Prescriptions Prior to Visit  Medication Sig Dispense Refill  . aspirin EC 81 MG tablet Take 81 mg by mouth daily.    . calcium carbonate (OS-CAL) 1250 MG chewable tablet Chew 1 tablet by mouth daily.    . ferrous fumarate (HEMOCYTE - 106 MG FE) 325 (106 FE) MG TABS Take 37 mg of iron by mouth 2 (two) times a week. Pt takes two times weekly - Monday and thursdays    . Fish Oil-Cholecalciferol (FISH OIL + D3 PO) Take by mouth.    . fluorouracil (EFUDEX) 5 % cream as needed.     Marland Kitchen losartan (COZAAR) 50 MG tablet Take 50 mg by mouth every morning.     . metoprolol (TOPROL-XL) 50 MG 24 hr tablet Take 50 mg by mouth every morning.     Marland Kitchen omeprazole (PRILOSEC) 20 MG capsule Take 20 mg by mouth daily.     . REQUIP XL 2 MG 24 hr tablet TAKE TWO TABLETS BY MOUTH AT BEDTIME 180 tablet 0  . rOPINIRole (REQUIP) 0.25 MG tablet Use at 3 Pm po. 90 tablet 1  . triamterene-hydrochlorothiazide (MAXZIDE-25) 37.5-25 MG per tablet Take 0.5 tablets by mouth every morning.    . naproxen sodium (ANAPROX) 220 MG tablet Take 220 mg by mouth. Takes 2 tabs every am.     No facility-administered medications prior to visit.  PAST MEDICAL HISTORY: Past Medical History  Diagnosis Date  . Complication of anesthesia   . PONV (postoperative nausea and vomiting)   . Hypertension   . GERD (gastroesophageal reflux disease)   . Arthritis   . Anemia   . Heart murmur     evaluated 57yr ago-no cardiac follow up needed    PAST SURGICAL HISTORY: Past Surgical History  Procedure Laterality Date  . Back surgery  1961    spinal fusion age   . Vaginal hysterectomy    . Tonsillectomy      age 62  . Foot arthrodesis  5/12    hammer toes,bunionectomy  . Trigger finger release  02/23/2011    Procedure: RELEASE TRIGGER FINGER/A-1 PULLEY;  Surgeon: Wynonia Sours, MD;  Location: [place];  Service: Orthopedics;  Laterality: Right;  . Ear cyst excision  02/23/2011  . Total hip revision  01/05/2012    Procedure: TOTAL HIP REVISION;  Surgeon: Gearlean Alf, MD;  Location: [facility];  Service: Orthopedics;  Laterality: Left;  . Joint replacement  2006    rt hip  . Joint replacement  2008    lt hip  . Trigger finger release Left 07/26/2012    Procedure: RELEASE TRIGGER FINGER/A-1 PULLEY LEFT SMALL FINGER;  Surgeon: Wynonia Sours, MD;  Location: [place];  Service: Orthopedics;  Laterality: Left;    FAMILY HISTORY: Family History  Problem Relation Age of Onset  . Colon cancer Neg Hx   . Esophageal cancer Neg Hx   . Rectal cancer Neg Hx   . Stomach cancer Neg Hx     SOCIAL HISTORY: Social History   Social History  . Marital Status: Married    Spouse Name: [name]   . Number of Children: 5  . Years of Education: College   Occupational History  .      retired   Social History Main Topics  . Smoking status: Never Smoker   . Smokeless tobacco: Never Used  . Alcohol Use: 0.0 oz/week     Comment: occasional  . Drug Use: No  . Sexual Activity: Not on file   Other Topics Concern  . Not on file   Social History Narrative   Patient is retired and married. [name])   Patient has some college education.    Patient has 5 children.    Right handed   Caffeine two cups of caffeine daily ( coffee)     PHYSICAL EXAM  Filed Vitals:   03/17/15 1304  Weight: 149 lb 12.8 oz (67.949 kg)   Body mass index is 29.03 kg/(m^2). Generalized: Well developed, in no acute distress   Neurological examination   Mentation: Alert oriented to time, place, history taking. Attention span and concentration appropriate. Recent and remote memory intact. Follows all commands speech and language fluent.  Cranial nerve II-XII: .Pupils were equal round reactive to light extraocular movements were full, visual field were full on confrontational test. Facial  sensation and strength were normal. hearing was intact to finger rubbing bilaterally. Uvula tongue midline. head turning and shoulder shrug were normal and symmetric.Tongue protrusion into cheek strength was normal. Motor: normal bulk and tone, full strength in the BUE, BLE, fine finger movements normal, no pronator drift. No focal weakness Sensory: normal and symmetric to light touch, pinprick, and Vibration, proprioception  Coordination: finger-nose-finger, heel-to-shin bilaterally, no dysmetria Reflexes: 1+ upper lower and symmetric plantar responses were flexor bilaterally. Gait and Station: Rising up from seated  position without assistance, normal stance, moderate stride, good arm swing, smooth turning, able to perform tiptoe, and heel walking without difficulty. Tandem gait is steady    DIAGNOSTIC DATA (LABS, IMAGING, TESTING) - ASSESSMENT AND PLAN  74 y.o. year old female  has a past medical history of restless leg syndrome here to follow-up.   Continue Requip XL 2 mg, 2 tabs at bedtime will refill Requip 0.25 at 3:00 PM, will refill Continue exercise program Follow-up in 6 months Dennie Bible, [facility], [facility], APRN  [facility] [address] [place], [place] [address] [phone]

## 2015-03-17 NOTE — Progress Notes (Signed)
I agree with the assessment and plan as directed by NP .The patient is known to me .   Nephi Savage, MD  

## 2015-03-20 ENCOUNTER — Telehealth: Payer: Self-pay | Admitting: Nurse Practitioner

## 2015-03-20 NOTE — Telephone Encounter (Signed)
Nancy Baxter/[facility] [phone] called requesting directions on dosing, Rx directions say use at 3pm, they need to know how many tablets she's using.

## 2015-03-20 NOTE — Telephone Encounter (Signed)
I called and spoke to [place].  Pt takes one Requip 0.25mg  tablet at 1500.  She verbalized understanding.

## 2015-06-24 ENCOUNTER — Telehealth: Payer: Self-pay | Admitting: Nurse Practitioner

## 2015-06-24 MED ORDER — ROPINIROLE HCL ER 2 MG PO TB24: 4.0000 mg | ORAL_TABLET | Freq: Every day | ORAL | Status: DC

## 2015-06-24 NOTE — Telephone Encounter (Signed)
I called pt to clarify.  It is the XL tab (2 mg tabs).  She takes 4mg  po at bedtime (2030).  Done.

## 2015-06-24 NOTE — Telephone Encounter (Signed)
Ok with me 

## 2015-06-24 NOTE — Telephone Encounter (Signed)
Pt called sts rOPINIRole (REQUIP) 0.25 MG tablet XL cost her $500 last month. She is wanting to try the generic for 1 month. Please call to [facility] on [address]. Please call pt on [phone]

## 2015-08-25 DIAGNOSIS — S81811A Laceration without foreign body, right lower leg, initial encounter: Secondary | ICD-10-CM | POA: Diagnosis not present

## 2015-09-02 DIAGNOSIS — H16223 Keratoconjunctivitis sicca, not specified as Sjogren's, bilateral: Secondary | ICD-10-CM | POA: Diagnosis not present

## 2015-09-02 DIAGNOSIS — H524 Presbyopia: Secondary | ICD-10-CM | POA: Diagnosis not present

## 2015-09-02 DIAGNOSIS — H52223 Regular astigmatism, bilateral: Secondary | ICD-10-CM | POA: Diagnosis not present

## 2015-09-02 DIAGNOSIS — H43813 Vitreous degeneration, bilateral: Secondary | ICD-10-CM | POA: Diagnosis not present

## 2015-11-03 DIAGNOSIS — Z1231 Encounter for screening mammogram for malignant neoplasm of breast: Secondary | ICD-10-CM | POA: Diagnosis not present

## 2015-11-03 DIAGNOSIS — N958 Other specified menopausal and perimenopausal disorders: Secondary | ICD-10-CM | POA: Diagnosis not present

## 2015-11-03 DIAGNOSIS — Z01419 Encounter for gynecological examination (general) (routine) without abnormal findings: Secondary | ICD-10-CM | POA: Diagnosis not present

## 2015-11-03 DIAGNOSIS — Z6826 Body mass index (BMI) 26.0-26.9, adult: Secondary | ICD-10-CM | POA: Diagnosis not present

## 2015-11-21 DIAGNOSIS — Z8041 Family history of malignant neoplasm of ovary: Secondary | ICD-10-CM | POA: Diagnosis not present

## 2016-01-19 DIAGNOSIS — R8299 Other abnormal findings in urine: Secondary | ICD-10-CM | POA: Diagnosis not present

## 2016-01-19 DIAGNOSIS — I10 Essential (primary) hypertension: Secondary | ICD-10-CM | POA: Diagnosis not present

## 2016-01-26 DIAGNOSIS — I129 Hypertensive chronic kidney disease with stage 1 through stage 4 chronic kidney disease, or unspecified chronic kidney disease: Secondary | ICD-10-CM | POA: Diagnosis not present

## 2016-01-26 DIAGNOSIS — M4180 Other forms of scoliosis, site unspecified: Secondary | ICD-10-CM | POA: Diagnosis not present

## 2016-01-26 DIAGNOSIS — R012 Other cardiac sounds: Secondary | ICD-10-CM | POA: Diagnosis not present

## 2016-01-26 DIAGNOSIS — N183 Chronic kidney disease, stage 3 (moderate): Secondary | ICD-10-CM | POA: Diagnosis not present

## 2016-01-26 DIAGNOSIS — Z23 Encounter for immunization: Secondary | ICD-10-CM | POA: Diagnosis not present

## 2016-01-26 DIAGNOSIS — K219 Gastro-esophageal reflux disease without esophagitis: Secondary | ICD-10-CM | POA: Diagnosis not present

## 2016-01-26 DIAGNOSIS — Z6825 Body mass index (BMI) 25.0-25.9, adult: Secondary | ICD-10-CM | POA: Diagnosis not present

## 2016-01-26 DIAGNOSIS — Z1389 Encounter for screening for other disorder: Secondary | ICD-10-CM | POA: Diagnosis not present

## 2016-01-26 DIAGNOSIS — R600 Localized edema: Secondary | ICD-10-CM | POA: Diagnosis not present

## 2016-01-26 DIAGNOSIS — I872 Venous insufficiency (chronic) (peripheral): Secondary | ICD-10-CM | POA: Diagnosis not present

## 2016-01-26 DIAGNOSIS — Z0000 Encounter for general adult medical examination without abnormal findings: Secondary | ICD-10-CM | POA: Diagnosis not present

## 2016-01-26 DIAGNOSIS — E871 Hypo-osmolality and hyponatremia: Secondary | ICD-10-CM | POA: Diagnosis not present

## 2016-03-16 ENCOUNTER — Ambulatory Visit: Payer: Medicare Other | Admitting: Nurse Practitioner

## 2016-03-16 ENCOUNTER — Encounter: Payer: Self-pay | Admitting: Neurology

## 2016-03-16 ENCOUNTER — Ambulatory Visit (INDEPENDENT_AMBULATORY_CARE_PROVIDER_SITE_OTHER): Payer: PPO | Admitting: Neurology

## 2016-03-16 VITALS — BP 132/60 | HR 68 | Resp 14 | Ht 60.0 in | Wt 124.0 lb

## 2016-03-16 DIAGNOSIS — G2581 Restless legs syndrome: Secondary | ICD-10-CM | POA: Diagnosis not present

## 2016-03-16 MED ORDER — ROPINIROLE HCL ER 2 MG PO TB24: 4.0000 mg | ORAL_TABLET | Freq: Every day | ORAL | 3 refills | Status: DC

## 2016-03-16 MED ORDER — ROPINIROLE HCL 0.25 MG PO TABS: ORAL_TABLET | ORAL | 3 refills | Status: DC

## 2016-03-16 NOTE — Progress Notes (Signed)
[facility]  PATIENT: Nancy Baxter DOB: 1941/03/06   REASON FOR VISIT:Followup for restless legs    HISTORY OF PRESENT ILLNESS:Nancy Baxter, a 75 year old female returns for follow up.  She claims that generic  Requip XL is not  working for her. We provided brand name, and she felt not better .  She says she can take the medicine and her legs begin to be restless. She noticed that a glass of red wine has also a detrimental affect. Prilosec seemed to decrease the effect, too.  She continues with short acting dose in the afternoon. She has not given up any activities, she remains very active.  She returns for reevaluation.     HISTORY: decade history of restless legs, and found relief from Requip - responded very well to Requip XL and stated " it gave me my life back"/  she recently observed that the generic Requip may trigger some restless legs in the earlier day time .  has no other problems, she takes an additional dose of her Requip  every day, a short-acting dose , currently at 3 PM. 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  Restless legs do not interfere with this . she is taking her extended release Requip around 8:30 at night she does not go to bed until 11 or 12 PM.   Interval history from 03/16/2016, Nancy Baxter had just had a visit with her primary care physician on 01/19/2016, Her CBC was in normal limits, cholesterol total was 171 mg/dL, HDL 47, glucose 95 normal creatinine at 1.2 mg/dL, white blood cell count 6.2, no sign of anemia, TSH was 1.87. She continues to experience restless legs but they do not impair her quality of life as they used to. According to the RLS 6 rating scale , which she endorsed it over the last 7 days and nights, she had no impairment in daytime and mild-to-moderate at nighttime. She has not had vertigo for several years now, she has kept her body weight stable, she is physically active. She  is here for routine reevaluation and refills on her medications. One of her question is that if her medications may cause restless legs or may promote restless legs. The only way to find out is to reduce the Requip dose and see if she is doing better. She is at this time not very impaired and there is a chance that she will have breakthrough activity in the reduce the dose. Since she is taking Requip at 2 different times of the day I would like for her to not take the morning dose. If after 8 days she feels that restless legs are breaking through I would want her to resume her previous medication regimen. We could also try to cut the nighttime dose in half because she takes 2 tablets at bedtime.      REVIEW OF SYSTEMS: Full 14 system review of systems performed and notable only for those listed, all others are neg:  Cardiovascular: no palpitation.  Musculoskeletal:back pain, third hip replacement. Titanium hip corroded. Now ceramic .   Sleep restless legs- beginning earlier 3PM.    ALLERGIES: Allergies  Allergen Reactions  . Sulfa Antibiotics Hives  . Codeine Nausea And Vomiting  . Prednisone Other (See Comments)    Shuts down adrenals  . Zolpidem Tartrate Other (See Comments)    sleepwalks  . Adhesive [Tape] Rash    blisters    HOME  MEDICATIONS: Outpatient Medications Prior to Visit  Medication Sig Dispense Refill  . amoxicillin (AMOXIL) 500 MG capsule 500 mg. Dental procedures  0  . aspirin EC 81 MG tablet Take 81 mg by mouth daily.    . calcium carbonate (OS-CAL) 1250 MG chewable tablet Chew 1 tablet by mouth daily.    . ferrous fumarate (HEMOCYTE - 106 MG FE) 325 (106 FE) MG TABS Take 37 mg of iron by mouth 2 (two) times a week. Pt takes two times weekly - Monday and thursdays    . Fish Oil-Cholecalciferol (FISH OIL + D3 PO) Take by mouth.    . fluorouracil (EFUDEX) 5 % cream as needed.     . furosemide (LASIX) 20 MG tablet 20 mg daily. Takes two tabs daily  2  . losartan  (COZAAR) 50 MG tablet Take 50 mg by mouth every morning.     . metoprolol (TOPROL-XL) 50 MG 24 hr tablet Take 50 mg by mouth every morning.     Marland Kitchen omeprazole (PRILOSEC) 20 MG capsule Take 20 mg by mouth daily.     Marland Kitchen rOPINIRole (REQUIP XL) 2 MG 24 hr tablet Take 2 tablets (4 mg total) by mouth at bedtime. 180 tablet 3  . rOPINIRole (REQUIP) 0.25 MG tablet Use at 3 Pm po. 90 tablet 3  . triamterene-hydrochlorothiazide (MAXZIDE-25) 37.5-25 MG per tablet Take 0.5 tablets by mouth every morning.     No facility-administered medications prior to visit.     PAST MEDICAL HISTORY: Past Medical History:  Diagnosis Date  . Anemia   . Arthritis   . Complication of anesthesia   . GERD (gastroesophageal reflux disease)   . Heart murmur    evaluated 27yr ago-no cardiac follow up needed  . Hypertension   . PONV (postoperative nausea and vomiting)     PAST SURGICAL HISTORY: Past Surgical History:  Procedure Laterality Date  . BACK SURGERY  1961   spinal fusion age   . EAR CYST EXCISION  02/23/2011  . FOOT ARTHRODESIS  5/12   hammer toes,bunionectomy  . JOINT REPLACEMENT  2006   rt hip  . JOINT REPLACEMENT  2008   lt hip  . TONSILLECTOMY     age 62  . TOTAL HIP REVISION  01/05/2012   Procedure: TOTAL HIP REVISION;  Surgeon: Gearlean Alf, MD;  Location: [facility];  Service: Orthopedics;  Laterality: Left;  . TRIGGER FINGER RELEASE  02/23/2011   Procedure: RELEASE TRIGGER FINGER/A-1 PULLEY;  Surgeon: Wynonia Sours, MD;  Location: [place];  Service: Orthopedics;  Laterality: Right;  . TRIGGER FINGER RELEASE Left 07/26/2012   Procedure: RELEASE TRIGGER FINGER/A-1 PULLEY LEFT SMALL FINGER;  Surgeon: Wynonia Sours, MD;  Location: [place];  Service: Orthopedics;  Laterality: Left;  Marland Kitchen VAGINAL HYSTERECTOMY      FAMILY HISTORY: Family History  Problem Relation Age of Onset  . Colon cancer Neg Hx   . Esophageal cancer Neg Hx   . Rectal cancer Neg Hx   . Stomach  cancer Neg Hx     SOCIAL HISTORY: Social History   Social History  . Marital status: Married    Spouse name: [name]   . Number of children: 5  . Years of education: College   Occupational History  .      retired   Social History Main Topics  . Smoking status: Never Smoker  . Smokeless tobacco: Never Used  . Alcohol use 0.0 oz/week  Comment: occasional, 03/17/15 infrequently  . Drug use: No  . Sexual activity: Not on file   Other Topics Concern  . Not on file   Social History Narrative   Patient is retired and married. [name])   Patient has some college education.    Patient has 5 children.    Right handed   Caffeine two cups of caffeine daily ( coffee)     PHYSICAL EXAM  Vitals:   03/16/16 1306  BP: 132/60  Pulse: 68  Resp: 14  Weight: 124 lb (56.2 kg)  Height: 5' (1.524 m)   Body mass index is 24.22 kg/m.  Generalized: Well developed, in no acute distress   Neurological examination   Mentation: Alert oriented to time, place, history taking. Follows all commands speech and language fluent  Cranial nerve -  Pupils were equal round reactive to light extraocular movements were full, visual field were full on confrontational test. Facial sensation and strength were normal. hearing was intact to finger rubbing bilaterally. Uvula and tongue moved midline.  head turning and shoulder shrug were symmetric.Tongue protrusion into cheek was normal.  Motor:Normal bulk and tone, full strength in the BUE, BLE, fine finger movements normal, no pronator drift. No focal weakness in the upper extremities.  Sensory: normal and symmetric to light touch, pinprick, and  vibration  Coordination: finger-nose-finger, neither ataxia, not tremor, nor dysmetria Reflexes: 1+ upper lower and symmetric Gait and Station: Rising up from seated position without assistance, normal stance.   DIAGNOSTIC DATA (LABS, IMAGING, TESTING)  Reviewed Dr. Maretta Los notes. Dr. Keane Police notes.     ASSESSMENT AND PLAN  75 y.o. year old female with acute onset of restless legs after first hip surgery 9 years ago. Since than moderate relief on Requip. Here to follow up twice  yearly .  She was switched to generic Requip XL by the pharmacy and it is not working. We discussed the Nupro patch.  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   With Alzheimer and other dementia patient , who she " guides and guards" in Adult Daycare.   Patient has more symptoms on  generic Requip XL, but due to costs remained on generic. Ropinirole.  Please call insurance company and inquire what meds are covered for restless leg syndrome. Neupro patch discussed was never tried , due to costs.  F/U q 12 months Dennie Bible, [facility]   Tyan Dy, MD   [facility] [address] [place], [place] [address] [phone]     Cc Dr Virgina Jock,

## 2016-03-16 NOTE — Patient Instructions (Signed)
Restless Legs Syndrome Introduction Restless legs syndrome is a condition that causes uncomfortable feelings or sensations in the legs, especially while sitting or lying down. The sensations usually cause an overwhelming urge to move the legs. The arms can also sometimes be affected. The condition can range from mild to severe. The symptoms often interfere with a person's ability to sleep. What are the causes? The cause of this condition is not known. What increases the risk? This condition is more likely to develop in:  People who are older than age 108.  Pregnant women. In general, restless legs syndrome is more common in women than in men.  People who have a family history of the condition.  People who have certain medical conditions, such as iron deficiency, kidney disease, Parkinson disease, or nerve damage.  People who take certain medicines, such as medicines for high blood pressure, nausea, colds, allergies, depression, and some heart conditions. What are the signs or symptoms? The main symptom of this condition is uncomfortable sensations in the legs. These sensations may be:  Described as pulling, tingling, prickling, throbbing, crawling, or burning.  Worse while you are sitting or lying down.  Worse during periods of rest or inactivity.  Worse at night, often interfering with your sleep.  Accompanied by a very strong urge to move your legs.  Temporarily relieved by movement of your legs. The sensations usually affect both sides of the body. The arms can also be affected, but this is rare. People who have this condition often have tiredness during the day because of their lack of sleep at night. How is this diagnosed? This condition may be diagnosed based on your description of the symptoms. You may also have tests, including blood tests, to check for other conditions that may lead to your symptoms. In some cases, you may be asked to spend some time in a sleep lab so your  sleeping can be monitored. How is this treated? Treatment for this condition is focused on managing the symptoms. Treatment may include:  Self-help and lifestyle changes.  Medicines. Follow these instructions at home:  Take medicines only as directed by your health care provider.  Try these methods to get temporary relief from the uncomfortable sensations:  Massage your legs.  Walk or stretch.  Take a cold or hot bath.  Practice good sleep habits. For example, go to bed and get up at the same time every day.  Exercise regularly.  Practice ways of relaxing, such as yoga or meditation.  Avoid caffeine and alcohol.  Do not use any tobacco products, including cigarettes, chewing tobacco, or electronic cigarettes. If you need help quitting, ask your health care provider.  Keep all follow-up visits as directed by your health care provider. This is important. Contact a health care provider if: Your symptoms do not improve with treatment, or they get worse. This information is not intended to replace advice given to you by your health care provider. Make sure you discuss any questions you have with your health care provider. Document Released: 03/19/2002 Document Revised: 09/04/2015 Document Reviewed: 03/25/2014  2017 Elsevier

## 2016-03-18 ENCOUNTER — Telehealth: Payer: Self-pay

## 2016-03-18 DIAGNOSIS — G2581 Restless legs syndrome: Secondary | ICD-10-CM

## 2016-03-18 MED ORDER — ROPINIROLE HCL 0.25 MG PO TABS: ORAL_TABLET | ORAL | 3 refills | Status: DC

## 2016-03-18 NOTE — Telephone Encounter (Signed)
Clarification of Rx requested via fax, new order sent in

## 2016-03-19 ENCOUNTER — Telehealth: Payer: Self-pay | Admitting: Neurology

## 2016-03-19 NOTE — Telephone Encounter (Signed)
Wells Guiles with [facility] on [address] is calling to verify directions for medication  rOPINIRole (REQUIP XL) 2 MG 24 hr tablet for the patient.  Wells Guiles can be reached at [phone].

## 2016-03-19 NOTE — Telephone Encounter (Signed)
I confirmed that the patient takes immediate release Ropinorol in  afternoon and XR at night time.

## 2016-05-05 DIAGNOSIS — M65322 Trigger finger, left index finger: Secondary | ICD-10-CM | POA: Diagnosis not present

## 2016-05-05 DIAGNOSIS — M180 Bilateral primary osteoarthritis of first carpometacarpal joints: Secondary | ICD-10-CM | POA: Diagnosis not present

## 2016-05-05 DIAGNOSIS — G5603 Carpal tunnel syndrome, bilateral upper limbs: Secondary | ICD-10-CM | POA: Diagnosis not present

## 2016-05-07 ENCOUNTER — Other Ambulatory Visit: Payer: Self-pay | Admitting: Orthopedic Surgery

## 2016-05-12 ENCOUNTER — Encounter (HOSPITAL_BASED_OUTPATIENT_CLINIC_OR_DEPARTMENT_OTHER): Payer: Self-pay | Admitting: *Deleted

## 2016-05-17 ENCOUNTER — Encounter (HOSPITAL_BASED_OUTPATIENT_CLINIC_OR_DEPARTMENT_OTHER)
Admission: RE | Admit: 2016-05-17 | Discharge: 2016-05-17 | Disposition: A | Discharge status: Home / Self Care | Payer: PPO | Source: Ambulatory Visit | Attending: Orthopedic Surgery | Admitting: Orthopedic Surgery

## 2016-05-17 DIAGNOSIS — Z96643 Presence of artificial hip joint, bilateral: Secondary | ICD-10-CM | POA: Diagnosis not present

## 2016-05-17 DIAGNOSIS — Z882 Allergy status to sulfonamides status: Secondary | ICD-10-CM | POA: Diagnosis not present

## 2016-05-17 DIAGNOSIS — M65322 Trigger finger, left index finger: Secondary | ICD-10-CM | POA: Diagnosis not present

## 2016-05-17 DIAGNOSIS — M180 Bilateral primary osteoarthritis of first carpometacarpal joints: Secondary | ICD-10-CM | POA: Diagnosis not present

## 2016-05-17 DIAGNOSIS — Z7982 Long term (current) use of aspirin: Secondary | ICD-10-CM | POA: Diagnosis not present

## 2016-05-17 DIAGNOSIS — Z79899 Other long term (current) drug therapy: Secondary | ICD-10-CM | POA: Diagnosis not present

## 2016-05-17 DIAGNOSIS — Z888 Allergy status to other drugs, medicaments and biological substances status: Secondary | ICD-10-CM | POA: Diagnosis not present

## 2016-05-17 DIAGNOSIS — I10 Essential (primary) hypertension: Secondary | ICD-10-CM | POA: Diagnosis not present

## 2016-05-17 DIAGNOSIS — G5603 Carpal tunnel syndrome, bilateral upper limbs: Secondary | ICD-10-CM | POA: Diagnosis not present

## 2016-05-17 DIAGNOSIS — D649 Anemia, unspecified: Secondary | ICD-10-CM | POA: Diagnosis not present

## 2016-05-17 DIAGNOSIS — K219 Gastro-esophageal reflux disease without esophagitis: Secondary | ICD-10-CM | POA: Diagnosis not present

## 2016-05-17 LAB — BASIC METABOLIC PANEL
Anion gap: 8 (ref 5–15)
BUN: 37 mg/dL — AB (ref 6–20)
CHLORIDE: 103 mmol/L (ref 101–111)
CO2: 26 mmol/L (ref 22–32)
CREATININE: 1.57 mg/dL — AB (ref 0.44–1.00)
Calcium: 9.4 mg/dL (ref 8.9–10.3)
GFR calc Af Amer: 36 mL/min — ABNORMAL LOW (ref >60)
GFR calc non Af Amer: 31 mL/min — ABNORMAL LOW (ref >60)
GLUCOSE: 97 mg/dL (ref 65–99)
Potassium: 4.5 mmol/L (ref 3.5–5.1)
Sodium: 137 mmol/L (ref 135–145)

## 2016-05-17 NOTE — Progress Notes (Signed)
EKG and labs reviewed by Dr. Smith Robert, will proceed with surgery as scheduled.

## 2016-05-18 ENCOUNTER — Ambulatory Visit (HOSPITAL_BASED_OUTPATIENT_CLINIC_OR_DEPARTMENT_OTHER)
Admission: RE | Admit: 2016-05-18 | Discharge: 2016-05-18 | Disposition: A | Discharge status: Home / Self Care | Payer: PPO | Source: Ambulatory Visit | Attending: Orthopedic Surgery | Admitting: Orthopedic Surgery

## 2016-05-18 ENCOUNTER — Ambulatory Visit (HOSPITAL_BASED_OUTPATIENT_CLINIC_OR_DEPARTMENT_OTHER): Payer: PPO | Admitting: Anesthesiology

## 2016-05-18 ENCOUNTER — Encounter (HOSPITAL_BASED_OUTPATIENT_CLINIC_OR_DEPARTMENT_OTHER)
Admission: RE | Disposition: A | Discharge status: Home / Self Care | Payer: Self-pay | Source: Ambulatory Visit | Attending: Orthopedic Surgery

## 2016-05-18 ENCOUNTER — Encounter (HOSPITAL_BASED_OUTPATIENT_CLINIC_OR_DEPARTMENT_OTHER): Payer: Self-pay | Admitting: *Deleted

## 2016-05-18 DIAGNOSIS — Z882 Allergy status to sulfonamides status: Secondary | ICD-10-CM | POA: Diagnosis not present

## 2016-05-18 DIAGNOSIS — E871 Hypo-osmolality and hyponatremia: Secondary | ICD-10-CM | POA: Diagnosis not present

## 2016-05-18 DIAGNOSIS — K219 Gastro-esophageal reflux disease without esophagitis: Secondary | ICD-10-CM | POA: Diagnosis not present

## 2016-05-18 DIAGNOSIS — G5602 Carpal tunnel syndrome, left upper limb: Secondary | ICD-10-CM | POA: Diagnosis not present

## 2016-05-18 DIAGNOSIS — D509 Iron deficiency anemia, unspecified: Secondary | ICD-10-CM | POA: Diagnosis not present

## 2016-05-18 DIAGNOSIS — Z888 Allergy status to other drugs, medicaments and biological substances status: Secondary | ICD-10-CM | POA: Insufficient documentation

## 2016-05-18 DIAGNOSIS — Z96643 Presence of artificial hip joint, bilateral: Secondary | ICD-10-CM | POA: Insufficient documentation

## 2016-05-18 DIAGNOSIS — D649 Anemia, unspecified: Secondary | ICD-10-CM | POA: Insufficient documentation

## 2016-05-18 DIAGNOSIS — I10 Essential (primary) hypertension: Secondary | ICD-10-CM | POA: Diagnosis not present

## 2016-05-18 DIAGNOSIS — Z79899 Other long term (current) drug therapy: Secondary | ICD-10-CM | POA: Insufficient documentation

## 2016-05-18 DIAGNOSIS — Z7982 Long term (current) use of aspirin: Secondary | ICD-10-CM | POA: Diagnosis not present

## 2016-05-18 DIAGNOSIS — M180 Bilateral primary osteoarthritis of first carpometacarpal joints: Secondary | ICD-10-CM | POA: Diagnosis not present

## 2016-05-18 DIAGNOSIS — M65322 Trigger finger, left index finger: Secondary | ICD-10-CM | POA: Diagnosis not present

## 2016-05-18 DIAGNOSIS — G5603 Carpal tunnel syndrome, bilateral upper limbs: Secondary | ICD-10-CM | POA: Diagnosis not present

## 2016-05-18 DIAGNOSIS — M65312 Trigger thumb, left thumb: Secondary | ICD-10-CM | POA: Diagnosis not present

## 2016-05-18 HISTORY — PX: TRIGGER FINGER RELEASE: SHX641

## 2016-05-18 HISTORY — PX: CARPAL TUNNEL RELEASE: SHX101

## 2016-05-18 LAB — POCT HEMOGLOBIN-HEMACUE: Hemoglobin: 13.9 g/dL (ref 12.0–15.0)

## 2016-05-18 SURGERY — CARPAL TUNNEL RELEASE
Anesthesia: Monitor Anesthesia Care | Site: Wrist | Laterality: Left

## 2016-05-18 MED ORDER — LIDOCAINE HCL (PF) 0.5 % IJ SOLN
INTRAMUSCULAR | Status: DC | PRN
  Administered 2016-05-18: 30 mL via INTRAVENOUS

## 2016-05-18 MED ORDER — BUPIVACAINE HCL (PF) 0.25 % IJ SOLN
INTRAMUSCULAR | Status: DC | PRN
Start: 2016-05-18 — End: 2016-05-18
  Administered 2016-05-18: 9 mL

## 2016-05-18 MED ORDER — FENTANYL CITRATE (PF) 100 MCG/2ML IJ SOLN
INTRAMUSCULAR | Status: AC
  Filled 2016-05-18: qty 2

## 2016-05-18 MED ORDER — ONDANSETRON HCL 4 MG/2ML IJ SOLN
INTRAMUSCULAR | Status: DC | PRN
  Administered 2016-05-18: 4 mg via INTRAVENOUS

## 2016-05-18 MED ORDER — FENTANYL CITRATE (PF) 100 MCG/2ML IJ SOLN: 25.0000 ug | INTRAMUSCULAR | Status: DC | PRN

## 2016-05-18 MED ORDER — 0.9 % SODIUM CHLORIDE (POUR BTL) OPTIME
TOPICAL | Status: DC | PRN
  Administered 2016-05-18: 120 mL

## 2016-05-18 MED ORDER — OXYCODONE HCL 5 MG PO TABS: 5.0000 mg | ORAL_TABLET | Freq: Once | ORAL | Status: DC | PRN

## 2016-05-18 MED ORDER — LACTATED RINGERS IV SOLN: INTRAVENOUS | Status: DC

## 2016-05-18 MED ORDER — CEFAZOLIN SODIUM-DEXTROSE 2-4 GM/100ML-% IV SOLN
INTRAVENOUS | Status: AC
  Filled 2016-05-18: qty 100

## 2016-05-18 MED ORDER — FENTANYL CITRATE (PF) 100 MCG/2ML IJ SOLN: 50.0000 ug | INTRAMUSCULAR | Status: DC | PRN

## 2016-05-18 MED ORDER — CHLORHEXIDINE GLUCONATE 4 % EX LIQD: 60.0000 mL | Freq: Once | CUTANEOUS | Status: DC

## 2016-05-18 MED ORDER — OXYCODONE HCL 5 MG/5ML PO SOLN: 5.0000 mg | Freq: Once | ORAL | Status: DC | PRN

## 2016-05-18 MED ORDER — OXYCODONE-ACETAMINOPHEN 5-325 MG PO TABS: 1.0000 | ORAL_TABLET | ORAL | 0 refills | Status: DC | PRN

## 2016-05-18 MED ORDER — SCOPOLAMINE 1 MG/3DAYS TD PT72: 1.0000 | MEDICATED_PATCH | Freq: Once | TRANSDERMAL | Status: DC | PRN

## 2016-05-18 MED ORDER — MIDAZOLAM HCL 2 MG/2ML IJ SOLN: 1.0000 mg | INTRAMUSCULAR | Status: DC | PRN

## 2016-05-18 MED ORDER — FENTANYL CITRATE (PF) 100 MCG/2ML IJ SOLN
INTRAMUSCULAR | Status: DC | PRN
  Administered 2016-05-18: 100 ug via INTRAVENOUS

## 2016-05-18 MED ORDER — MIDAZOLAM HCL 2 MG/2ML IJ SOLN
INTRAMUSCULAR | Status: AC
  Filled 2016-05-18: qty 2

## 2016-05-18 MED ORDER — CEFAZOLIN SODIUM-DEXTROSE 2-4 GM/100ML-% IV SOLN: 2.0000 g | INTRAVENOUS | Status: DC

## 2016-05-18 MED ORDER — PROPOFOL 10 MG/ML IV BOLUS
INTRAVENOUS | Status: DC | PRN
Start: 2016-05-18 — End: 2016-05-18
  Administered 2016-05-18 (×2): 10 mg via INTRAVENOUS

## 2016-05-18 MED ORDER — MIDAZOLAM HCL 5 MG/5ML IJ SOLN
INTRAMUSCULAR | Status: DC | PRN
  Administered 2016-05-18: 2 mg via INTRAVENOUS

## 2016-05-18 MED ORDER — ONDANSETRON HCL 4 MG/2ML IJ SOLN: 4.0000 mg | Freq: Four times a day (QID) | INTRAMUSCULAR | Status: DC | PRN

## 2016-05-18 MED ORDER — LACTATED RINGERS IV SOLN
INTRAVENOUS | Status: DC | PRN
  Administered 2016-05-18: 10:00:00 via INTRAVENOUS

## 2016-05-18 SURGICAL SUPPLY — 38 items
BANDAGE COBAN STERILE 2 (GAUZE/BANDAGES/DRESSINGS) ×4 IMPLANT
BLADE SURG 15 STRL LF DISP TIS (BLADE) ×2 IMPLANT
BLADE SURG 15 STRL SS (BLADE) ×4
BNDG CMPR 9X4 STRL LF SNTH (GAUZE/BANDAGES/DRESSINGS) ×2
BNDG COHESIVE 3X5 TAN STRL LF (GAUZE/BANDAGES/DRESSINGS) ×4 IMPLANT
BNDG ESMARK 4X9 LF (GAUZE/BANDAGES/DRESSINGS) ×2 IMPLANT
BNDG GAUZE ELAST 4 BULKY (GAUZE/BANDAGES/DRESSINGS) ×4 IMPLANT
CHLORAPREP W/TINT 26ML (MISCELLANEOUS) ×4 IMPLANT
CORDS BIPOLAR (ELECTRODE) ×4 IMPLANT
COVER BACK TABLE 60X90IN (DRAPES) ×4 IMPLANT
COVER MAYO STAND STRL (DRAPES) ×4 IMPLANT
CUFF TOURNIQUET SINGLE 18IN (TOURNIQUET CUFF) ×4 IMPLANT
DECANTER SPIKE VIAL GLASS SM (MISCELLANEOUS) IMPLANT
DRAPE EXTREMITY T 121X128X90 (DRAPE) ×4 IMPLANT
DRAPE SURG 17X23 STRL (DRAPES) ×4 IMPLANT
DRSG PAD ABDOMINAL 8X10 ST (GAUZE/BANDAGES/DRESSINGS) ×4 IMPLANT
GAUZE SPONGE 4X4 12PLY STRL (GAUZE/BANDAGES/DRESSINGS) ×4 IMPLANT
GAUZE XEROFORM 1X8 LF (GAUZE/BANDAGES/DRESSINGS) ×4 IMPLANT
GLOVE BIOGEL PI IND STRL 7.0 (GLOVE) IMPLANT
GLOVE BIOGEL PI IND STRL 8.5 (GLOVE) ×2 IMPLANT
GLOVE BIOGEL PI INDICATOR 7.0 (GLOVE) ×4
GLOVE BIOGEL PI INDICATOR 8.5 (GLOVE) ×2
GLOVE ECLIPSE 6.5 STRL STRAW (GLOVE) ×2 IMPLANT
GLOVE SURG ORTHO 8.0 STRL STRW (GLOVE) ×4 IMPLANT
GOWN STRL REUS W/ TWL LRG LVL3 (GOWN DISPOSABLE) ×2 IMPLANT
GOWN STRL REUS W/TWL LRG LVL3 (GOWN DISPOSABLE) ×4
GOWN STRL REUS W/TWL XL LVL3 (GOWN DISPOSABLE) ×4 IMPLANT
NDL PRECISIONGLIDE 27X1.5 (NEEDLE) ×2 IMPLANT
NEEDLE PRECISIONGLIDE 27X1.5 (NEEDLE) ×4 IMPLANT
NS IRRIG 1000ML POUR BTL (IV SOLUTION) ×4 IMPLANT
PACK BASIN DAY SURGERY FS (CUSTOM PROCEDURE TRAY) ×4 IMPLANT
STOCKINETTE 4X48 STRL (DRAPES) ×4 IMPLANT
SUT ETHILON 4 0 PS 2 18 (SUTURE) ×4 IMPLANT
SUT VICRYL 4-0 PS2 18IN ABS (SUTURE) IMPLANT
SYR BULB 3OZ (MISCELLANEOUS) ×4 IMPLANT
SYR CONTROL 10ML LL (SYRINGE) ×4 IMPLANT
TOWEL OR 17X24 6PK STRL BLUE (TOWEL DISPOSABLE) ×8 IMPLANT
UNDERPAD 30X30 (UNDERPADS AND DIAPERS) ×4 IMPLANT

## 2016-05-18 NOTE — H&P (Addendum)
Nancy Baxter is an 76 y.o. female.   Chief Complaint: numbness hands HPI: Nancy Baxter is a 76 year old right-hand-dominant female who comes in with a complaint of numbness tingling both hands left greater than right along with catching of her left index finger. She states this particularly bad at night thumb to ring fingers. This been going approximately 1 year. She is waking 7 out of 7 nights. She has seen Dr. nickel for her neck. She has no history of injury to the hand or to the neck. She had nerve conductions done a year ago which are reviewed revealing carpal tunnel syndrome bilaterally. He has been wearing a brace which has given her some relief. She states using a hair dryer cause increased symptoms. She is not able to tolerate nonsteroidal correction steroids. She was also cannot take codeine. She has not taken any other medicine for this. She states the brace has helped. She has no history of diabetes thyroid problems she does have a history of arthritis. She has no history of gout peer family history is positive for each of these. She has been tested for diabetes. Her nerve conduction shows significant carpal tunnel syndrome bilaterally with both decreased amplitude and prolonged motor component delays.      Past Medical History:  Diagnosis Date  . Anemia   . Arthritis   . Complication of anesthesia   . GERD (gastroesophageal reflux disease)   . Heart murmur    evaluated 19yrago-no cardiac follow up needed  . Hypertension   . PONV (postoperative nausea and vomiting)     Past Surgical History:  Procedure Laterality Date  . BACK SURGERY  1961   spinal fusion age   . EAR CYST EXCISION  02/23/2011  . FOOT ARTHRODESIS  5/12   hammer toes,bunionectomy  . JOINT REPLACEMENT  2006   rt hip  . JOINT REPLACEMENT  2008   lt hip  . TONSILLECTOMY     age 76 . TOTAL HIP REVISION  01/05/2012   Procedure: TOTAL HIP REVISION;  Surgeon: FGearlean Alf MD;  Location: [facility];  Service:  Orthopedics;  Laterality: Left;  . TRIGGER FINGER RELEASE  02/23/2011   Procedure: RELEASE TRIGGER FINGER/A-1 PULLEY;  Surgeon: GWynonia Sours MD;  [facility]  Service: Orthopedics;  Laterality: Right;  . TRIGGER FINGER RELEASE Left 07/26/2012   Procedure: RELEASE TRIGGER FINGER/A-1 PULLEY LEFT SMALL FINGER;  Surgeon: GWynonia Sours MD;  [facility]  Service: Orthopedics;  Laterality: Left;  .Marland KitchenVAGINAL HYSTERECTOMY      Family History  Problem Relation Age of Onset  . Colon cancer Neg Hx   . Esophageal cancer Neg Hx   . Rectal cancer Neg Hx   . Stomach cancer Neg Hx    Social History:  reports that she has never smoked. She has never used smokeless tobacco. She reports that she drinks alcohol. She reports that she does not use drugs.  Allergies:  Allergies  Allergen Reactions  . Sulfa Antibiotics Hives  . Codeine Nausea And Vomiting  . Prednisone Other (See Comments)    Shuts down adrenals  . Zolpidem Tartrate Other (See Comments)    sleepwalks  . Adhesive [Tape] Rash    blisters    No prescriptions prior to admission.    Results for orders placed or performed during the hospital encounter of 05/18/16 (from the past 48 hour(s))  Basic metabolic panel     Status: Abnormal  Collection Time: 05/17/16 11:30 AM  Result Value Ref Range   Sodium 137 135 - 145 mmol/L   Potassium 4.5 3.5 - 5.1 mmol/L   Chloride 103 101 - 111 mmol/L   CO2 26 22 - 32 mmol/L   Glucose, Bld 97 65 - 99 mg/dL   BUN 37 (H) 6 - 20 mg/dL   Creatinine, Ser 1.57 (H) 0.44 - 1.00 mg/dL   Calcium 9.4 8.9 - 10.3 mg/dL   GFR calc non Af Amer 31 (L) >60 mL/min   GFR calc Af Amer 36 (L) >60 mL/min    Comment: (NOTE) The eGFR has been calculated using the CKD EPI equation. This calculation has not been validated in all clinical situations. eGFR's persistently <60 mL/min signify possible Chronic Kidney Disease.    Anion gap 8 5 - 15    No results  found.   Pertinent items are noted in HPI.  Height '4\' 11"'$  (1.499 m), weight 55.8 kg (123 lb).  General appearance: alert, cooperative and appears stated age Head: Normocephalic, without obvious abnormality Neck: no JVD Resp: clear to auscultation bilaterally Cardio: regular rate and rhythm, S1, S2 normal, no murmur, click, rub or gallop GI: soft, non-tender; bowel sounds normal; no masses,  no organomegaly Extremities: numbness hands catching left index finger Pulses: 2+ and symmetric Skin: Skin color, texture, turgor normal. No rashes or lesions Neurologic: Grossly normal Incision/Wound: na  Assessment/Plan  Assessment:  1. Bilateral carpal tunnel syndrome  2. Primary osteoarthritis of both first carpometacarpal joints  3. Trigger index finger of left hand    Plan: We have discussed possibly surgical decompression of the nerve. Would not recommend doing anything the carpal metacarpal joints of her thumbs. These are not terribly symptomatic for her. Pre-peri-and postoperative course are discussed along with risk complications. She is desirous proceeding to have the left side done. She is scheduled for decompression median nerve left wrist around release A1 pulley left index finger as an outpatient. She is aware that there is no guarantee to the surgery the possibility of infection recurrence injury to arteries nerves tendons incomplete release symptoms and dystrophy.      Tacie Mccuistion R 05/18/2016, 9:11 AM

## 2016-05-18 NOTE — Transfer of Care (Signed)
Immediate Anesthesia Transfer of Care Note  Patient: Nancy Baxter  Procedure(s) Performed: Procedure(s): LEFT CARPAL TUNNEL RELEASE (Left) RELEASE TRIGGER FINGER/A-1 PULLEY LEFT INDEX (Left)  Patient Location: PACU  Anesthesia Type:MAC and Bier block  Level of Consciousness: awake, alert  and oriented  Airway & Oxygen Therapy: Patient Spontanous Breathing  Post-op Assessment: Report given to RN and Post -op Vital signs reviewed and stable  Post vital signs: Reviewed and stable  Last Vitals:  Vitals:   05/18/16 1025 05/18/16 1134  BP: 123/64 122/76  Pulse:  76  Resp:  14  Temp:  36.3 C    Last Pain:  Vitals:   05/18/16 1015  TempSrc: Oral         Complications: No apparent anesthesia complications

## 2016-05-18 NOTE — Discharge Instructions (Addendum)

## 2016-05-18 NOTE — Op Note (Signed)
NAME:  Nancy Baxter, Nancy Baxter NO.:  [id number]  MEDICAL RECORD NO.:  [id number]  LOCATION:                                 FACILITY:  PHYSICIAN:  Daryll Brod, M.D.       DATE OF BIRTH:  26-Sep-1940  DATE OF PROCEDURE:  05/18/2016 DATE OF DISCHARGE:                              OPERATIVE REPORT   PREOPERATIVE DIAGNOSIS:  Carpal tunnel syndrome, left hand; stenosing tenosynovitis, left index finger.  POSTOPERATIVE DIAGNOSIS:  Carpal tunnel syndrome, left hand; stenosing tenosynovitis, left index finger.  OPERATION:  Release A1 pulley, left index finger with excision of flexor sheath cyst and release of median nerve, right wrist.  SURGEON:  Daryll Brod, MD.  ANESTHESIA:  Forearm-based IV regional with local infiltration.  PLACE OF SURGERY:  [facility]  ANESTHESIOLOGIST:  Albertha Ghee, MD.  HISTORY:  The patient is a 76 year old female with a history of carpal tunnel syndrome.  EMG nerve conduction is positive for the left hand along with trigger of her left index finger.  She has elected to undergo surgical release of each of the 2 structures with failure of conservative treatment.  Pre, peri, and postoperative course have been discussed along with risks and complications.  She is aware that there is no guarantee to the surgery; the possibility of infection; recurrence of injury to arteries, nerves, tendons; incomplete relief of symptoms and dystrophy.  In preoperative area, the patient is seen, the extremity marked by both patient and surgeon.  Antibiotic given.  PROCEDURE IN DETAIL:  The patient was brought to the operating room, where a forearm-based IV regional anesthetic was carried out without difficulty.  She was prepped using ChloraPrep in a supine position with the left arm free.  A 3-minute dry time was allowed and a time-out taken, confirming the patient and procedure.  The index finger was attended to first.  An oblique incision was made  over the A1 pulley of the left index finger metacarpophalangeal joint area.  It was carried down through subcutaneous tissue.  Bleeders were electrocauterized with bipolar.  Retractors were placed protecting neurovascular bundles radially and ulnarly.  The A1 pulley was identified.  A cyst was present on the volar aspect, this was excised.  An incision was then made on the radial side to the A1 pulley.  A small incision was made centrally in A2.  The tenosynovial tissue was markedly thickened.  Portions of this were removed.  The 2 tendons were then separated to remove any adhesions.  The finger was placed through full passive motion to no further triggering was noted.  The wound was irrigated.  The skin was closed with interrupted 4-0 nylon sutures.  A separate incision was then made over the mid palm.  This was longitudinal in nature.  This was carried down through subcutaneous tissue.  Bleeders were again electrocauterized with bipolar.  The palmar fascia was split.  The superficial palmar arch was identified.  The flexor tendon to the ring and little finger was identified.  It was noted that the palmar arch was slightly enlarged.  Was not thrombosed.  Retractors were placed after identification of the flexor tendon to  the ring and small fingers protecting the radial side median nerve.  Retractors were placed on the ulnar side protecting the ulnar nerve.  The flexor retinaculum was then incised on its ulnar border with sharp dissection.  A right angle and Sewell retractors were placed between skin and forearm fascia.  Tissue dorsal to the flexor retinaculum and distal palmar fascia was then separated with blunt dissection, and the palmar fascia remainder and forearm fascia was then released with blunt scissors for approximately 2 cm proximal to the wrist crease under direct vision.  The canal was explored.  An area of compression to the nerve was apparent.  The motor branch entered  into muscle distally.  The wound was copiously irrigated with saline.  The skin was closed with interrupted 4-0 nylon sutures.  A local infiltration with 0.25% bupivacaine without epinephrine was given to each of the wounds.  A total of 8 mL was used.  A sterile compressive dressing with the fingers and thumb free was applied.  On deflation of the tourniquet, all fingers immediately pinked.  She was taken to the recovery room for observation in satisfactory condition.  She will be discharged to home to return to the [facility] in 1 week, on tramadol.    ______________________________ Daryll Brod, M.D.   ______________________________ Daryll Brod, M.D.    GK/MEDQ  D:  05/18/2016  T:  05/18/2016  Job:  [id number]

## 2016-05-18 NOTE — Brief Op Note (Signed)
05/18/2016  11:31 AM  PATIENT:  Wallene Dales  76 y.o. female  PRE-OPERATIVE DIAGNOSIS:  LEFT CARPAL TUNNEL SYNDROME, TRIGGER LEFT INDEX FINGER  POST-OPERATIVE DIAGNOSIS:  LEFT CARPAL TUNNEL SYNDROME, TRIGGER LEFT INDEX FINGER  PROCEDURE:  Procedure(s): LEFT CARPAL TUNNEL RELEASE (Left) RELEASE TRIGGER FINGER/A-1 PULLEY LEFT INDEX (Left)  SURGEON:  Surgeon(s) and Role:    * Daryll Brod, MD - Primary  PHYSICIAN ASSISTANT:   ASSISTANTS: none   ANESTHESIA:   regional and local  EBL:  Total I/O In: -  Out: 5 [Blood:5]  BLOOD ADMINISTERED:none  DRAINS: none   LOCAL MEDICATIONS USED:  BUPIVICAINE   SPECIMEN:  No Specimen  DISPOSITION OF SPECIMEN:  N/A  COUNTS:  YES  TOURNIQUET:   Total Tourniquet Time Documented: Forearm (Left) - 26 minutes Total: Forearm (Left) - 26 minutes   DICTATION: .Other Dictation: Dictation Number [id number]  PLAN OF CARE: Discharge to home after PACU  PATIENT DISPOSITION:  PACU - hemodynamically stable.

## 2016-05-18 NOTE — Anesthesia Postprocedure Evaluation (Signed)
Anesthesia Post Note  Patient: Nancy Baxter  Procedure(s) Performed: Procedure(s) (LRB): LEFT CARPAL TUNNEL RELEASE (Left) RELEASE TRIGGER FINGER/A-1 PULLEY LEFT INDEX (Left)  Patient location during evaluation: PACU Anesthesia Type: MAC Level of consciousness: awake and alert Pain management: pain level controlled Vital Signs Assessment: post-procedure vital signs reviewed and stable Respiratory status: spontaneous breathing, nonlabored ventilation, respiratory function stable and patient connected to nasal cannula oxygen Cardiovascular status: stable and blood pressure returned to baseline Anesthetic complications: no       Last Vitals:  Vitals:   05/18/16 1145 05/18/16 1215  BP: 121/88 (!) 146/67  Pulse: 70 69  Resp: 18 18  Temp:  36.6 C    Last Pain:  Vitals:   05/18/16 1215  TempSrc:   PainSc: 0-No pain                 Catalina Gravel

## 2016-05-18 NOTE — Op Note (Signed)
Dictation Number [id number]

## 2016-05-18 NOTE — Anesthesia Preprocedure Evaluation (Signed)
Anesthesia Evaluation  Patient identified by MRN, date of birth, ID band Patient awake    Reviewed: Allergy & Precautions, H&P , NPO status , Patient's Chart, lab work & pertinent test results  History of Anesthesia Complications (+) PONV and history of anesthetic complications  Airway Mallampati: II   Neck ROM: full    Dental   Pulmonary    breath sounds clear to auscultation       Cardiovascular hypertension,  Rhythm:regular Rate:Normal     Neuro/Psych  Neuromuscular disease    GI/Hepatic GERD  ,  Endo/Other    Renal/GU      Musculoskeletal  (+) Arthritis ,   Abdominal   Peds  Hematology   Anesthesia Other Findings   Reproductive/Obstetrics                             Anesthesia Physical Anesthesia Plan  ASA: II  Anesthesia Plan: MAC and Bier Block   Post-op Pain Management:    Induction: Intravenous  Airway Management Planned: Simple Face Mask  Additional Equipment:   Intra-op Plan:   Post-operative Plan:   Informed Consent: I have reviewed the patients History and Physical, chart, labs and discussed the procedure including the risks, benefits and alternatives for the proposed anesthesia with the patient or authorized representative who has indicated his/her understanding and acceptance.     Plan Discussed with: CRNA, Anesthesiologist and Surgeon  Anesthesia Plan Comments:         Anesthesia Quick Evaluation

## 2016-05-19 ENCOUNTER — Encounter (HOSPITAL_BASED_OUTPATIENT_CLINIC_OR_DEPARTMENT_OTHER): Payer: Self-pay | Admitting: Orthopedic Surgery

## 2016-06-22 DIAGNOSIS — J069 Acute upper respiratory infection, unspecified: Secondary | ICD-10-CM | POA: Diagnosis not present

## 2016-06-22 DIAGNOSIS — R05 Cough: Secondary | ICD-10-CM | POA: Diagnosis not present

## 2016-06-22 DIAGNOSIS — T2114XA Burn of first degree of lower back, initial encounter: Secondary | ICD-10-CM | POA: Diagnosis not present

## 2016-06-22 DIAGNOSIS — J029 Acute pharyngitis, unspecified: Secondary | ICD-10-CM | POA: Diagnosis not present

## 2016-06-22 DIAGNOSIS — Z6825 Body mass index (BMI) 25.0-25.9, adult: Secondary | ICD-10-CM | POA: Diagnosis not present

## 2016-07-07 ENCOUNTER — Encounter: Payer: Self-pay | Admitting: Internal Medicine

## 2016-07-26 DIAGNOSIS — N183 Chronic kidney disease, stage 3 (moderate): Secondary | ICD-10-CM | POA: Diagnosis not present

## 2016-07-26 DIAGNOSIS — M545 Low back pain: Secondary | ICD-10-CM | POA: Diagnosis not present

## 2016-07-26 DIAGNOSIS — G5601 Carpal tunnel syndrome, right upper limb: Secondary | ICD-10-CM | POA: Diagnosis not present

## 2016-07-26 DIAGNOSIS — M419 Scoliosis, unspecified: Secondary | ICD-10-CM | POA: Diagnosis not present

## 2016-07-26 DIAGNOSIS — Z6826 Body mass index (BMI) 26.0-26.9, adult: Secondary | ICD-10-CM | POA: Diagnosis not present

## 2016-07-26 DIAGNOSIS — I129 Hypertensive chronic kidney disease with stage 1 through stage 4 chronic kidney disease, or unspecified chronic kidney disease: Secondary | ICD-10-CM | POA: Diagnosis not present

## 2016-08-04 DIAGNOSIS — M4186 Other forms of scoliosis, lumbar region: Secondary | ICD-10-CM | POA: Diagnosis not present

## 2016-08-04 DIAGNOSIS — M4300 Spondylolysis, site unspecified: Secondary | ICD-10-CM | POA: Diagnosis not present

## 2016-08-04 DIAGNOSIS — M549 Dorsalgia, unspecified: Secondary | ICD-10-CM | POA: Diagnosis not present

## 2016-08-11 DIAGNOSIS — M4300 Spondylolysis, site unspecified: Secondary | ICD-10-CM | POA: Diagnosis not present

## 2016-08-11 DIAGNOSIS — M545 Low back pain: Secondary | ICD-10-CM | POA: Diagnosis not present

## 2016-08-25 DIAGNOSIS — M4186 Other forms of scoliosis, lumbar region: Secondary | ICD-10-CM | POA: Diagnosis not present

## 2016-08-25 DIAGNOSIS — Z6826 Body mass index (BMI) 26.0-26.9, adult: Secondary | ICD-10-CM | POA: Diagnosis not present

## 2016-08-25 DIAGNOSIS — I10 Essential (primary) hypertension: Secondary | ICD-10-CM | POA: Diagnosis not present

## 2016-08-25 DIAGNOSIS — M4300 Spondylolysis, site unspecified: Secondary | ICD-10-CM | POA: Diagnosis not present

## 2016-09-22 DIAGNOSIS — H524 Presbyopia: Secondary | ICD-10-CM | POA: Diagnosis not present

## 2016-09-22 DIAGNOSIS — H52223 Regular astigmatism, bilateral: Secondary | ICD-10-CM | POA: Diagnosis not present

## 2016-09-22 DIAGNOSIS — Z961 Presence of intraocular lens: Secondary | ICD-10-CM | POA: Diagnosis not present

## 2016-09-22 DIAGNOSIS — H43813 Vitreous degeneration, bilateral: Secondary | ICD-10-CM | POA: Diagnosis not present

## 2016-09-22 DIAGNOSIS — H16223 Keratoconjunctivitis sicca, not specified as Sjogren's, bilateral: Secondary | ICD-10-CM | POA: Diagnosis not present

## 2016-10-19 ENCOUNTER — Telehealth (INDEPENDENT_AMBULATORY_CARE_PROVIDER_SITE_OTHER): Payer: Self-pay | Admitting: Specialist

## 2016-10-19 NOTE — Telephone Encounter (Signed)
Pt came pick up handicap placard. Placard couldn't be located. Pt was informed that per Rachel Bo., CMA to Dr. Louanne Skye, that another one can be printed off and filled out when Dr. Louanne Skye gets back in the office next week. Pt states that placard is a 5 year placard that will expire soon. Please advise.

## 2016-10-27 NOTE — Telephone Encounter (Signed)
Dr. Louanne Skye has a form to sign off on

## 2016-10-28 NOTE — Telephone Encounter (Signed)
Pt is aware that form I sready for pick up

## 2016-11-16 ENCOUNTER — Ambulatory Visit: Payer: PPO | Attending: Neurosurgery | Admitting: Physical Therapy

## 2016-11-16 DIAGNOSIS — M6281 Muscle weakness (generalized): Secondary | ICD-10-CM

## 2016-11-16 DIAGNOSIS — G8929 Other chronic pain: Secondary | ICD-10-CM | POA: Diagnosis not present

## 2016-11-16 DIAGNOSIS — R293 Abnormal posture: Secondary | ICD-10-CM | POA: Diagnosis not present

## 2016-11-16 DIAGNOSIS — M545 Low back pain, unspecified: Secondary | ICD-10-CM

## 2016-11-16 NOTE — Therapy (Signed)
[facility] [address] [place], [place], [address] Phone: [phone]   Fax:  [phone]  Physical Therapy Evaluation  Patient Details  Name: Nancy Baxter MRN: [id number] Date of Birth: [birth year] Referring Provider: Dr. Kathyrn Sheriff  Encounter Date: 11/16/2016      PT End of Session - 11/16/16 1113    Visit Number 1   Date for PT Re-Evaluation 2017/02/01   Authorization Type G codes;  KX at visit 15   PT Start Time 1019   PT Stop Time 1100   PT Time Calculation (min) 41 min   Activity Tolerance Patient tolerated treatment well      Past Medical History:  Diagnosis Date  . Anemia   . Arthritis   . Complication of anesthesia   . GERD (gastroesophageal reflux disease)   . Heart murmur    evaluated 76yr ago-no cardiac follow up needed  . Hypertension   . PONV (postoperative nausea and vomiting)     Past Surgical History:  Procedure Laterality Date  . BACK SURGERY  1961   spinal fusion age   . CARPAL TUNNEL RELEASE Left 05/18/2016   Procedure: LEFT CARPAL TUNNEL RELEASE;  Surgeon: Daryll Brod, MD;  Location: [place];  Service: Orthopedics;  Laterality: Left;  . EAR CYST EXCISION  02/23/2011  . FOOT ARTHRODESIS  5/12   hammer toes,bunionectomy  . JOINT REPLACEMENT  2006   rt hip  . JOINT REPLACEMENT  2008   lt hip  . TONSILLECTOMY     age 11  . TOTAL HIP REVISION  01/05/2012   Procedure: TOTAL HIP REVISION;  Surgeon: Gearlean Alf, MD;  [facility];  Service: Orthopedics;  Laterality: Left;  . TRIGGER FINGER RELEASE  02/23/2011   Procedure: RELEASE TRIGGER FINGER/A-1 PULLEY;  Surgeon: Wynonia Sours, MD;  Location: [place];  Service: Orthopedics;  Laterality: Right;  . TRIGGER FINGER RELEASE Left 07/26/2012   Procedure: RELEASE TRIGGER FINGER/A-1 PULLEY LEFT SMALL FINGER;  Surgeon: Wynonia Sours, MD;  Location: [place];  Service: Orthopedics;   Laterality: Left;  . TRIGGER FINGER RELEASE Left 05/18/2016   Procedure: RELEASE TRIGGER FINGER/A-1 PULLEY LEFT INDEX;  Surgeon: Daryll Brod, MD;  Location: [place];  Service: Orthopedics;  Laterality: Left;  Marland Kitchen VAGINAL HYSTERECTOMY      There were no vitals filed for this visit.       Subjective Assessment - 11/16/16 1030    Subjective The doctor referred for scoliosis, stenosis and degenerative changes in her low back.  History of spinal fusion at age 41.  About 4 years ago, she fell and since then pain across low back.   After walking, I'm bent over and hurting.  I can't pick up anything heavy.   Pertinent History bil THR; left replaced twice; no osteopenia/osteoporosis;  bil CTS   Limitations House hold activities;Walking;Lifting   How long can you walk comfortably? 4-5 houses down the street   Diagnostic tests X-rays and MRI   Patient Stated Goals Get my ribs off my hips; get taller   Currently in Pain? Yes   Pain Score 0-No pain   Pain Location Back   Pain Orientation Lower;Right;Left   Pain Type Chronic pain   Pain Radiating Towards left > right;  numbness bil lateral thighs and right lower leg intermittently   Pain Onset More than a month ago   Pain Frequency Intermittent   Aggravating Factors  walking; lifting; pull weeds; lying down very painful initially;    Pain Relieving Factors sitting;  I"m taller in the morning            [facility] PT Assessment - 11/16/16 0001      Assessment   Medical Diagnosis lumbar spondylosis   Referring Provider Dr. Kathyrn Sheriff   Onset Date/Surgical Date --  > 6 months   Hand Dominance Right   Next MD Visit after PT   Prior Therapy 2 years ago patient stated she did well but didn't keep up with ex     Precautions   Precautions None     Restrictions   Weight Bearing Restrictions No     Balance Screen   Has the patient fallen in the past 6 months No   Has the patient had a decrease in activity level because of a fear  of falling?  No   Is the patient reluctant to leave their home because of a fear of falling?  No     Home Environment   Living Environment Private residence   Living Arrangements Spouse/significant other   Type of [place] to enter   Entrance Stairs-Number of Steps 5   Home Layout Two level     Prior [place] work   [employer]   Leisure sewing, travel to [employer]     Observation/Other Assessments   Focus on Therapeutic Outcomes (FOTO)  47% limitation     Posture/Postural Control   Posture/Postural Control Postural limitations   Postural Limitations Decreased lumbar lordosis  with sitting and prolonged stand increased thoracic kyphosis   Posture Comments right scoliosis  height 59 3/5 inches     AROM   Lumbar Flexion WFLs   Lumbar Extension 10   Lumbar - Right Side Bend 10   Lumbar - Left Side Bend 20     Strength   Right/Left Hip Right;Left   Right Hip ABduction 4-/5   Left Hip ABduction 3+/5   Lumbar Flexion 4-/5   Lumbar Extension 4-/5     Flexibility   Soft Tissue Assessment /Muscle Length yes   Hamstrings WFLs   Quadriceps decreased hip flexor lengths bil     Palpation   Palpation comment Negative tenderness     Bed Mobility   Bed Mobility --  pain with supine to sit and turning on table            Objective measurements completed on examination: See above findings.                  PT Education - 11/16/16 1113    Education provided Yes   Education Details clams   Person(s) Educated Patient   Methods Explanation;Demonstration;Handout   Comprehension Verbalized understanding;Returned demonstration          PT Short Term Goals - 11/16/16 1954      PT SHORT TERM GOAL #1   Title be independent in initial HEP   Time 4   Period Weeks   Status New   Target Date 12/14/16     PT SHORT TERM GOAL #2   Title Patient will be able to transfer in bed and turn in bed  with 25% less pain   Time 4   Period Weeks   Status New   Target Date 12/14/16     PT SHORT TERM GOAL #3   Title tolerate standing and walking for 10-15 minutes  to improve community activity   Time 4   Period Weeks   Status New   Target Date 12/14/16           PT Long Term Goals - 11/16/16 1957      PT LONG TERM GOAL #1   Title be independent in advanced HEP needed for further improvements in posture and strength   Time 8   Period Weeks   Status New   Target Date 01/11/17     PT LONG TERM GOAL #2   Title reduce FOTO to < or = to 41% limitation   Time 8   Period Weeks   Status New   Target Date 01/11/17     PT LONG TERM GOAL #3   Title stand and walk for 30 minutes with minimal limitation to allow for community activity   Time 8   Period Weeks   Status New   Target Date 01/11/17     PT LONG TERM GOAL #4   Title report a 50% reduction lumbar  pain with ADLs and volunteer tasks   Time 8   Period Weeks   Status New   Target Date 01/11/17     PT LONG TERM GOAL #5   Title The patient will have improved bilateral hip strength to grossly 4/5 needed for standing and walking longer periods of time   Time 8   Period Weeks   Status New   Target Date 01/11/17     Additional Long Term Goals   Additional Long Term Goals Yes     PT LONG TERM GOAL #6   Title Height improved from 59 3/4 inches to 60 1/4 inches   Time 8   Period Weeks   Status New   Target Date 01/11/17                Plan - 11/16/16 1114    Clinical Impression Statement The patient had a lumbar fusion many years ago and did well until 4 years ago when she fell.  Since that time she has had progressively worsened with pain across her low back with getting in bed,  turning in bed, walking, lifting and pulling weeds.  She reports that she is limited to walking only 4-5 houses down the stretch before she becomes hunched over with "my ribs touching my hips."  She has significant weakness in bil  hips especially left and weakness in core muscles.  She does have a long standing scoliosis and decreased lumbar lordosis.  She reports a loss in height but reports her bone density test was good.  She would benefit from PT for hip/lumbo/pelvic and postural strengthening.     History and Personal Factors relevant to plan of care: bil THR with left revision, remote lumbar fusion; bil CTS;  good home support   Clinical Presentation Stable   Clinical Presentation due to: lumbar spondylosis   Clinical Decision Making Low   Rehab Potential Good   PT Duration 8 weeks   PT Treatment/Interventions ADLs/Self Care Home Management;Cryotherapy;Electrical Stimulation;Moist Heat;Therapeutic activities;Therapeutic exercise;Neuromuscular re-education;Patient/family education;Manual techniques;Dry needling;Taping   PT Next Visit Plan gluteal strengthening (review clams and progress);  initiate abdominal brace series;  neutral spine stabilization;  postural strengthening;  modalities if needed      Patient will benefit from skilled therapeutic intervention in order to improve the following deficits and impairments:  Pain, Decreased strength, Postural dysfunction, Difficulty walking, Decreased activity tolerance  Visit Diagnosis: Chronic bilateral low back pain without  sciatica - Plan: PT plan of care cert/re-cert  Muscle weakness (generalized) - Plan: PT plan of care cert/re-cert  Abnormal posture - Plan: PT plan of care cert/re-cert      G-Codes - 00/37/04 Jul 26, 1999    Functional Assessment Tool Used (Outpatient Only) FOTO ; clinical judgement   Functional Limitation Mobility: Walking and moving around   Mobility: Walking and Moving Around Current Status ([id number]) At least 40 percent but less than 60 percent impaired, limited or restricted   Mobility: Walking and Moving Around Goal Status [phone]) At least 20 percent but less than 40 percent impaired, limited or restricted       Problem List Patient Active  Problem List   Diagnosis Date Noted  . Restless legs syndrome (RLS) 12/25/2012  . Lumbosacral root lesions, not elsewhere classified 12/25/2012  . Postop Transfusion 01/07/2012  . Postop Acute blood loss anemia 01/06/2012  . Postop Hyponatremia 01/06/2012  . Failed total hip arthroplasty ([place]) 01/05/2012   Ruben Im, PT 11/16/16 8:05 PM Phone: [phone] Fax: [phone]  Alvera Singh 11/16/2016, 8:04 PM  [facility] [address], [place], [place], [address] Phone: [phone]   Fax:  [phone]  Name: Nancy Baxter MRN: [id number] Date of Birth: 11-26-1940

## 2016-11-16 NOTE — Patient Instructions (Signed)
  Abduction: Clam (Eccentric) - Side-Lying   Lie on side with knees bent. Lift top knee, keeping feet together. Keep trunk steady. Slowly lower for 3-5 seconds. __10_ reps per set, _1__ sets per day, _7__ days per week.   Copyright  VHI. All rights reserved.    Ruben Im PT [facility] [address] [place], [place] [address] Phone # [phone] Fax [phone]

## 2016-11-17 ENCOUNTER — Ambulatory Visit: Payer: PPO

## 2016-11-17 DIAGNOSIS — M6281 Muscle weakness (generalized): Secondary | ICD-10-CM

## 2016-11-17 DIAGNOSIS — M545 Low back pain, unspecified: Secondary | ICD-10-CM

## 2016-11-17 DIAGNOSIS — R293 Abnormal posture: Secondary | ICD-10-CM

## 2016-11-17 DIAGNOSIS — G8929 Other chronic pain: Secondary | ICD-10-CM

## 2016-11-17 NOTE — Therapy (Signed)
[facility] [address], [place], [place], [address] Phone: [phone]   Fax:  [phone]  Physical Therapy Treatment  Patient Details  Name: Nancy Baxter MRN: [id number] Date of Birth: 1940/10/29 Referring Provider: Dr. Kathyrn Sheriff  Encounter Date: 11/17/2016      PT End of Session - 11/17/16 2044    Visit Number 2   Date for PT Re-Evaluation 2017-02-09   Authorization Type G codes;  KX at visit 15   PT Start Time 8101   PT Stop Time 1530   PT Time Calculation (min) 45 min   Activity Tolerance Patient tolerated treatment well      Past Medical History:  Diagnosis Date  . Anemia   . Arthritis   . Complication of anesthesia   . GERD (gastroesophageal reflux disease)   . Heart murmur    evaluated 60yr ago-no cardiac follow up needed  . Hypertension   . PONV (postoperative nausea and vomiting)     Past Surgical History:  Procedure Laterality Date  . BACK SURGERY  1961   spinal fusion age   . CARPAL TUNNEL RELEASE Left 05/18/2016   Procedure: LEFT CARPAL TUNNEL RELEASE;  Surgeon: Daryll Brod, MD;  Location: [place];  Service: Orthopedics;  Laterality: Left;  . EAR CYST EXCISION  02/23/2011  . FOOT ARTHRODESIS  5/12   hammer toes,bunionectomy  . JOINT REPLACEMENT  2006   rt hip  . JOINT REPLACEMENT  2008   lt hip  . TONSILLECTOMY     age 28  . TOTAL HIP REVISION  01/05/2012   Procedure: TOTAL HIP REVISION;  Surgeon: Gearlean Alf, MD;  [facility]: [facility];  Service: Orthopedics;  Laterality: Left;  . TRIGGER FINGER RELEASE  02/23/2011   Procedure: RELEASE TRIGGER FINGER/A-1 PULLEY;  Surgeon: Wynonia Sours, MD;  Location: [place];  Service: Orthopedics;  Laterality: Right;  . TRIGGER FINGER RELEASE Left 07/26/2012   Procedure: RELEASE TRIGGER FINGER/A-1 PULLEY LEFT SMALL FINGER;  Surgeon: Wynonia Sours, MD;  Location: [place];  Service: Orthopedics;   Laterality: Left;  . TRIGGER FINGER RELEASE Left 05/18/2016   Procedure: RELEASE TRIGGER FINGER/A-1 PULLEY LEFT INDEX;  Surgeon: Daryll Brod, MD;  Location: [place];  Service: Orthopedics;  Laterality: Left;  Marland Kitchen VAGINAL HYSTERECTOMY      There were no vitals filed for this visit.      Subjective Assessment - 11/17/16 2037    Subjective Doing well. No issues with initial HEP.    Patient Stated Goals Get my ribs off my hips; get taller   Currently in Pain? Yes   Pain Score 4    Pain Location Back   Pain Orientation Lower;Right;Left   Pain Descriptors / Indicators Aching   Pain Type Chronic pain   Pain Radiating Towards none   Pain Onset More than a month ago   Pain Frequency Intermittent   Aggravating Factors  walking; lifting    Pain Relieving Factors sitting   Multiple Pain Sites No                         [facility] Adult PT Treatment/Exercise - 11/17/16 2040      Lumbar Exercises: Aerobic   Stationary Bike NuStep: lvl 2, 6 min      Lumbar Exercises: Supine   Ab Set 10 reps;5 seconds   Clam 10 reps;3 seconds   Clam Limitations yellow  TB at knees    Bent Knee Raise 10 reps;3 seconds   Bent Knee Raise Limitations yellow TB at knees    Bridge 10 reps;3 seconds   Bridge Limitations Yellow TB at knees    Other Supine Lumbar Exercises Hooklying adduction ball squeeze 5" x 10 reps     Lumbar Exercises: Sidelying   Clam 15 reps;3 seconds   Clam Limitations B sidelying without resistance     Knee/Hip Exercises: Standing   Heel Raises Both;15 reps;3 seconds   Hip Abduction Right;Left;10 reps;Knee straight   Abduction Limitations at counter    Hip Extension Right;Left;10 reps;Knee straight   Extension Limitations at counter                   PT Short Term Goals - 11/17/16 2045      PT SHORT TERM GOAL #1   Title be independent in initial HEP   Time 4   Period Weeks   Status On-going     PT SHORT TERM GOAL #2   Title Patient will  be able to transfer in bed and turn in bed with 25% less pain   Time 4   Period Weeks   Status On-going     PT SHORT TERM GOAL #3   Title tolerate standing and walking for 10-15 minutes to improve community activity   Time 4   Period Weeks   Status On-going           PT Long Term Goals - 11/17/16 2045      PT LONG TERM GOAL #1   Title be independent in advanced HEP needed for further improvements in posture and strength   Time 8   Period Weeks   Status On-going     PT LONG TERM GOAL #2   Title reduce FOTO to < or = to 41% limitation   Time 8   Period Weeks   Status On-going     PT LONG TERM GOAL #3   Title stand and walk for 30 minutes with minimal limitation to allow for community activity   Time 8   Period Weeks   Status On-going     PT LONG TERM GOAL #4   Title report a 50% reduction lumbar  pain with ADLs and volunteer tasks   Time 8   Period Weeks   Status On-going     PT LONG TERM GOAL #5   Title The patient will have improved bilateral hip strength to grossly 4/5 needed for standing and walking longer periods of time   Time 8   Period Weeks   Status On-going     PT LONG TERM GOAL #6   Title Height improved from 59 3/4 inches to 60 1/4 inches   Time 8   Period Weeks   Status On-going               Plan - 11/17/16 2046    Clinical Impression Statement Doing well today.  Good technique with HEP demo.  Initiated abdominal progression and basic hip strengthening without issue today.  Will progress as pt. is able in coming visits.     PT Treatment/Interventions ADLs/Self Care Home Management;Cryotherapy;Electrical Stimulation;Moist Heat;Therapeutic activities;Therapeutic exercise;Neuromuscular re-education;Patient/family education;Manual techniques;Dry needling;Taping   PT Next Visit Plan gluteal strengthening Progress abdominal brace series;  neutral spine stabilization;  postural strengthening;  modalities if needed      Patient will benefit  from skilled therapeutic intervention in order to improve the following deficits  and impairments:  Pain, Decreased strength, Postural dysfunction, Difficulty walking, Decreased activity tolerance  Visit Diagnosis: Chronic bilateral low back pain without sciatica  Muscle weakness (generalized)  Abnormal posture       G-Codes - December 09, 2016 2001    Functional Assessment Tool Used (Outpatient Only) FOTO ; clinical judgement   Functional Limitation Mobility: Walking and moving around   Mobility: Walking and Moving Around Current Status [phone]) At least 40 percent but less than 60 percent impaired, limited or restricted   Mobility: Walking and Moving Around Goal Status [phone]) At least 20 percent but less than 40 percent impaired, limited or restricted      Problem List Patient Active Problem List   Diagnosis Date Noted  . Restless legs syndrome (RLS) 12/25/2012  . Lumbosacral root lesions, not elsewhere classified 12/25/2012  . Postop Transfusion 01/07/2012  . Postop Acute blood loss anemia 01/06/2012  . Postop Hyponatremia 01/06/2012  . Failed total hip arthroplasty [facility]) 01/05/2012    Bess Harvest, PTA 11/17/16 8:49 PM   [facility] [address], [place], [place], [address] Phone: [phone]   Fax:  [phone]  Name: Nancy Baxter MRN: [id number] Date of Birth: 04-17-1940

## 2016-11-23 ENCOUNTER — Ambulatory Visit: Payer: PPO

## 2016-11-23 DIAGNOSIS — M545 Low back pain, unspecified: Secondary | ICD-10-CM

## 2016-11-23 DIAGNOSIS — G8929 Other chronic pain: Secondary | ICD-10-CM

## 2016-11-23 DIAGNOSIS — R293 Abnormal posture: Secondary | ICD-10-CM

## 2016-11-23 DIAGNOSIS — M6281 Muscle weakness (generalized): Secondary | ICD-10-CM

## 2016-11-23 NOTE — Therapy (Signed)
[facility] [address] [place], [place], [address] Phone: [phone]   Fax:  [phone]  Physical Therapy Treatment  Patient Details  Name: Nancy Baxter MRN: [id number] Date of Birth: 03/21/1941 Referring Provider: Dr. Kathyrn Sheriff  Encounter Date: 11/23/2016      PT End of Session - 11/23/16 0948    Visit Number 3   Date for PT Re-Evaluation 01/12/2017   Authorization Type G codes;  KX at visit 15   PT Start Time 0934   PT Stop Time 1015   PT Time Calculation (min) 41 min   Activity Tolerance Patient tolerated treatment well      Past Medical History:  Diagnosis Date  . Anemia   . Arthritis   . Complication of anesthesia   . GERD (gastroesophageal reflux disease)   . Heart murmur    evaluated 31yr ago-no cardiac follow up needed  . Hypertension   . PONV (postoperative nausea and vomiting)     Past Surgical History:  Procedure Laterality Date  . BACK SURGERY  1961   spinal fusion age   . CARPAL TUNNEL RELEASE Left 05/18/2016   Procedure: LEFT CARPAL TUNNEL RELEASE;  Surgeon: Daryll Brod, MD;  Location: [place];  Service: Orthopedics;  Laterality: Left;  . EAR CYST EXCISION  02/23/2011  . FOOT ARTHRODESIS  5/12   hammer toes,bunionectomy  . JOINT REPLACEMENT  2006   rt hip  . JOINT REPLACEMENT  2008   lt hip  . TONSILLECTOMY     age 34  . TOTAL HIP REVISION  01/05/2012   Procedure: TOTAL HIP REVISION;  Surgeon: Gearlean Alf, MD;  [facility];  Service: Orthopedics;  Laterality: Left;  . TRIGGER FINGER RELEASE  02/23/2011   Procedure: RELEASE TRIGGER FINGER/A-1 PULLEY;  Surgeon: Wynonia Sours, MD;  Location: [place];  Service: Orthopedics;  Laterality: Right;  . TRIGGER FINGER RELEASE Left 07/26/2012   Procedure: RELEASE TRIGGER FINGER/A-1 PULLEY LEFT SMALL FINGER;  Surgeon: Wynonia Sours, MD;  Location: [place];  Service: Orthopedics;   Laterality: Left;  . TRIGGER FINGER RELEASE Left 05/18/2016   Procedure: RELEASE TRIGGER FINGER/A-1 PULLEY LEFT INDEX;  Surgeon: Daryll Brod, MD;  Location: [place];  Service: Orthopedics;  Laterality: Left;  Marland Kitchen VAGINAL HYSTERECTOMY      There were no vitals filed for this visit.      Subjective Assessment - 11/23/16 0938    Subjective Pt. noting some L hip soreness over weekend and thinks this is from clam shell HEP activities.     Patient Stated Goals Get my ribs off my hips; get taller   Currently in Pain? Yes   Pain Score 6    Pain Location Back   Pain Orientation Right;Lower   Pain Descriptors / Indicators Dull   Pain Type Chronic pain   Pain Frequency Constant   Aggravating Factors  lifting   Pain Relieving Factors sitting    Multiple Pain Sites No                         [facility] Adult PT Treatment/Exercise - 11/23/16 1006      Lumbar Exercises: Aerobic   Stationary Bike NuStep: lvl 2, 10 min      Lumbar Exercises: Supine   Ab Set 10 reps;5 seconds   Bent Knee Raise 10 reps;3 seconds   Bent Knee Raise Limitations yellow TB  at knees      Knee/Hip Exercises: Stretches   Passive Hamstring Stretch Left;30 seconds   Passive Hamstring Stretch Limitations with strap   Piriformis Stretch Left;3 reps;30 seconds   Piriformis Stretch Limitations Figure-4, KTOS    Other Knee/Hip Stretches L ITB stretch x 30 sec; with strap   Other Knee/Hip Stretches Butterfly stretch for gloin x 30 sec      Manual Therapy   Manual Therapy Soft tissue mobilization;Myofascial release   Soft tissue mobilization STM over are of incision at L glute   Myofascial Release TPR to L glute/piriformis; limited tolerance                  PT Short Term Goals - 11/17/16 2045      PT SHORT TERM GOAL #1   Title be independent in initial HEP   Time 4   Period Weeks   Status On-going     PT SHORT TERM GOAL #2   Title Patient will be able to transfer in bed and  turn in bed with 25% less pain   Time 4   Period Weeks   Status On-going     PT SHORT TERM GOAL #3   Title tolerate standing and walking for 10-15 minutes to improve community activity   Time 4   Period Weeks   Status On-going           PT Long Term Goals - 11/17/16 2045      PT LONG TERM GOAL #1   Title be independent in advanced HEP needed for further improvements in posture and strength   Time 8   Period Weeks   Status On-going     PT LONG TERM GOAL #2   Title reduce FOTO to < or = to 41% limitation   Time 8   Period Weeks   Status On-going     PT LONG TERM GOAL #3   Title stand and walk for 30 minutes with minimal limitation to allow for community activity   Time 8   Period Weeks   Status On-going     PT LONG TERM GOAL #4   Title report a 50% reduction lumbar  pain with ADLs and volunteer tasks   Time 8   Period Weeks   Status On-going     PT LONG TERM GOAL #5   Title The patient will have improved bilateral hip strength to grossly 4/5 needed for standing and walking longer periods of time   Time 8   Period Weeks   Status On-going     PT LONG TERM GOAL #6   Title Height improved from 59 3/4 inches to 60 1/4 inches   Time 8   Period Weeks   Status On-going               Plan - 11/23/16 1003    Clinical Impression Statement Pt. noting increased soreness over weekend and feels this may have been from clamshell activity.  Pt. instructed to perform this activity at home without band now.  Some STM/TPR today to L gluteal area over incision from past hip replacement, which is very tender.  Pt. encouraged to perform self-massage with tennis ball to this area at home.  Tolerated all therex well today without issue.  Will plan to expand LE HEP in coming visits.     PT Treatment/Interventions ADLs/Self Care Home Management;Cryotherapy;Electrical Stimulation;Moist Heat;Therapeutic activities;Therapeutic exercise;Neuromuscular re-education;Patient/family  education;Manual techniques;Dry needling;Taping   PT Next Visit Plan gluteal  strengthening Progress abdominal brace series;  neutral spine stabilization;  postural strengthening;  modalities if needed      Patient will benefit from skilled therapeutic intervention in order to improve the following deficits and impairments:  Pain, Decreased strength, Postural dysfunction, Difficulty walking, Decreased activity tolerance  Visit Diagnosis: Chronic bilateral low back pain without sciatica  Muscle weakness (generalized)  Abnormal posture     Problem List Patient Active Problem List   Diagnosis Date Noted  . Restless legs syndrome (RLS) 12/25/2012  . Lumbosacral root lesions, not elsewhere classified 12/25/2012  . Postop Transfusion 01/07/2012  . Postop Acute blood loss anemia 01/06/2012  . Postop Hyponatremia 01/06/2012  . Failed total hip arthroplasty ([place]) 01/05/2012    Bess Harvest, PTA 11/23/16 6:09 PM  [facility] [address], [place], [place], [address] Phone: [phone]   Fax:  [phone]  Name: Nancy Baxter MRN: [id number] Date of Birth: Jun 06, 1940

## 2016-11-24 ENCOUNTER — Ambulatory Visit: Payer: PPO

## 2016-11-24 DIAGNOSIS — M545 Low back pain, unspecified: Secondary | ICD-10-CM

## 2016-11-24 DIAGNOSIS — R293 Abnormal posture: Secondary | ICD-10-CM

## 2016-11-24 DIAGNOSIS — M6281 Muscle weakness (generalized): Secondary | ICD-10-CM

## 2016-11-24 DIAGNOSIS — G8929 Other chronic pain: Secondary | ICD-10-CM

## 2016-11-24 NOTE — Therapy (Addendum)
[facility] [address], [place], [place], [address] Phone: [phone]   Fax:  [phone]  Physical Therapy Treatment/Discharge Summary  Patient Details  Name: Nancy Baxter MRN: [id number] Date of Birth: 18-Mar-1941 Referring Provider: Dr. Kathyrn Sheriff  Encounter Date: 11/24/2016      PT End of Session - 11/24/16 1024    Visit Number 4   Date for PT Re-Evaluation February 02, 2017   Authorization Type G codes;  KX at visit 15   PT Start Time 1020  pt. arrived late    PT Stop Time 1100   PT Time Calculation (min) 40 min   Activity Tolerance Patient tolerated treatment well      Past Medical History:  Diagnosis Date  . Anemia   . Arthritis   . Complication of anesthesia   . GERD (gastroesophageal reflux disease)   . Heart murmur    evaluated 37yrago-no cardiac follow up needed  . Hypertension   . PONV (postoperative nausea and vomiting)     Past Surgical History:  Procedure Laterality Date  . BACK SURGERY  1961   spinal fusion age   . CARPAL TUNNEL RELEASE Left 05/18/2016   Procedure: LEFT CARPAL TUNNEL RELEASE;  Surgeon: GDaryll Brod MD;  Location: [place]  Service: Orthopedics;  Laterality: Left;  . EAR CYST EXCISION  02/23/2011  . FOOT ARTHRODESIS  5/12   hammer toes,bunionectomy  . JOINT REPLACEMENT  2006   rt hip  . JOINT REPLACEMENT  2008   lt hip  . TONSILLECTOMY     age 76 . TOTAL HIP REVISION  01/05/2012   Procedure: TOTAL HIP REVISION;  Surgeon: FGearlean Alf MD;  [facility];  Service: Orthopedics;  Laterality: Left;  . TRIGGER FINGER RELEASE  02/23/2011   Procedure: RELEASE TRIGGER FINGER/A-1 PULLEY;  Surgeon: GWynonia Sours MD;  Location: [place]  Service: Orthopedics;  Laterality: Right;  . TRIGGER FINGER RELEASE Left 07/26/2012   Procedure: RELEASE TRIGGER FINGER/A-1 PULLEY LEFT SMALL FINGER;  Surgeon: GWynonia Sours MD;  Location[place]  Service: Orthopedics;  Laterality: Left;  . TRIGGER FINGER RELEASE Left 05/18/2016   Procedure: RELEASE TRIGGER FINGER/A-1 PULLEY LEFT INDEX;  Surgeon: GDaryll Brod MD;  [place] [facility]  Service: Orthopedics;  Laterality: Left;  .Marland KitchenVAGINAL HYSTERECTOMY      There were no vitals filed for this visit.      Subjective Assessment - 11/24/16 1031    Subjective Pt. doing well today.  Some muscular soreness after last treatment which had improved this morning.     Currently in Pain? Yes   Pain Score 3    Pain Location Back   Pain Orientation Right;Left;Lower   Pain Descriptors / Indicators Dull   Pain Type Chronic pain   Pain Onset More than a month ago   Pain Frequency Constant   Multiple Pain Sites No                         [facility] Adult PT Treatment/Exercise - 11/24/16 1038      Lumbar Exercises: Aerobic   Stationary Bike NuStep: lvl 2, 8 min      Lumbar Exercises: Standing   Row Both;15 reps;Theraband   Theraband Level (Row) Level 2 (Red)   Row Limitations cues for scap. squeeze    Shoulder Extension Both;15 reps   Theraband Level (Shoulder Extension) Level  2 (Red)   Shoulder Extension Limitations cues for scap. squeeze    Other Standing Lumbar Exercises B standing pallof press with red TB in door x 15 reps each way     Lumbar Exercises: Supine   Ab Set 5 seconds;15 reps   AB Set Limitations with sustained hip abd/ER with yellow TB at knees    Clam 10 reps;3 seconds   Clam Limitations yellow TB at knees      Lumbar Exercises: Sidelying   Clam 15 reps;3 seconds   Clam Limitations B sidelying without resistance                  PT Short Term Goals - 11/17/16 2045      PT SHORT TERM GOAL #1   Title be independent in initial HEP   Time 4   Period Weeks   Status On-going     PT SHORT TERM GOAL #2   Title Patient will be able to transfer in bed and turn in bed with 25% less pain   Time 4   Period Weeks   Status  On-going     PT SHORT TERM GOAL #3   Title tolerate standing and walking for 10-15 minutes to improve community activity   Time 4   Period Weeks   Status On-going           PT Long Term Goals - 11/17/16 2045      PT LONG TERM GOAL #1   Title be independent in advanced HEP needed for further improvements in posture and strength   Time 8   Period Weeks   Status On-going     PT LONG TERM GOAL #2   Title reduce FOTO to < or = to 41% limitation   Time 8   Period Weeks   Status On-going     PT LONG TERM GOAL #3   Title stand and walk for 30 minutes with minimal limitation to allow for community activity   Time 8   Period Weeks   Status On-going     PT LONG TERM GOAL #4   Title report a 50% reduction lumbar  pain with ADLs and volunteer tasks   Time 8   Period Weeks   Status On-going     PT LONG TERM GOAL #5   Title The patient will have improved bilateral hip strength to grossly 4/5 needed for standing and walking longer periods of time   Time 8   Period Weeks   Status On-going     PT LONG TERM GOAL #6   Title Height improved from 59 3/4 inches to 60 1/4 inches   Time 8   Period Weeks   Status On-going               Plan - 11/24/16 1036    Clinical Impression Statement Pt. doing well today noting some mild muscular soreness following last treatment.  Treatment focusing on HEP update with basic core level activities issued to pt. via handout.  Pallof press and upper back/core strengthening added today without issue.     PT Treatment/Interventions ADLs/Self Care Home Management;Cryotherapy;Electrical Stimulation;Moist Heat;Therapeutic activities;Therapeutic exercise;Neuromuscular re-education;Patient/family education;Manual techniques;Dry needling;Taping   PT Next Visit Plan gluteal strengthening Progress abdominal brace series;  neutral spine stabilization;  postural strengthening;  modalities if needed      Patient will benefit from skilled therapeutic  intervention in order to improve the following deficits and impairments:  Pain, Decreased strength, Postural dysfunction, Difficulty  walking, Decreased activity tolerance  Visit Diagnosis: Chronic bilateral low back pain without sciatica  Muscle weakness (generalized)  Abnormal posture    PHYSICAL THERAPY DISCHARGE SUMMARY  Visits from Start of Care: 4  Current functional level related to goals / functional outcomes: The patient did not return after last scheduled appt.  Her chart has been inactive for several months.  Will discharge from PT at this time.    Remaining deficits: As above   Education / Equipment: HEP Plan: Patient agrees to discharge.  Patient goals were not met. Patient is being discharged due to not returning since the last visit.  ?????        G codes:  Mobility, moving around  Goal CJ, Discharge CK  Problem List Patient Active Problem List   Diagnosis Date Noted  . Restless legs syndrome (RLS) 12/25/2012  . Lumbosacral root lesions, not elsewhere classified 12/25/2012  . Postop Transfusion 01/07/2012  . Postop Acute blood loss anemia 01/06/2012  . Postop Hyponatremia 01/06/2012  . Failed total hip arthroplasty ([place]) 01/05/2012   Ruben Im, PT 02/15/17 8:24 AM Phone: [phone] Fax: [phone]  Bess Harvest, PTA 11/24/16 1:56 PM  [facility] [address], [place], [place], [address] Phone: [phone]   Fax:  [phone]  Name: SHAKEVIA SARRIS MRN: [id number] Date of Birth: 09/02/1940

## 2017-02-09 DIAGNOSIS — Z8042 Family history of malignant neoplasm of prostate: Secondary | ICD-10-CM | POA: Diagnosis not present

## 2017-02-09 DIAGNOSIS — Z6827 Body mass index (BMI) 27.0-27.9, adult: Secondary | ICD-10-CM | POA: Diagnosis not present

## 2017-02-09 DIAGNOSIS — Z803 Family history of malignant neoplasm of breast: Secondary | ICD-10-CM | POA: Diagnosis not present

## 2017-02-09 DIAGNOSIS — Z8041 Family history of malignant neoplasm of ovary: Secondary | ICD-10-CM | POA: Diagnosis not present

## 2017-02-09 DIAGNOSIS — Z1231 Encounter for screening mammogram for malignant neoplasm of breast: Secondary | ICD-10-CM | POA: Diagnosis not present

## 2017-02-09 DIAGNOSIS — Z01419 Encounter for gynecological examination (general) (routine) without abnormal findings: Secondary | ICD-10-CM | POA: Diagnosis not present

## 2017-03-09 ENCOUNTER — Telehealth: Payer: Self-pay | Admitting: Neurology

## 2017-03-09 NOTE — Telephone Encounter (Addendum)
error 

## 2017-03-17 ENCOUNTER — Ambulatory Visit: Payer: PPO | Admitting: Neurology

## 2017-03-25 ENCOUNTER — Other Ambulatory Visit: Payer: Self-pay | Admitting: Pharmacy Technician

## 2017-03-25 NOTE — Patient Outreach (Signed)
[facility]) Care Management  03/25/2017  ATLEE VILLERS [birth year] [id number]  Incoming [employer] EMMI call in reference to medication adherence. HIPAA identifiers verified and verbal consent received. Patient states she takes all of her medication's daily as prescribed and does not have any barriers that would affect her adherence.  Doreene Burke, [place] [phone]

## 2017-04-01 ENCOUNTER — Ambulatory Visit (INDEPENDENT_AMBULATORY_CARE_PROVIDER_SITE_OTHER): Payer: PPO | Admitting: Neurology

## 2017-04-01 ENCOUNTER — Encounter: Payer: Self-pay | Admitting: Neurology

## 2017-04-01 DIAGNOSIS — G2581 Restless legs syndrome: Secondary | ICD-10-CM | POA: Diagnosis not present

## 2017-04-01 MED ORDER — ROPINIROLE HCL ER 2 MG PO TB24: 4.0000 mg | ORAL_TABLET | Freq: Every day | ORAL | 3 refills | Status: DC

## 2017-04-01 NOTE — Progress Notes (Signed)
[facility]  PATIENT: MORGIN HALLS DOB: 31-Oct-1940   REASON FOR VISIT:Followup for restless legs  HISTORY OF PRESENT ILLNESS: Mrs. Pewitt, a 76 year old female returns for follow up.  She claims that generic  Requip XL is not  working for her. We provided brand name, and she felt not better .  She says she can take the medicine and her legs begin to be restless. She noticed that a glass of red wine has also a detrimental affect. Prilosec seemed to decrease the effect, too. She continues with short acting dose in the afternoon. She has not given up any activities, she remains very active.  She returns for reevaluation.    HISTORY: decade history of restless legs, and found relief from Requip - responded very well to Requip XL and stated " it gave me my life back"/  she recently observed that the generic Requip may trigger some restless legs in the earlier day time .  has no other problems, she takes an additional dose of her Requip  every day, a short-acting dose , currently at 3 PM. 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  Restless legs do not interfere with this . she is taking her extended release Requip around 8:30 at night she does not go to bed until 11 or 12 PM.   Interval history from 03/16/2016, Mrs. Curto had just had a visit with her primary care physician on 01/19/2016, Her CBC was in normal limits, cholesterol total was 171 mg/dL, HDL 47, glucose 95 normal creatinine at 1.2 mg/dL, white blood cell count 6.2, no sign of anemia, TSH was 1.87. She continues to experience restless legs but they do not impair her quality of life as they used to. According to the RLS 6 rating scale , which she endorsed it over the last 7 days and nights, she had no impairment in daytime and mild-to-moderate at nighttime. She has not had vertigo for several years now, she has kept her body weight stable, she is physically active. She is here  for routine reevaluation and refills on her medications. One of her question is that if her medications may cause restless legs or may promote restless legs. The only way to find out is to reduce the Requip dose and see if she is doing better. She is at this time not very impaired and there is a chance that she will have breakthrough activity in the reduce the dose. Since she is taking Requip at 2 different times of the day I would like for her to not take the morning dose. If after 8 days she feels that restless legs are breaking through I would want her to resume her previous medication regimen. We could also try to cut the nighttime dose in half because she takes 2 tablets at bedtime.  Interval history from 01 April 2017, Mrs. Mcclory has regular lab test done through her primary care physician she is here today for refills of Requip medication that she has taken twice a day to control restless legs. She has found out that Prilosec interfered with the efficacy of Requip and has stopped taking it.  She feels that her restless legs are better controlled.  She still has a glass of wine in the evening.    REVIEW OF SYSTEMS: Full 14 system review of systems performed and notable only for those listed, all others are neg:  Cardiovascular: no palpitation.  Musculoskeletal:back pain,  third hip replacement. Titanium hip corroded. Now ceramic .   Sleep restless legs- beginning earlier 3PM.    ALLERGIES: Allergies  Allergen Reactions  . Sulfa Antibiotics Hives  . Codeine Nausea And Vomiting  . Prednisone Other (See Comments)    Shuts down adrenals  . Zolpidem Tartrate Other (See Comments)    sleepwalks  . Adhesive [Tape] Rash    blisters    HOME MEDICATIONS: Outpatient Medications Prior to Visit  Medication Sig Dispense Refill  . aspirin EC 81 MG tablet Take 81 mg by mouth daily.    . calcium carbonate (OS-CAL) 1250 MG chewable tablet Chew 1 tablet by mouth daily.    . ferrous fumarate  (HEMOCYTE - 106 MG FE) 325 (106 FE) MG TABS Take 37 mg of iron by mouth 2 (two) times a week. Pt takes two times weekly - Monday and thursdays    . Fish Oil-Cholecalciferol (FISH OIL + D3 PO) Take by mouth.    . furosemide (LASIX) 20 MG tablet 20 mg daily. Takes two tabs daily  2  . losartan (COZAAR) 50 MG tablet Take 50 mg by mouth every morning.     . metoprolol (TOPROL-XL) 50 MG 24 hr tablet Take 50 mg by mouth every morning.     . naproxen sodium (ALEVE) 220 MG tablet Take 220 mg by mouth daily as needed (take 2 tab).    Marland Kitchen omeprazole (PRILOSEC) 20 MG capsule Take 20 mg by mouth daily as needed.     Marland Kitchen rOPINIRole (REQUIP XL) 2 MG 24 hr tablet Take 2 tablets (4 mg total) by mouth at bedtime. 180 tablet 3  . oxyCODONE-acetaminophen (ROXICET) 5-325 MG tablet Take 1 tablet by mouth every 4 (four) hours as needed. (Patient not taking: Reported on 11/16/2016) 20 tablet 0   No facility-administered medications prior to visit.     PAST MEDICAL HISTORY: Past Medical History:  Diagnosis Date  . Anemia   . Arthritis   . Complication of anesthesia   . GERD (gastroesophageal reflux disease)   . Heart murmur    evaluated 11yr ago-no cardiac follow up needed  . Hypertension   . PONV (postoperative nausea and vomiting)     PAST SURGICAL HISTORY: Past Surgical History:  Procedure Laterality Date  . BACK SURGERY  1961   spinal fusion age   . CARPAL TUNNEL RELEASE Left 05/18/2016   Procedure: LEFT CARPAL TUNNEL RELEASE;  Surgeon: Daryll Brod, MD;  Location: [place];  Service: Orthopedics;  Laterality: Left;  . EAR CYST EXCISION  02/23/2011  . FOOT ARTHRODESIS  5/12   hammer toes,bunionectomy  . JOINT REPLACEMENT  2006   rt hip  . JOINT REPLACEMENT  2008   lt hip  . TONSILLECTOMY     age 89  . TOTAL HIP REVISION  01/05/2012   Procedure: TOTAL HIP REVISION;  Surgeon: Gearlean Alf, MD;  [facility];  Service: Orthopedics;  Laterality: Left;  . TRIGGER FINGER RELEASE   02/23/2011   Procedure: RELEASE TRIGGER FINGER/A-1 PULLEY;  Surgeon: Wynonia Sours, MD;  Location: [place];  Service: Orthopedics;  Laterality: Right;  . TRIGGER FINGER RELEASE Left 07/26/2012   Procedure: RELEASE TRIGGER FINGER/A-1 PULLEY LEFT SMALL FINGER;  Surgeon: Wynonia Sours, MD;  Location: [place];  Service: Orthopedics;  Laterality: Left;  . TRIGGER FINGER RELEASE Left 05/18/2016   Procedure: RELEASE TRIGGER FINGER/A-1 PULLEY LEFT INDEX;  Surgeon: Daryll Brod, MD;  [facility]: [facility];  Service: Orthopedics;  Laterality: Left;  Marland Kitchen VAGINAL HYSTERECTOMY      FAMILY HISTORY: Family History  Problem Relation Age of Onset  . Colon cancer Neg Hx   . Esophageal cancer Neg Hx   . Rectal cancer Neg Hx   . Stomach cancer Neg Hx     SOCIAL HISTORY: Social History   Socioeconomic History  . Marital status: Married    Spouse name: [name]   . Number of children: 5  . Years of education: College  . Highest education level: Not on file  Social Needs  . Financial resource strain: Not on file  . Food insecurity - worry: Not on file  . Food insecurity - inability: Not on file  . Transportation needs - medical: Not on file  . Transportation needs - non-medical: Not on file  Occupational History    Comment: retired  Tobacco Use  . Smoking status: Never Smoker  . Smokeless tobacco: Never Used  Substance and Sexual Activity  . Alcohol use: Yes    Alcohol/week: 0.0 oz    Comment: occasional, 03/17/15 infrequently  . Drug use: No  . Sexual activity: Not on file  Other Topics Concern  . Not on file  Social History Narrative   Patient is retired and married. [name])   Patient has some college education.    Patient has 5 children.    Right handed   Caffeine two cups of caffeine daily ( coffee)     PHYSICAL EXAM  Vitals:   04/01/17 1124  BP: 121/67  Pulse: 66  Weight: 127 lb (57.6 kg)  Height: 4\' 11"  (1.499 m)   Body mass index  is 25.65 kg/m.  Generalized: Well developed, in no acute distress  Neurological examination  Mentation: Alert oriented to time, place, history taking. Follows all commands speech and language fluent Cranial nerve - no change of taste in smell. Pupils were equal round reactive to light extraocular movements were full, visual field were full on confrontational test. Facial sensation and strength were normal. hearing was intact to finger rubbing bilaterally. Uvula and tongue moved midline.  head turning and shoulder shrug were symmetric.Motor:Normal bulk and tone, full strength - fine finger movements normal, no pronator drift. No focal weakness in the upper extremities.  Coordination: finger-nose-finger, neither ataxia, not tremor, nor dysmetria Reflexes: 1+ upper lower and symmetric  DIAGNOSTIC DATA (LABS, IMAGING, TESTING)  ASSESSMENT AND PLAN  76 y.o. year old female with acute onset of restless legs after first hip surgery 9 years ago. Since than moderate relief on Requip. Here to follow up twice yearly .  She was switched to generic Requip XL by the pharmacy and it is not working. We discussed the Nupro patch.  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  Patient has more symptoms on generic Requip XL, but due to costs remained on generic. Ropinirole.   Neupro patch discussed was never tried , due to costs.   F/U q 12 months with either Dennie Bible, GNP, or me.    Larey Seat, MD   [facility] [address] [place], [place] [address] [phone]     Cc Dr Virgina Jock,

## 2017-05-12 DIAGNOSIS — R82998 Other abnormal findings in urine: Secondary | ICD-10-CM | POA: Diagnosis not present

## 2017-05-12 DIAGNOSIS — I10 Essential (primary) hypertension: Secondary | ICD-10-CM | POA: Diagnosis not present

## 2017-05-18 DIAGNOSIS — Z1212 Encounter for screening for malignant neoplasm of rectum: Secondary | ICD-10-CM | POA: Diagnosis not present

## 2017-05-19 DIAGNOSIS — N183 Chronic kidney disease, stage 3 (moderate): Secondary | ICD-10-CM | POA: Diagnosis not present

## 2017-05-19 DIAGNOSIS — Z1389 Encounter for screening for other disorder: Secondary | ICD-10-CM | POA: Diagnosis not present

## 2017-05-19 DIAGNOSIS — Z6827 Body mass index (BMI) 27.0-27.9, adult: Secondary | ICD-10-CM | POA: Diagnosis not present

## 2017-05-19 DIAGNOSIS — I8390 Asymptomatic varicose veins of unspecified lower extremity: Secondary | ICD-10-CM | POA: Diagnosis not present

## 2017-05-19 DIAGNOSIS — I129 Hypertensive chronic kidney disease with stage 1 through stage 4 chronic kidney disease, or unspecified chronic kidney disease: Secondary | ICD-10-CM | POA: Diagnosis not present

## 2017-05-19 DIAGNOSIS — M545 Low back pain: Secondary | ICD-10-CM | POA: Diagnosis not present

## 2017-05-19 DIAGNOSIS — G2581 Restless legs syndrome: Secondary | ICD-10-CM | POA: Diagnosis not present

## 2017-05-19 DIAGNOSIS — Z0000 Encounter for general adult medical examination without abnormal findings: Secondary | ICD-10-CM | POA: Diagnosis not present

## 2017-05-19 DIAGNOSIS — G5603 Carpal tunnel syndrome, bilateral upper limbs: Secondary | ICD-10-CM | POA: Diagnosis not present

## 2017-05-19 DIAGNOSIS — K219 Gastro-esophageal reflux disease without esophagitis: Secondary | ICD-10-CM | POA: Diagnosis not present

## 2017-05-19 DIAGNOSIS — M419 Scoliosis, unspecified: Secondary | ICD-10-CM | POA: Diagnosis not present

## 2017-06-14 ENCOUNTER — Ambulatory Visit: Payer: PPO | Admitting: Neurology

## 2017-10-25 DIAGNOSIS — H524 Presbyopia: Secondary | ICD-10-CM | POA: Diagnosis not present

## 2017-10-25 DIAGNOSIS — H52223 Regular astigmatism, bilateral: Secondary | ICD-10-CM | POA: Diagnosis not present

## 2018-02-08 DIAGNOSIS — I129 Hypertensive chronic kidney disease with stage 1 through stage 4 chronic kidney disease, or unspecified chronic kidney disease: Secondary | ICD-10-CM | POA: Diagnosis not present

## 2018-02-08 DIAGNOSIS — R197 Diarrhea, unspecified: Secondary | ICD-10-CM | POA: Diagnosis not present

## 2018-02-08 DIAGNOSIS — I8390 Asymptomatic varicose veins of unspecified lower extremity: Secondary | ICD-10-CM | POA: Diagnosis not present

## 2018-02-08 DIAGNOSIS — K219 Gastro-esophageal reflux disease without esophagitis: Secondary | ICD-10-CM | POA: Diagnosis not present

## 2018-02-08 DIAGNOSIS — R600 Localized edema: Secondary | ICD-10-CM | POA: Diagnosis not present

## 2018-02-08 DIAGNOSIS — I10 Essential (primary) hypertension: Secondary | ICD-10-CM | POA: Diagnosis not present

## 2018-02-08 DIAGNOSIS — N183 Chronic kidney disease, stage 3 (moderate): Secondary | ICD-10-CM | POA: Diagnosis not present

## 2018-02-08 DIAGNOSIS — Z6827 Body mass index (BMI) 27.0-27.9, adult: Secondary | ICD-10-CM | POA: Diagnosis not present

## 2018-02-09 DIAGNOSIS — R197 Diarrhea, unspecified: Secondary | ICD-10-CM | POA: Diagnosis not present

## 2018-02-14 DIAGNOSIS — I10 Essential (primary) hypertension: Secondary | ICD-10-CM | POA: Diagnosis not present

## 2018-02-14 DIAGNOSIS — Z6827 Body mass index (BMI) 27.0-27.9, adult: Secondary | ICD-10-CM | POA: Diagnosis not present

## 2018-02-14 DIAGNOSIS — R197 Diarrhea, unspecified: Secondary | ICD-10-CM | POA: Diagnosis not present

## 2018-02-14 DIAGNOSIS — M545 Low back pain: Secondary | ICD-10-CM | POA: Diagnosis not present

## 2018-02-14 DIAGNOSIS — R600 Localized edema: Secondary | ICD-10-CM | POA: Diagnosis not present

## 2018-02-14 DIAGNOSIS — N183 Chronic kidney disease, stage 3 (moderate): Secondary | ICD-10-CM | POA: Diagnosis not present

## 2018-02-27 ENCOUNTER — Other Ambulatory Visit: Payer: Self-pay | Admitting: Neurology

## 2018-02-27 ENCOUNTER — Telehealth: Payer: Self-pay | Admitting: Neurology

## 2018-02-27 DIAGNOSIS — G2581 Restless legs syndrome: Secondary | ICD-10-CM

## 2018-02-27 MED ORDER — ROPINIROLE HCL ER 2 MG PO TB24: 4.0000 mg | ORAL_TABLET | Freq: Every day | ORAL | 0 refills | Status: DC

## 2018-02-27 NOTE — Telephone Encounter (Signed)
Order sent for the patient for 6 tablets to the pharmacy she mentioned.

## 2018-02-27 NOTE — Telephone Encounter (Signed)
Patient is out of town and left medication at home Can a new Rx for rOPINIRole (REQUIP XL) 2 MG 24 hr tablet #6 be called to [facility] in [place], [place] telephone [phone]. 6 pills would last until she gets home.

## 2018-03-17 DIAGNOSIS — I10 Essential (primary) hypertension: Secondary | ICD-10-CM | POA: Diagnosis not present

## 2018-04-03 ENCOUNTER — Other Ambulatory Visit: Payer: Self-pay | Admitting: Neurology

## 2018-04-03 DIAGNOSIS — G2581 Restless legs syndrome: Secondary | ICD-10-CM

## 2018-04-10 ENCOUNTER — Encounter: Payer: Self-pay | Admitting: Neurology

## 2018-04-17 ENCOUNTER — Ambulatory Visit: Payer: PPO | Admitting: Neurology

## 2018-04-21 DIAGNOSIS — Z1231 Encounter for screening mammogram for malignant neoplasm of breast: Secondary | ICD-10-CM | POA: Diagnosis not present

## 2018-04-21 DIAGNOSIS — M8588 Other specified disorders of bone density and structure, other site: Secondary | ICD-10-CM | POA: Diagnosis not present

## 2018-04-21 DIAGNOSIS — Z6827 Body mass index (BMI) 27.0-27.9, adult: Secondary | ICD-10-CM | POA: Diagnosis not present

## 2018-04-21 DIAGNOSIS — N958 Other specified menopausal and perimenopausal disorders: Secondary | ICD-10-CM | POA: Diagnosis not present

## 2018-04-21 DIAGNOSIS — Z01419 Encounter for gynecological examination (general) (routine) without abnormal findings: Secondary | ICD-10-CM | POA: Diagnosis not present

## 2018-05-15 DIAGNOSIS — R82998 Other abnormal findings in urine: Secondary | ICD-10-CM | POA: Diagnosis not present

## 2018-05-15 DIAGNOSIS — I10 Essential (primary) hypertension: Secondary | ICD-10-CM | POA: Diagnosis not present

## 2018-05-17 DIAGNOSIS — M25562 Pain in left knee: Secondary | ICD-10-CM | POA: Diagnosis not present

## 2018-05-17 DIAGNOSIS — M170 Bilateral primary osteoarthritis of knee: Secondary | ICD-10-CM | POA: Diagnosis not present

## 2018-05-22 ENCOUNTER — Ambulatory Visit: Payer: PPO | Admitting: Adult Health

## 2018-05-22 ENCOUNTER — Telehealth: Payer: Self-pay

## 2018-05-22 DIAGNOSIS — Z1339 Encounter for screening examination for other mental health and behavioral disorders: Secondary | ICD-10-CM | POA: Diagnosis not present

## 2018-05-22 DIAGNOSIS — R5383 Other fatigue: Secondary | ICD-10-CM | POA: Diagnosis not present

## 2018-05-22 DIAGNOSIS — M419 Scoliosis, unspecified: Secondary | ICD-10-CM | POA: Diagnosis not present

## 2018-05-22 DIAGNOSIS — M545 Low back pain: Secondary | ICD-10-CM | POA: Diagnosis not present

## 2018-05-22 DIAGNOSIS — Z1331 Encounter for screening for depression: Secondary | ICD-10-CM | POA: Diagnosis not present

## 2018-05-22 DIAGNOSIS — N183 Chronic kidney disease, stage 3 (moderate): Secondary | ICD-10-CM | POA: Diagnosis not present

## 2018-05-22 DIAGNOSIS — I8390 Asymptomatic varicose veins of unspecified lower extremity: Secondary | ICD-10-CM | POA: Diagnosis not present

## 2018-05-22 DIAGNOSIS — Z23 Encounter for immunization: Secondary | ICD-10-CM | POA: Diagnosis not present

## 2018-05-22 DIAGNOSIS — M1990 Unspecified osteoarthritis, unspecified site: Secondary | ICD-10-CM | POA: Diagnosis not present

## 2018-05-22 DIAGNOSIS — Z6827 Body mass index (BMI) 27.0-27.9, adult: Secondary | ICD-10-CM | POA: Diagnosis not present

## 2018-05-22 DIAGNOSIS — I129 Hypertensive chronic kidney disease with stage 1 through stage 4 chronic kidney disease, or unspecified chronic kidney disease: Secondary | ICD-10-CM | POA: Diagnosis not present

## 2018-05-22 DIAGNOSIS — Z0000 Encounter for general adult medical examination without abnormal findings: Secondary | ICD-10-CM | POA: Diagnosis not present

## 2018-05-22 DIAGNOSIS — I872 Venous insufficiency (chronic) (peripheral): Secondary | ICD-10-CM | POA: Diagnosis not present

## 2018-05-22 NOTE — Telephone Encounter (Signed)
Patient no show for appt today. 

## 2018-05-22 NOTE — Progress Notes (Deleted)
[facility]  PATIENT: Nancy Baxter DOB: 08-21-1940   REASON FOR VISIT:Followup for restless legs  HISTORY OF PRESENT ILLNESS:   HISTORY SUMMARY Nancy Baxter is a 78 y.o. female with PMH of anemia, arthritis, hypertension, vertigo and restless leg syndrome.  She has been followed in this office with Dr. Brett Fairy for management of RLS with adequate control on Requip XL for many years.  Did not have benefit from generic form.  Per review of notes, attempted to decrease medication in the past due to continued adequate control of RLS but unfortunately experience worsening RLS symptoms without medication use therefore reinitiated prior dose.  She continues on Requip XL 2 mg 24-hour tablet 2 tabs nightly with continued benefit of RLS symptoms.  She continues to tolerate well without any reported side effects.  She returns today for reevaluation and medication refill.  No further concerns at this time.         REVIEW OF SYSTEMS: Full 14 system review of systems performed and notable only for those listed, all others are neg:     ALLERGIES: Allergies  Allergen Reactions  . Sulfa Antibiotics Hives  . Codeine Nausea And Vomiting  . Prednisone Other (See Comments)    Shuts down adrenals  . Zolpidem Tartrate Other (See Comments)    sleepwalks  . Adhesive [Tape] Rash    blisters    HOME MEDICATIONS: Outpatient Medications Prior to Visit  Medication Sig Dispense Refill  . aspirin EC 81 MG tablet Take 81 mg by mouth daily.    . calcium carbonate (OS-CAL) 1250 MG chewable tablet Chew 1 tablet by mouth daily.    . ferrous fumarate (HEMOCYTE - 106 MG FE) 325 (106 FE) MG TABS Take 37 mg of iron by mouth 2 (two) times a week. Pt takes two times weekly - Monday and thursdays    . Fish Oil-Cholecalciferol (FISH OIL + D3 PO) Take by mouth.    . furosemide (LASIX) 20 MG tablet 20 mg daily. Takes two tabs daily  2  . losartan (COZAAR) 50 MG tablet Take 50 mg by mouth  every morning.     . metoprolol (TOPROL-XL) 50 MG 24 hr tablet Take 50 mg by mouth every morning.     . naproxen sodium (ALEVE) 220 MG tablet Take 220 mg by mouth daily as needed (take 2 tab).    Marland Kitchen omeprazole (PRILOSEC) 20 MG capsule Take 20 mg by mouth daily as needed.     Marland Kitchen rOPINIRole (REQUIP XL) 2 MG 24 hr tablet TAKE 2 TABLETS BY MOUTH AT BEDTIME. 180 tablet 0   No facility-administered medications prior to visit.     PAST MEDICAL HISTORY: Past Medical History:  Diagnosis Date  . Anemia   . Arthritis   . Complication of anesthesia   . GERD (gastroesophageal reflux disease)   . Heart murmur    evaluated 32yr ago-no cardiac follow up needed  . Hypertension   . PONV (postoperative nausea and vomiting)     PAST SURGICAL HISTORY: Past Surgical History:  Procedure Laterality Date  . BACK SURGERY  1961   spinal fusion age   . CARPAL TUNNEL RELEASE Left 05/18/2016   Procedure: LEFT CARPAL TUNNEL RELEASE;  Surgeon: Daryll Brod, MD;  Location: [facility];  Service: Orthopedics;  Laterality: Left;  . EAR CYST EXCISION  02/23/2011  . FOOT ARTHRODESIS  5/12   hammer toes,bunionectomy  . JOINT REPLACEMENT  2006   rt hip  .  JOINT REPLACEMENT  2008   lt hip  . TONSILLECTOMY     age 80  . TOTAL HIP REVISION  01/05/2012   Procedure: TOTAL HIP REVISION;  Surgeon: Gearlean Alf, MD;  Location: [facility];  Service: Orthopedics;  Laterality: Left;  . TRIGGER FINGER RELEASE  02/23/2011   Procedure: RELEASE TRIGGER FINGER/A-1 PULLEY;  Surgeon: Wynonia Sours, MD;  Location: [facility];  Service: Orthopedics;  Laterality: Right;  . TRIGGER FINGER RELEASE Left 07/26/2012   Procedure: RELEASE TRIGGER FINGER/A-1 PULLEY LEFT SMALL FINGER;  Surgeon: Wynonia Sours, MD;  Location: [facility];  Service: Orthopedics;  Laterality: Left;  . TRIGGER FINGER RELEASE Left 05/18/2016   Procedure: RELEASE TRIGGER FINGER/A-1 PULLEY LEFT INDEX;  Surgeon: Daryll Brod, MD;   Location: [facility];  Service: Orthopedics;  Laterality: Left;  Marland Kitchen VAGINAL HYSTERECTOMY      FAMILY HISTORY: Family History  Problem Relation Age of Onset  . Colon cancer Neg Hx   . Esophageal cancer Neg Hx   . Rectal cancer Neg Hx   . Stomach cancer Neg Hx     SOCIAL HISTORY: Social History   Socioeconomic History  . Marital status: Married    Spouse name: [name]   . Number of children: 5  . Years of education: College  . Highest education level: Not on file  Occupational History    Comment: retired  Scientific laboratory technician  . Financial resource strain: Not on file  . Food insecurity:    Worry: Not on file    Inability: Not on file  . Transportation needs:    Medical: Not on file    Non-medical: Not on file  Tobacco Use  . Smoking status: Never Smoker  . Smokeless tobacco: Never Used  Substance and Sexual Activity  . Alcohol use: Yes    Comment: occasional, 03/17/15 infrequently  . Drug use: No  . Sexual activity: Not on file  Lifestyle  . Physical activity:    Days per week: Not on file    Minutes per session: Not on file  . Stress: Not on file  Relationships  . Social connections:    Talks on phone: Not on file    Gets together: Not on file    Attends religious service: Not on file    Active member of club or organization: Not on file    Attends meetings of clubs or organizations: Not on file    Relationship status: Not on file  . Intimate partner violence:    Fear of current or ex partner: Not on file    Emotionally abused: Not on file    Physically abused: Not on file    Forced sexual activity: Not on file  Other Topics Concern  . Not on file  Social History Narrative   Patient is retired and married. [name])   Patient has some college education.    Patient has 5 children.    Right handed   Caffeine two cups of caffeine daily ( coffee)     PHYSICAL EXAM  There were no vitals filed for this visit. There is no height or weight on file to  calculate BMI.  General: well developed, well nourished, seated, in no evident distress Head: head normocephalic and atraumatic.   Neck: supple with no carotid or supraclavicular bruits Cardiovascular: regular rate and rhythm, no murmurs Musculoskeletal: no deformity Skin:  no rash/petichiae Vascular:  Normal pulses all extremities  Neurologic Exam Mental Status:  Awake and fully alert. Oriented to place and time. Recent and remote memory intact. Attention span, concentration and fund of knowledge appropriate. Mood and affect appropriate.  Cranial Nerves: Fundoscopic exam reveals sharp disc margins. Pupils equal, briskly reactive to light. Extraocular movements full without nystagmus. Visual fields full to confrontation. Hearing intact. Facial sensation intact. Face, tongue, palate moves normally and symmetrically.  Motor: Normal bulk and tone. Normal strength in all tested extremity muscles. Sensory.: intact to touch , pinprick , position and vibratory sensation.  Coordination: Rapid alternating movements normal in all extremities. Finger-to-nose and heel-to-shin performed accurately bilaterally. Gait and Station: Arises from chair without difficulty. Stance is normal. Gait demonstrates normal stride length and balance. Able to heel, toe and tandem walk without difficulty.  Reflexes: 1+ and symmetric. Toes downgoing.     ASSESSMENT AND PLAN  Nancy Baxter is a 78 y.o. female who continues to be followed in this office by Dr. Brett Fairy for management of RLS and continued use of Requip XL.      Greater than 50% of time during this 25 minute visit was spent on counseling,explanation of diagnosis of RLS, continued use of Requip XL, planning of further management, discussion with patient and family and coordination of care   Venancio Poisson, AGNP-BC  [facility] [address] [place], [place] [address]  Phone [phone] Fax [phone] Note:  This document was prepared with digital dictation and possible smart phrase technology. Any transcriptional errors that result from this process are unintentional.

## 2018-05-23 ENCOUNTER — Encounter: Payer: Self-pay | Admitting: Adult Health

## 2018-05-24 ENCOUNTER — Ambulatory Visit: Payer: PPO | Admitting: Adult Health

## 2018-05-24 NOTE — Progress Notes (Deleted)
[facility]  PATIENT: Nancy Baxter DOB: 1940-09-24   REASON FOR VISIT:Followup for restless legs  No chief complaint on file.    HISTORY OF PRESENT ILLNESS:  05/24/18 VISIT Nancy Baxter is a 78 y.o. female with PMH of anemia, arthritis, hypertension, vertigo and restless leg syndrome.  She has been followed in this office with Dr. Brett Fairy for management of RLS with adequate control on Requip XL for many years.  Did not have benefit from generic form.  Per review of notes, attempted to decrease medication in the past due to continued adequate control of RLS but unfortunately experience worsening RLS symptoms without medication use therefore reinitiated prior dose.  She continues on Requip XL 2 mg 24-hour tablet 2 tabs nightly with continued benefit of RLS symptoms.  She continues to tolerate well without any reported side effects.  She returns today for reevaluation and medication refill.  No further concerns at this time.         REVIEW OF SYSTEMS: Full 14 system review of systems performed and notable only for those listed, all others are neg:     ALLERGIES: Allergies  Allergen Reactions  . Sulfa Antibiotics Hives  . Codeine Nausea And Vomiting  . Prednisone Other (See Comments)    Shuts down adrenals  . Zolpidem Tartrate Other (See Comments)    sleepwalks  . Adhesive [Tape] Rash    blisters    HOME MEDICATIONS: Outpatient Medications Prior to Visit  Medication Sig Dispense Refill  . aspirin EC 81 MG tablet Take 81 mg by mouth daily.    . calcium carbonate (OS-CAL) 1250 MG chewable tablet Chew 1 tablet by mouth daily.    . ferrous fumarate (HEMOCYTE - 106 MG FE) 325 (106 FE) MG TABS Take 37 mg of iron by mouth 2 (two) times a week. Pt takes two times weekly - Monday and thursdays    . Fish Oil-Cholecalciferol (FISH OIL + D3 PO) Take by mouth.    . furosemide (LASIX) 20 MG tablet 20 mg daily. Takes two tabs daily  2  . losartan (COZAAR)  50 MG tablet Take 50 mg by mouth every morning.     . metoprolol (TOPROL-XL) 50 MG 24 hr tablet Take 50 mg by mouth every morning.     . naproxen sodium (ALEVE) 220 MG tablet Take 220 mg by mouth daily as needed (take 2 tab).    Marland Kitchen omeprazole (PRILOSEC) 20 MG capsule Take 20 mg by mouth daily as needed.     Marland Kitchen rOPINIRole (REQUIP XL) 2 MG 24 hr tablet TAKE 2 TABLETS BY MOUTH AT BEDTIME. 180 tablet 0   No facility-administered medications prior to visit.     PAST MEDICAL HISTORY: Past Medical History:  Diagnosis Date  . Anemia   . Arthritis   . Complication of anesthesia   . GERD (gastroesophageal reflux disease)   . Heart murmur    evaluated 57yr ago-no cardiac follow up needed  . Hypertension   . PONV (postoperative nausea and vomiting)     PAST SURGICAL HISTORY: Past Surgical History:  Procedure Laterality Date  . BACK SURGERY  1961   spinal fusion age   . CARPAL TUNNEL RELEASE Left 05/18/2016   Procedure: LEFT CARPAL TUNNEL RELEASE;  Surgeon: Daryll Brod, MD;  Location: [facility];  Service: Orthopedics;  Laterality: Left;  . EAR CYST EXCISION  02/23/2011  . FOOT ARTHRODESIS  5/12   hammer toes,bunionectomy  . JOINT REPLACEMENT  2006   rt hip  . JOINT REPLACEMENT  2008   lt hip  . TONSILLECTOMY     age 48  . TOTAL HIP REVISION  01/05/2012   Procedure: TOTAL HIP REVISION;  Surgeon: Gearlean Alf, MD;  Location: [facility];  Service: Orthopedics;  Laterality: Left;  . TRIGGER FINGER RELEASE  02/23/2011   Procedure: RELEASE TRIGGER FINGER/A-1 PULLEY;  Surgeon: Wynonia Sours, MD;  Location: [facility];  Service: Orthopedics;  Laterality: Right;  . TRIGGER FINGER RELEASE Left 07/26/2012   Procedure: RELEASE TRIGGER FINGER/A-1 PULLEY LEFT SMALL FINGER;  Surgeon: Wynonia Sours, MD;  Location: [facility];  Service: Orthopedics;  Laterality: Left;  . TRIGGER FINGER RELEASE Left 05/18/2016   Procedure: RELEASE TRIGGER FINGER/A-1 PULLEY LEFT INDEX;   Surgeon: Daryll Brod, MD;  Location: [facility];  Service: Orthopedics;  Laterality: Left;  Marland Kitchen VAGINAL HYSTERECTOMY      FAMILY HISTORY: Family History  Problem Relation Age of Onset  . Colon cancer Neg Hx   . Esophageal cancer Neg Hx   . Rectal cancer Neg Hx   . Stomach cancer Neg Hx     SOCIAL HISTORY: Social History   Socioeconomic History  . Marital status: Married    Spouse name: Nancy Baxter   . Number of children: 5  . Years of education: College  . Highest education level: Not on file  Occupational History    Comment: retired  Scientific laboratory technician  . Financial resource strain: Not on file  . Food insecurity:    Worry: Not on file    Inability: Not on file  . Transportation needs:    Medical: Not on file    Non-medical: Not on file  Tobacco Use  . Smoking status: Never Smoker  . Smokeless tobacco: Never Used  Substance and Sexual Activity  . Alcohol use: Yes    Comment: occasional, 03/17/15 infrequently  . Drug use: No  . Sexual activity: Not on file  Lifestyle  . Physical activity:    Days per week: Not on file    Minutes per session: Not on file  . Stress: Not on file  Relationships  . Social connections:    Talks on phone: Not on file    Gets together: Not on file    Attends religious service: Not on file    Active member of club or organization: Not on file    Attends meetings of clubs or organizations: Not on file    Relationship status: Not on file  . Intimate partner violence:    Fear of current or ex partner: Not on file    Emotionally abused: Not on file    Physically abused: Not on file    Forced sexual activity: Not on file  Other Topics Concern  . Not on file  Social History Narrative   Patient is retired and married. Nancy Baxter)   Patient has some college education.    Patient has 5 children.    Right handed   Caffeine two cups of caffeine daily ( coffee)     PHYSICAL EXAM  There were no vitals filed for this visit. There is no  height or weight on file to calculate BMI.  General: well developed, well nourished, seated, in no evident distress Head: head normocephalic and atraumatic.   Neck: supple with no carotid or supraclavicular bruits Cardiovascular: regular rate and rhythm, no murmurs Musculoskeletal: no deformity Skin:  no rash/petichiae Vascular:  Normal pulses  all extremities  Neurologic Exam Mental Status: Awake and fully alert. Oriented to place and time. Recent and remote memory intact. Attention span, concentration and fund of knowledge appropriate. Mood and affect appropriate.  Cranial Nerves: Fundoscopic exam reveals sharp disc margins. Pupils equal, briskly reactive to light. Extraocular movements full without nystagmus. Visual fields full to confrontation. Hearing intact. Facial sensation intact. Face, tongue, palate moves normally and symmetrically.  Motor: Normal bulk and tone. Normal strength in all tested extremity muscles. Sensory.: intact to touch , pinprick , position and vibratory sensation.  Coordination: Rapid alternating movements normal in all extremities. Finger-to-nose and heel-to-shin performed accurately bilaterally. Gait and Station: Arises from chair without difficulty. Stance is normal. Gait demonstrates normal stride length and balance. Able to heel, toe and tandem walk without difficulty.  Reflexes: 1+ and symmetric. Toes downgoing.     ASSESSMENT AND PLAN  ANQUINETTE PIERRO is a 77 y.o. female who continues to be followed in this office by Dr. Brett Fairy for management of RLS and continued use of Requip XL.      Greater than 50% of time during this 25 minute visit was spent on counseling,explanation of diagnosis of RLS, continued use of Requip XL, planning of further management, discussion with patient and family and coordination of care   Venancio Poisson, AGNP-BC  [facility] [address] [place], [place] [address]  Phone  [phone] Fax [phone] Note: This document was prepared with digital dictation and possible smart phrase technology. Any transcriptional errors that result from this process are unintentional.

## 2018-05-25 ENCOUNTER — Encounter: Payer: Self-pay | Admitting: Adult Health

## 2018-06-14 DIAGNOSIS — M1712 Unilateral primary osteoarthritis, left knee: Secondary | ICD-10-CM | POA: Diagnosis not present

## 2018-06-21 DIAGNOSIS — M25562 Pain in left knee: Secondary | ICD-10-CM | POA: Diagnosis not present

## 2018-06-21 DIAGNOSIS — M1712 Unilateral primary osteoarthritis, left knee: Secondary | ICD-10-CM | POA: Diagnosis not present

## 2018-06-28 DIAGNOSIS — M25562 Pain in left knee: Secondary | ICD-10-CM | POA: Diagnosis not present

## 2018-06-28 DIAGNOSIS — M1712 Unilateral primary osteoarthritis, left knee: Secondary | ICD-10-CM | POA: Diagnosis not present

## 2018-07-03 ENCOUNTER — Other Ambulatory Visit: Payer: Self-pay | Admitting: Neurology

## 2018-07-03 DIAGNOSIS — G2581 Restless legs syndrome: Secondary | ICD-10-CM

## 2018-08-10 DIAGNOSIS — M1712 Unilateral primary osteoarthritis, left knee: Secondary | ICD-10-CM | POA: Diagnosis not present

## 2018-08-22 ENCOUNTER — Telehealth: Payer: Self-pay

## 2018-08-22 NOTE — Telephone Encounter (Signed)
IF patient calls back change her to Judson Roch schedule on Thursday to another time. We also need verbal consent to do video and to file insurance.I stated this is due to COVID 19 pandemic. We need to know her email address to send link to. Also if she has a smart phone with a camera( I phone, samsung, galaxy, android of [employer] or any phone.

## 2018-08-23 NOTE — Telephone Encounter (Signed)
Pt returned call and provided consent for video visit and to bill insurance, pt is going to use her computer for visit   Email- [email or web address]  [email or web address]  Link sent to pts email. Pt confirmed she received

## 2018-08-23 NOTE — Telephone Encounter (Signed)
Noted  

## 2018-08-24 ENCOUNTER — Encounter: Payer: Self-pay | Admitting: Neurology

## 2018-08-24 ENCOUNTER — Ambulatory Visit (INDEPENDENT_AMBULATORY_CARE_PROVIDER_SITE_OTHER): Payer: PPO | Admitting: Neurology

## 2018-08-24 ENCOUNTER — Other Ambulatory Visit: Payer: Self-pay

## 2018-08-24 DIAGNOSIS — G2581 Restless legs syndrome: Secondary | ICD-10-CM

## 2018-08-24 MED ORDER — ROPINIROLE HCL ER 2 MG PO TB24: 4.0000 mg | ORAL_TABLET | Freq: Every day | ORAL | 3 refills | Status: DC

## 2018-08-24 NOTE — Progress Notes (Signed)
Virtual Visit via Video Note  I connected with Wallene Dales on 08/24/18 at  9:15 AM EDT by a video enabled telemedicine application and verified that I am speaking with the correct person using two identifiers.  Location: Patient: At her home Provider: At my home   I discussed the limitations of evaluation and management by telemedicine and the availability of in person appointments. The patient expressed understanding and agreed to proceed.  History of Present Illness: 08/24/2018: Ms. Tseng is a 78 year old female with history of restless leg syndrome.  She is currently taking ropinirole 2 mg 24 hr tablets, taking 2 tablets at bedtime.  She reports that she has to take the medication at 9:30 PM for the medication to work.  If she waits until 11 PM when she goes to bed, her restless legs give her a fit.  The medication has been very beneficial.  She indicates that she is doing quite well.  She denies any trouble with her walking or her balance.  She is a very active 78 year old woman, she still volunteers at an adult daycare center.  She is currently planning to re-decorate the center.  She is also going to become a great grandmother.  She says she is not able to take aspirin or NSAIDs, due to elevated creatinine of 1.7. She has noticed that taking Prilosec or having a glass of wine makes the ropinirole not work as well. She has been staying up later at night watching Netflix during the pandemic.  04/01/2017 Dr. Brett Fairy: Mrs. Stanish, a 78 year old female returns for follow up.  She claims that generic  Requip XL is not  working for her. We provided brand name, and she felt not better .  She says she can take the medicine and her legs begin to be restless. She noticed that a glass of red wine has also a detrimental affect. Prilosec seemed to decrease the effect, too. She continues with short acting dose in the afternoon. She has not given up any activities, she remains very active.  She returns  for reevaluation.    HISTORY: decade history of restless legs, and found relief from Requip - responded very well to Requip XL and stated " it gave me my life back"/  she recently observed that the generic Requip may trigger some restless legs in the earlier day time .  has no other problems, she takes an additional dose of her Requip  every day, a short-acting dose , currently at 3 PM. 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  Restless legs do not interfere with this . she is taking her extended release Requip around 8:30 at night she does not go to bed until 11 or 12 PM.   Interval history from 03/16/2016, Mrs. Standley had just had a visit with her primary care physician on 01/19/2016, Her CBC was in normal limits, cholesterol total was 171 mg/dL, HDL 47, glucose 95 normal creatinine at 1.2 mg/dL, white blood cell count 6.2, no sign of anemia, TSH was 1.87. She continues to experience restless legs but they do not impair her quality of life as they used to. According to the RLS 6 rating scale , which she endorsed it over the last 7 days and nights, she had no impairment in daytime and mild-to-moderate at nighttime. She has not had vertigo for several years now, she has kept her body weight stable, she is physically active. She is  here for routine reevaluation and refills on her medications. One of her question is that if her medications may cause restless legs or may promote restless legs. The only way to find out is to reduce the Requip dose and see if she is doing better. She is at this time not very impaired and there is a chance that she will have breakthrough activity in the reduce the dose. Since she is taking Requip at 2 different times of the day I would like for her to not take the morning dose. If after 8 days she feels that restless legs are breaking through I would want her to resume her previous medication regimen. We could also try to cut the  nighttime dose in half because she takes 2 tablets at bedtime.  Interval history from 01 April 2017, Mrs. Leuthold has regular lab test done through her primary care physician she is here today for refills of Requip medication that she has taken twice a day to control restless legs. She has found out that Prilosec interfered with the efficacy of Requip and has stopped taking it.  She feels that her restless legs are better controlled.  She still has a glass of wine in the evening.   Observations/Objective: Alert, answers questions appropriately, very pleasant, speech is clear and concise, follows commands, facial symmetry noted, gait is intact  Assessment and Plan: 1.  Restless leg syndrome  Ms. Mccabe is a delightful 78 year old woman. Overall she is doing quite well.  She will continue taking ropinirole (Requip XL) 2 mg 24 tablets, taking 2 tablets at bedtime.  I will send in a refill.  We discussed the importance of physical activity, avoiding caffeine with RLS.  She will follow-up in the office with Dr. Brett Fairy 05/01/2019 at 1:30 pm.   Follow Up Instructions: I will make her a follow-up appointment in May 01, 2019 at 1:30 PM   I discussed the assessment and treatment plan with the patient. The patient was provided an opportunity to ask questions and all were answered. The patient agreed with the plan and demonstrated an understanding of the instructions.   The patient was advised to call back or seek an in-person evaluation if the symptoms worsen or if the condition fails to improve as anticipated.  I provided 20 minutes of non-face-to-face time during this encounter.   Evangeline Dakin, DNP  [facility] [address], [place], [place] [address] [phone]

## 2019-05-01 ENCOUNTER — Other Ambulatory Visit: Payer: Self-pay

## 2019-05-01 ENCOUNTER — Encounter: Payer: Self-pay | Admitting: Neurology

## 2019-05-01 ENCOUNTER — Ambulatory Visit: Payer: PPO | Admitting: Neurology

## 2019-05-01 VITALS — BP 151/75 | HR 73 | Temp 96.2°F | Ht 59.0 in | Wt 125.0 lb

## 2019-05-01 DIAGNOSIS — D6489 Other specified anemias: Secondary | ICD-10-CM

## 2019-05-01 DIAGNOSIS — Z79899 Other long term (current) drug therapy: Secondary | ICD-10-CM

## 2019-05-01 DIAGNOSIS — Z9289 Personal history of other medical treatment: Secondary | ICD-10-CM | POA: Diagnosis not present

## 2019-05-01 DIAGNOSIS — T84011A Broken internal left hip prosthesis, initial encounter: Secondary | ICD-10-CM | POA: Diagnosis not present

## 2019-05-01 DIAGNOSIS — D62 Acute posthemorrhagic anemia: Secondary | ICD-10-CM

## 2019-05-01 DIAGNOSIS — G2581 Restless legs syndrome: Secondary | ICD-10-CM

## 2019-05-01 MED ORDER — ROPINIROLE HCL ER 2 MG PO TB24: ORAL_TABLET | ORAL | 3 refills | Status: DC

## 2019-05-01 NOTE — Progress Notes (Signed)
[facility]  PATIENT: Nancy Baxter DOB: 13-Feb-1941   REASON FOR VISIT:Followup for restless legs  HISTORY OF PRESENT ILLNESS:  05-01-2019, Nancy Baxter is 79 years old, and just lost her husband, who was healthy until the month before his death. He passed on 23-Dec-2019.  Nancy Baxter was diagnosed with a ascending aortic aneurysm, the size of the sweet potato.  He was admitted to hospital for further work-up, had multiple tests 4 weeks prior to the planned surgery which then took place in August.  Following the surgery and the very next day he suffered a stroke, then seizures, then he lost circulation to his legs which became gangrenous, he also started bleeding liver liver and kidney were impaired and he finally died after the couple have refused for him to have a tracheostomy and a permanent feeding tube.  I am very sorry to hear these news, also because Nancy Baxter was the only person allowed to be at her husband's bedside and she is certainly missed her family support and embrace. She just had her first Covid 19 vaccine. She has come to terms with his death, but not with the circumstances.  She is learning to handel the finances, at times overwhelmed.  RLS have been not her main problem.     Nancy Baxter, a 79 year old female returns for follow up.  She claims that generic  Requip XL is not  working for her. We provided brand name, and she felt not better .  She says she can take the medicine and her legs begin to be restless. She noticed that a glass of red wine has also a detrimental affect. Prilosec seemed to decrease the effect, too. She continues with short acting dose in the afternoon. She has not given up any activities, she remains very active.  She returns for reevaluation.    HISTORY: decade history of restless legs, and found relief from Requip - responded very well to Requip XL and stated " it gave me my life back"/  she recently observed that  the generic Requip may trigger some restless legs in the earlier day time .  has no other problems, she takes an additional dose of her Requip  every day, a short-acting dose , currently at 3 PM. 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  Restless legs do not interfere with this . she is taking her extended release Requip around 8:30 at night she does not go to bed until 11 or 12 PM.   Interval history from 03/16/2016, Nancy Baxter had just had a visit with her primary care physician on 01/19/2016, Her CBC was in normal limits, cholesterol total was 171 mg/dL, HDL 47, glucose 95 normal creatinine at 1.2 mg/dL, white blood cell count 6.2, no sign of anemia, TSH was 1.87. She continues to experience restless legs but they do not impair her quality of life as they used to. According to the RLS 6 rating scale , which she endorsed it over the last 7 days and nights, she had no impairment in daytime and mild-to-moderate at nighttime. She has not had vertigo for several years now, she has kept her body weight stable, she is physically active. She is here for routine reevaluation and refills on her medications. One of her question is that if her medications may cause restless legs or may promote restless legs. The only way to find out is to reduce the  Requip dose and see if she is doing better. She is at this time not very impaired and there is a chance that she will have breakthrough activity in the reduce the dose. Since she is taking Requip at 2 different times of the day I would like for her to not take the morning dose. If after 8 days she feels that restless legs are breaking through I would want her to resume her previous medication regimen. We could also try to cut the nighttime dose in half because she takes 2 tablets at bedtime.  Interval history from 01 April 2017, Nancy Baxter has regular lab test done through her primary care physician she is here today for  refills of Requip medication that she has taken twice a day to control restless legs. She has found out that Prilosec interfered with the efficacy of Requip and has stopped taking it.  She feels that her restless legs are better controlled.  She still has a glass of wine in the evening.    REVIEW OF SYSTEMS: Full 14 system review of systems performed and notable only for those listed, all others are neg:  Anxiety, grief.  Musculoskeletal:back pain, third hip replacement. Titanium hip corroded. Now ceramic .   Sleep restless legs- beginning earlier 3 PM.   Still responsive to Requip.    ALLERGIES: Allergies  Allergen Reactions  . Sulfa Antibiotics Hives  . Codeine Nausea And Vomiting  . Prednisone Other (See Comments)    Shuts down adrenals  . Zolpidem Tartrate Other (See Comments)    sleepwalks  . Adhesive [Tape] Rash    blisters    HOME MEDICATIONS: Outpatient Medications Prior to Visit  Medication Sig Dispense Refill  . calcium carbonate (OS-CAL) 1250 MG chewable tablet Chew 1 tablet by mouth daily.    . ferrous fumarate (HEMOCYTE - 106 MG FE) 325 (106 FE) MG TABS Take 37 mg of iron by mouth 2 (two) times a week. Pt takes two times weekly - Monday and thursdays    . Fish Oil-Cholecalciferol (FISH OIL + D3 PO) Take 1,000 mg by mouth daily.     . furosemide (LASIX) 20 MG tablet 20 mg daily. Takes two tabs daily  2  . losartan (COZAAR) 50 MG tablet Take 50 mg by mouth every morning.     . metoprolol (TOPROL-XL) 50 MG 24 hr tablet Take 50 mg by mouth every morning.     Marland Kitchen omeprazole (PRILOSEC) 20 MG capsule Take 20 mg by mouth daily as needed.     Marland Kitchen rOPINIRole (REQUIP XL) 2 MG 24 hr tablet Take 2 tablets (4 mg total) by mouth at bedtime. 180 tablet 3   No facility-administered medications prior to visit.    PAST MEDICAL HISTORY: Past Medical History:  Diagnosis Date  . Anemia   . Arthritis   . Complication of anesthesia   . GERD (gastroesophageal reflux disease)   . Heart  murmur    evaluated 94yr ago-no cardiac follow up needed  . Hypertension   . PONV (postoperative nausea and vomiting)     PAST SURGICAL HISTORY: Past Surgical History:  Procedure Laterality Date  . BACK SURGERY  1961   spinal fusion age   . CARPAL TUNNEL RELEASE Left 05/18/2016   Procedure: LEFT CARPAL TUNNEL RELEASE;  Surgeon: Daryll Brod, MD;  Location: [place];  Service: Orthopedics;  Laterality: Left;  . EAR CYST EXCISION  02/23/2011  . FOOT ARTHRODESIS  5/12   hammer toes,bunionectomy  .  JOINT REPLACEMENT  2006   rt hip  . JOINT REPLACEMENT  2008   lt hip  . TONSILLECTOMY     age 15  . TOTAL HIP REVISION  01/05/2012   Procedure: TOTAL HIP REVISION;  Surgeon: Gearlean Alf, MD;  Location: [facility];  Service: Orthopedics;  Laterality: Left;  . TRIGGER FINGER RELEASE  02/23/2011   Procedure: RELEASE TRIGGER FINGER/A-1 PULLEY;  Surgeon: Wynonia Sours, MD;  Location: [place];  Service: Orthopedics;  Laterality: Right;  . TRIGGER FINGER RELEASE Left 07/26/2012   Procedure: RELEASE TRIGGER FINGER/A-1 PULLEY LEFT SMALL FINGER;  Surgeon: Wynonia Sours, MD;  Location: [place];  Service: Orthopedics;  Laterality: Left;  . TRIGGER FINGER RELEASE Left 05/18/2016   Procedure: RELEASE TRIGGER FINGER/A-1 PULLEY LEFT INDEX;  Surgeon: Daryll Brod, MD;  Location: [place];  Service: Orthopedics;  Laterality: Left;  Marland Kitchen VAGINAL HYSTERECTOMY      FAMILY HISTORY: Family History  Problem Relation Age of Onset  . Colon cancer Neg Hx   . Esophageal cancer Neg Hx   . Rectal cancer Neg Hx   . Stomach cancer Neg Hx     SOCIAL HISTORY: Social History   Socioeconomic History  . Marital status: Married    Spouse name: [name]   . Number of children: 5  . Years of education: College  . Highest education level: Not on file  Occupational History    Comment: retired  Tobacco Use  . Smoking status: Never Smoker  . Smokeless tobacco:  Never Used  Substance and Sexual Activity  . Alcohol use: Yes    Comment: occasional, 03/17/15 infrequently  . Drug use: No  . Sexual activity: Not on file  Other Topics Concern  . Not on file  Social History Narrative   Patient is retired and married. [name])   Patient has some college education.    Patient has 5 children.    Right handed   Caffeine two cups of caffeine daily ( coffee)   Social Determinants of Health   Financial Resource Strain:   . Difficulty of Paying Living Expenses: Not on file  Food Insecurity:   . Worried About Charity fundraiser in the Last Year: Not on file  . Ran Out of Food in the Last Year: Not on file  Transportation Needs:   . Lack of Transportation (Medical): Not on file  . Lack of Transportation (Non-Medical): Not on file  Physical Activity:   . Days of Exercise per Week: Not on file  . Minutes of Exercise per Session: Not on file  Stress:   . Feeling of Stress : Not on file  Social Connections:   . Frequency of Communication with Friends and Family: Not on file  . Frequency of Social Gatherings with Friends and Family: Not on file  . Attends Religious Services: Not on file  . Active Member of Clubs or Organizations: Not on file  . Attends Archivist Meetings: Not on file  . Marital Status: Not on file  Intimate Partner Violence:   . Fear of Current or Ex-Partner: Not on file  . Emotionally Abused: Not on file  . Physically Abused: Not on file  . Sexually Abused: Not on file     PHYSICAL EXAM  Vitals:   05/01/19 1328  BP: (!) 151/75  Pulse: 73  Temp: (!) 96.2 F (35.7 C)  Weight: 125 lb (56.7 kg)  Height: 4\' 11"  (1.499 m)  Body mass index is 25.25 kg/m.  Generalized: Well developed, in no acute distress  Neurological examination  Mentation: Alert oriented to time, place, history taking. Follows all commands speech and language fluent Cranial nerve -  no change of taste or smell.  Pupils were equal round  reactive to light extraocular movements were full, visual field were full. Facial sensation and strength were normal. hearing was intact to finger rubbing bilaterally. Uvula and tongue moved midline. No fasciculation, no tremor.  head turning and shoulder shrug were symmetric. Motor: Normal bulk and tone, full strength - fine finger movements normal, no pronator drift. No focal weakness in the upper extremities.  Coordination: finger-nose-finger, neither ataxia, not tremor, nor dysmetria Reflexes: 1+ upper lower and symmetric  DIAGNOSTIC DATA (LABS, IMAGING, TESTING)  None available.   ASSESSMENT AND PLAN:  79 y.o. year old female with acute onset of restless legs after first hip surgery 11 years ago. Since than moderate relief on Requip. Here to follow up yearly.  Increased anxiety and grief have affected sleep.  She was switched to generic Requip XL by the pharmacy and it is not working. We discussed the Nupro patch.  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  Patient has more symptoms on generic Requip XL, but due to costs remained on generic. Ropinirole.   Neupro patch discussed was never tried , due to costs.   F/U q 12 months with either NP or me.    Larey Seat, MD   05-01-2019.    [facility] [address], [place], [place] [address] [phone]     Cc Dr Virgina Jock,

## 2019-05-01 NOTE — Patient Instructions (Signed)

## 2019-05-02 LAB — COMPREHENSIVE METABOLIC PANEL
ALT: 15 IU/L (ref 0–32)
AST: 24 IU/L (ref 0–40)
Albumin/Globulin Ratio: 1.5 (ref 1.2–2.2)
Albumin: 4.1 g/dL (ref 3.7–4.7)
Alkaline Phosphatase: 111 IU/L (ref 39–117)
BUN/Creatinine Ratio: 28 (ref 12–28)
BUN: 35 mg/dL — ABNORMAL HIGH (ref 8–27)
Bilirubin Total: 0.5 mg/dL (ref 0.0–1.2)
CO2: 22 mmol/L (ref 20–29)
Calcium: 9.5 mg/dL (ref 8.7–10.3)
Chloride: 100 mmol/L (ref 96–106)
Creatinine, Ser: 1.26 mg/dL — ABNORMAL HIGH (ref 0.57–1.00)
GFR calc Af Amer: 47 mL/min/{1.73_m2} — ABNORMAL LOW (ref >59)
GFR calc non Af Amer: 41 mL/min/{1.73_m2} — ABNORMAL LOW (ref >59)
Globulin, Total: 2.8 g/dL (ref 1.5–4.5)
Glucose: 113 mg/dL — ABNORMAL HIGH (ref 65–99)
Potassium: 3.9 mmol/L (ref 3.5–5.2)
Sodium: 138 mmol/L (ref 134–144)
Total Protein: 6.9 g/dL (ref 6.0–8.5)

## 2019-05-02 LAB — CBC WITH DIFFERENTIAL/PLATELET
Basophils Absolute: 0 10*3/uL (ref 0.0–0.2)
Basos: 1 %
EOS (ABSOLUTE): 0.1 10*3/uL (ref 0.0–0.4)
Eos: 1 %
Hematocrit: 42.9 % (ref 34.0–46.6)
Hemoglobin: 14 g/dL (ref 11.1–15.9)
Immature Grans (Abs): 0 10*3/uL (ref 0.0–0.1)
Immature Granulocytes: 0 %
Lymphocytes Absolute: 2.4 10*3/uL (ref 0.7–3.1)
Lymphs: 39 %
MCH: 30.4 pg (ref 26.6–33.0)
MCHC: 32.6 g/dL (ref 31.5–35.7)
MCV: 93 fL (ref 79–97)
Monocytes Absolute: 0.5 10*3/uL (ref 0.1–0.9)
Monocytes: 9 %
Neutrophils Absolute: 3.1 10*3/uL (ref 1.4–7.0)
Neutrophils: 50 %
Platelets: 168 10*3/uL (ref 150–450)
RBC: 4.61 x10E6/uL (ref 3.77–5.28)
RDW: 12.2 % (ref 11.7–15.4)
WBC: 6.1 10*3/uL (ref 3.4–10.8)

## 2019-05-02 LAB — IRON,TIBC AND FERRITIN PANEL
Ferritin: 150 ng/mL (ref 15–150)
Iron Saturation: 36 % (ref 15–55)
Iron: 107 ug/dL (ref 27–139)
Total Iron Binding Capacity: 297 ug/dL (ref 250–450)
UIBC: 190 ug/dL (ref 118–369)

## 2019-05-21 DIAGNOSIS — I10 Essential (primary) hypertension: Secondary | ICD-10-CM | POA: Diagnosis not present

## 2019-05-28 DIAGNOSIS — I872 Venous insufficiency (chronic) (peripheral): Secondary | ICD-10-CM | POA: Diagnosis not present

## 2019-05-28 DIAGNOSIS — G5601 Carpal tunnel syndrome, right upper limb: Secondary | ICD-10-CM | POA: Diagnosis not present

## 2019-05-28 DIAGNOSIS — Z1331 Encounter for screening for depression: Secondary | ICD-10-CM | POA: Diagnosis not present

## 2019-05-28 DIAGNOSIS — L989 Disorder of the skin and subcutaneous tissue, unspecified: Secondary | ICD-10-CM | POA: Diagnosis not present

## 2019-05-28 DIAGNOSIS — Z0000 Encounter for general adult medical examination without abnormal findings: Secondary | ICD-10-CM | POA: Diagnosis not present

## 2019-05-28 DIAGNOSIS — K219 Gastro-esophageal reflux disease without esophagitis: Secondary | ICD-10-CM | POA: Diagnosis not present

## 2019-05-28 DIAGNOSIS — M419 Scoliosis, unspecified: Secondary | ICD-10-CM | POA: Diagnosis not present

## 2019-05-28 DIAGNOSIS — R197 Diarrhea, unspecified: Secondary | ICD-10-CM | POA: Diagnosis not present

## 2019-05-28 DIAGNOSIS — M1990 Unspecified osteoarthritis, unspecified site: Secondary | ICD-10-CM | POA: Diagnosis not present

## 2019-05-28 DIAGNOSIS — N1831 Chronic kidney disease, stage 3a: Secondary | ICD-10-CM | POA: Diagnosis not present

## 2019-05-28 DIAGNOSIS — I8390 Asymptomatic varicose veins of unspecified lower extremity: Secondary | ICD-10-CM | POA: Diagnosis not present

## 2019-05-28 DIAGNOSIS — G2581 Restless legs syndrome: Secondary | ICD-10-CM | POA: Diagnosis not present

## 2019-05-28 DIAGNOSIS — I129 Hypertensive chronic kidney disease with stage 1 through stage 4 chronic kidney disease, or unspecified chronic kidney disease: Secondary | ICD-10-CM | POA: Diagnosis not present

## 2019-07-31 DIAGNOSIS — Z01419 Encounter for gynecological examination (general) (routine) without abnormal findings: Secondary | ICD-10-CM | POA: Diagnosis not present

## 2019-07-31 DIAGNOSIS — Z6824 Body mass index (BMI) 24.0-24.9, adult: Secondary | ICD-10-CM | POA: Diagnosis not present

## 2019-07-31 DIAGNOSIS — Z1231 Encounter for screening mammogram for malignant neoplasm of breast: Secondary | ICD-10-CM | POA: Diagnosis not present

## 2019-07-31 DIAGNOSIS — F329 Major depressive disorder, single episode, unspecified: Secondary | ICD-10-CM | POA: Diagnosis not present

## 2019-10-04 ENCOUNTER — Encounter: Payer: Self-pay | Admitting: Neurology

## 2019-10-04 NOTE — Telephone Encounter (Signed)
Thank you -  I forward this to the sleep lab. CD ===View-only below this line===   ----- Message -----      Lana Fish      Sent:10/04/2019  5:14 PM EDT        ZC:HYIFOY Daysi Boggan, MD   Subject:Covid-19 vacinations  Moderna 04/25/2019  [employer]  Lot#[id number] Moderna 05/23/2019  [employer]   [employer]

## 2019-10-29 ENCOUNTER — Emergency Department (HOSPITAL_COMMUNITY)
Admission: EM | Admit: 2019-10-29 | Discharge: 2019-10-29 | Disposition: A | Discharge status: Home / Self Care | Payer: PPO | Attending: Emergency Medicine | Admitting: Emergency Medicine

## 2019-10-29 ENCOUNTER — Other Ambulatory Visit: Payer: Self-pay

## 2019-10-29 ENCOUNTER — Encounter (HOSPITAL_COMMUNITY): Payer: Self-pay | Admitting: *Deleted

## 2019-10-29 DIAGNOSIS — Z5321 Procedure and treatment not carried out due to patient leaving prior to being seen by health care provider: Secondary | ICD-10-CM | POA: Insufficient documentation

## 2019-10-29 DIAGNOSIS — R2232 Localized swelling, mass and lump, left upper limb: Secondary | ICD-10-CM | POA: Insufficient documentation

## 2019-10-29 NOTE — ED Notes (Signed)
Pt  Was told the risk of leaving and said she can no longer wait

## 2019-10-29 NOTE — ED Triage Notes (Signed)
Pt reports having bee sting to left hand x 3 hours ago. Took benadryl but has moderate swelling to left ring finger and unable to get her ring off.

## 2020-04-30 ENCOUNTER — Ambulatory Visit: Payer: PPO | Admitting: Neurology

## 2020-04-30 ENCOUNTER — Encounter: Payer: Self-pay | Admitting: Neurology

## 2020-04-30 VITALS — BP 177/85 | HR 66 | Ht 59.0 in | Wt 118.0 lb

## 2020-04-30 DIAGNOSIS — I10 Essential (primary) hypertension: Secondary | ICD-10-CM

## 2020-04-30 DIAGNOSIS — G2581 Restless legs syndrome: Secondary | ICD-10-CM

## 2020-04-30 DIAGNOSIS — D62 Acute posthemorrhagic anemia: Secondary | ICD-10-CM | POA: Diagnosis not present

## 2020-04-30 MED ORDER — ROPINIROLE HCL ER 2 MG PO TB24: ORAL_TABLET | ORAL | 3 refills | Status: DC

## 2020-04-30 NOTE — Progress Notes (Signed)
Thank you -  I forward this to the sleep lab. CD ===View-only below this line===   ----- Message -----      Nancy Baxter      Sent:10/04/2019  5:14 PM EDT        ID:[id number] Nancy Mcjunkin, MD   Subject:Covid-19 vacinations  Moderna 04/25/2019  [employer]  Lot#[id number] Moderna 05/23/2019  [employer]   Lot#[id number]   [facility]  PATIENT: Nancy Baxter DOB: [birth year]   REASON FOR VISIT:Followup for restless legs  Interval History;   04-30-2020,Nancy Baxter has now celebrated her 73 birthday, she is here with a flair up of RLS. She believes this is related to her mattress. Her husband died of an aortic aneurysm - 38 month ago, a cruel death. She is concerned about a knot in her abdomen , palpation shows a mobil knot at the lower end of her stomach.  I encouraged her to show this to Dr Virgina Jock, probably needs an abdominal US.     05-01-2019, Nancy Baxter is 80 years old, and just lost her husband, who was healthy until the month before his death. He passed on January 16, 2020.  Mr. Hamburger was diagnosed with a ascending aortic aneurysm, the size of the sweet potato.  He was admitted to hospital for further work-up, had multiple tests 4 weeks prior to the planned surgery which then took place in August.  Following the surgery and the very next day he suffered a stroke, then seizures, then he lost circulation to his legs which became gangrenous, he also started bleeding liver liver and kidney were impaired and he finally died after the couple have refused for him to have a tracheostomy and a permanent feeding tube.  I am very sorry to hear these news, also because Nancy Baxter was the only person allowed to be at her husband's bedside and she is certainly missed her family support and embrace. She just had her first Covid 19 vaccine. She has come to terms with his death, but not with the circumstances.  She is learning to handel the finances, at times overwhelmed.  RLS  have been not her main problem.     Nancy Baxter, a 80 year old female returns for follow up.  She claims that generic  Requip XL is not  working for her. We provided brand name, and she felt not better .  She says she can take the medicine and her legs begin to be restless. She noticed that a glass of red wine has also a detrimental affect. Prilosec seemed to decrease the effect, too. She continues with short acting dose in the afternoon. She has not given up any activities, she remains very active.  She returns for reevaluation.    HISTORY: decade history of restless legs, and found relief from Requip - responded very well to Requip XL and stated " it gave me my life back"/  she recently observed that the generic Requip may trigger some restless legs in the earlier day time .  has no other problems, she takes an additional dose of her Requip  every day, a short-acting dose , currently at 3 PM. 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  Restless legs do not interfere with this . she is taking her extended release Requip around 8:30 at night she does not go to bed until 11 or 12 PM.   Interval history from 03/16/2016, Nancy Baxter had just had a  visit with her primary care physician on 01/19/2016, Her CBC was in normal limits, cholesterol total was 171 mg/dL, HDL 47, glucose 95 normal creatinine at 1.2 mg/dL, white blood cell count 6.2, no sign of anemia, TSH was 1.87. She continues to experience restless legs but they do not impair her quality of life as they used to. According to the RLS 6 rating scale , which she endorsed it over the last 7 days and nights, she had no impairment in daytime and mild-to-moderate at nighttime. She has not had vertigo for several years now, she has kept her body weight stable, she is physically active. She is here for routine reevaluation and refills on her medications. One of her question is that if her medications may cause  restless legs or may promote restless legs. The only way to find out is to reduce the Requip dose and see if she is doing better. She is at this time not very impaired and there is a chance that she will have breakthrough activity in the reduce the dose. Since she is taking Requip at 2 different times of the day I would like for her to not take the morning dose. If after 8 days she feels that restless legs are breaking through I would want her to resume her previous medication regimen. We could also try to cut the nighttime dose in half because she takes 2 tablets at bedtime.  Interval history from 01 April 2017, Nancy Baxter has regular lab test done through her primary care physician she is here today for refills of Requip medication that she has taken twice a day to control restless legs. She has found out that Prilosec interfered with the efficacy of Requip and has stopped taking it.  She feels that her restless legs are better controlled.  She still has a glass of wine in the evening.    REVIEW OF SYSTEMS: Full 14 system review of systems performed and notable only for those listed, all others are neg:  Anxiety, grief.  Musculoskeletal:back pain, third hip replacement. Titanium hip corroded. Now ceramic .   Sleep restless legs- beginning earlier 3 PM.   Still responsive to Requip.    ALLERGIES: Allergies  Allergen Reactions  . Sulfa Antibiotics Hives  . Codeine Nausea And Vomiting  . Prednisone Other (See Comments)    Shuts down adrenals  . Zolpidem Tartrate Other (See Comments)    sleepwalks  . Adhesive [Tape] Rash    blisters    HOME MEDICATIONS: Outpatient Medications Prior to Visit  Medication Sig Dispense Refill  . Acetaminophen (TYLENOL ARTHRITIS PAIN PO) Take by mouth daily as needed.    . calcium carbonate (OS-CAL) 1250 MG chewable tablet Chew 1 tablet by mouth daily.    . ferrous fumarate (HEMOCYTE - 106 MG FE) 325 (106 FE) MG TABS Take 37 mg of iron by mouth 2 (two)  times a week. Pt takes two times weekly - Monday and thursdays    . furosemide (LASIX) 20 MG tablet 20 mg daily. Takes two tabs daily  2  . losartan (COZAAR) 50 MG tablet Take 50 mg by mouth every morning.    . metoprolol (TOPROL-XL) 50 MG 24 hr tablet Take 50 mg by mouth every morning.    Marland Kitchen rOPINIRole (REQUIP XL) 2 MG 24 hr tablet Firs dose at 6 PM and second dose at bedtime. 180 tablet 3  . Turmeric 500 MG CAPS Take 1 capsule by mouth daily.    . vitamin  B-12 (CYANOCOBALAMIN) 1000 MCG tablet Take 1,000 mcg by mouth 2 (two) times daily.    Marland Kitchen VITAMIN D PO Take by mouth.    Marland Kitchen ZINC GLUCONATE PO Take 1 tablet by mouth daily.    Marland Kitchen Baxter Oil-Cholecalciferol (Baxter OIL + D3 PO) Take 1,000 mg by mouth daily.     Marland Kitchen omeprazole (PRILOSEC) 20 MG capsule Take 20 mg by mouth daily as needed.      No facility-administered medications prior to visit.    PAST MEDICAL HISTORY: Past Medical History:  Diagnosis Date  . Anemia   . Arthritis   . Complication of anesthesia   . GERD (gastroesophageal reflux disease)   . Heart murmur    evaluated 23yr ago-no cardiac follow up needed  . Hypertension   . PONV (postoperative nausea and vomiting)     PAST SURGICAL HISTORY: Past Surgical History:  Procedure Laterality Date  . BACK SURGERY  1961   spinal fusion age   . CARPAL TUNNEL RELEASE Left 05/18/2016   Procedure: LEFT CARPAL TUNNEL RELEASE;  Surgeon: Daryll Brod, MD;  Location: [place];  Service: Orthopedics;  Laterality: Left;  . EAR CYST EXCISION  02/23/2011  . FOOT ARTHRODESIS  5/12   hammer toes,bunionectomy  . JOINT REPLACEMENT  2006   rt hip  . JOINT REPLACEMENT  2008   lt hip  . TONSILLECTOMY     age 35  . TOTAL HIP REVISION  01/05/2012   Procedure: TOTAL HIP REVISION;  Surgeon: Gearlean Alf, MD;  Location: [facility];  Service: Orthopedics;  Laterality: Left;  . TRIGGER FINGER RELEASE  02/23/2011   Procedure: RELEASE TRIGGER FINGER/A-1 PULLEY;  Surgeon: Wynonia Sours, MD;   Location: [place];  Service: Orthopedics;  Laterality: Right;  . TRIGGER FINGER RELEASE Left 07/26/2012   Procedure: RELEASE TRIGGER FINGER/A-1 PULLEY LEFT SMALL FINGER;  Surgeon: Wynonia Sours, MD;  Location: [place];  Service: Orthopedics;  Laterality: Left;  . TRIGGER FINGER RELEASE Left 05/18/2016   Procedure: RELEASE TRIGGER FINGER/A-1 PULLEY LEFT INDEX;  Surgeon: Daryll Brod, MD;  Location: [place];  Service: Orthopedics;  Laterality: Left;  Marland Kitchen VAGINAL HYSTERECTOMY      FAMILY HISTORY: Family History  Problem Relation Age of Onset  . Colon cancer Neg Hx   . Esophageal cancer Neg Hx   . Rectal cancer Neg Hx   . Stomach cancer Neg Hx     SOCIAL HISTORY: Social History   Socioeconomic History  . Marital status: Widowed    Spouse name: [name]   . Number of children: 5  . Years of education: College  . Highest education level: Not on file  Occupational History    Comment: retired  Tobacco Use  . Smoking status: Never Smoker  . Smokeless tobacco: Never Used  Substance and Sexual Activity  . Alcohol use: Yes    Comment: occasional, 03/17/15 infrequently  . Drug use: No  . Sexual activity: Not on file  Other Topics Concern  . Not on file  Social History Narrative   Patient is retired and married. [name])   Patient has some college education.    Patient has 5 children.    Right handed   Caffeine two cups of caffeine daily ( coffee)   Social Determinants of Health   Financial Resource Strain: Not on file  Food Insecurity: Not on file  Transportation Needs: Not on file  Physical Activity: Not on file  Stress:  Not on file  Social Connections: Not on file  Intimate Partner Violence: Not on file     PHYSICAL EXAM  Vitals:   04/30/20 1423  BP: (!) 177/85  Pulse: 66  Weight: 118 lb (53.5 kg)  Height: 4\' 11"  (1.499 m)   Body mass index is 23.83 kg/m.  Generalized: Well developed, in no acute distress   Neurological examination  Mentation: Alert oriented to time, place, history taking. Follows all commands speech and language fluent Cranial nerve -  no change of taste or smell.  Pupils were equal round reactive to light extraocular movements were full, visual field were full. Facial sensation and strength were normal. hearing was intact to finger rubbing bilaterally. Uvula and tongue moved midline. No fasciculation, no tremor.  head turning and shoulder shrug were symmetric. Motor: Normal bulk and tone, full strength - fine finger movements normal, no pronator drift. No focal weakness in the upper extremities.  Coordination: finger-nose-finger, neither ataxia, not tremor, nor dysmetria Reflexes: 1+ upper lower and symmetric  DIAGNOSTIC DATA (LABS, IMAGING, TESTING)  None available.   ASSESSMENT AND PLAN:  80 y.o. year old female with acute onset of restless legs after first hip surgery 12.5 years ago.  Since than moderate relief on Requip. Here to follow up yearly.  Increased anxiety and grief have affected sleep, she is now widowed 34 month.  She was switched to generic Requip XL by the pharmacy and it is not working. We discussed the Nupro patch.  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  Patient has more symptoms on generic Requip XL, but due to costs remained on generic. Ropinirole.   Neupro patch discussed was never tried, due to costs.   2 mg XR at night, I offered her to take 2.  F/U q 12 months with me.    Larey Seat, MD   04-30-2020.    [facility] [address], [place], [place] [address] [phone]     Cc Dr Virgina Jock,

## 2020-04-30 NOTE — Patient Instructions (Signed)

## 2020-05-29 DIAGNOSIS — F4320 Adjustment disorder, unspecified: Secondary | ICD-10-CM | POA: Diagnosis not present

## 2020-05-29 DIAGNOSIS — N1831 Chronic kidney disease, stage 3a: Secondary | ICD-10-CM | POA: Diagnosis not present

## 2020-05-29 DIAGNOSIS — K219 Gastro-esophageal reflux disease without esophagitis: Secondary | ICD-10-CM | POA: Diagnosis not present

## 2020-05-29 DIAGNOSIS — I129 Hypertensive chronic kidney disease with stage 1 through stage 4 chronic kidney disease, or unspecified chronic kidney disease: Secondary | ICD-10-CM | POA: Diagnosis not present

## 2020-05-29 DIAGNOSIS — I872 Venous insufficiency (chronic) (peripheral): Secondary | ICD-10-CM | POA: Diagnosis not present

## 2020-05-29 DIAGNOSIS — G2581 Restless legs syndrome: Secondary | ICD-10-CM | POA: Diagnosis not present

## 2020-05-29 DIAGNOSIS — I8390 Asymptomatic varicose veins of unspecified lower extremity: Secondary | ICD-10-CM | POA: Diagnosis not present

## 2020-05-29 DIAGNOSIS — M1990 Unspecified osteoarthritis, unspecified site: Secondary | ICD-10-CM | POA: Diagnosis not present

## 2020-05-29 DIAGNOSIS — M419 Scoliosis, unspecified: Secondary | ICD-10-CM | POA: Diagnosis not present

## 2020-06-27 ENCOUNTER — Emergency Department (HOSPITAL_COMMUNITY)
Admission: EM | Admit: 2020-06-27 | Discharge: 2020-06-27 | Disposition: A | Discharge status: Home / Self Care | Payer: PPO | Attending: Emergency Medicine | Admitting: Emergency Medicine

## 2020-06-27 ENCOUNTER — Emergency Department (HOSPITAL_COMMUNITY): Payer: PPO

## 2020-06-27 ENCOUNTER — Other Ambulatory Visit: Payer: Self-pay

## 2020-06-27 DIAGNOSIS — Z96643 Presence of artificial hip joint, bilateral: Secondary | ICD-10-CM | POA: Insufficient documentation

## 2020-06-27 DIAGNOSIS — Z79899 Other long term (current) drug therapy: Secondary | ICD-10-CM | POA: Diagnosis not present

## 2020-06-27 DIAGNOSIS — X501XXA Overexertion from prolonged static or awkward postures, initial encounter: Secondary | ICD-10-CM | POA: Insufficient documentation

## 2020-06-27 DIAGNOSIS — Z96642 Presence of left artificial hip joint: Secondary | ICD-10-CM | POA: Diagnosis not present

## 2020-06-27 DIAGNOSIS — S838X2A Sprain of other specified parts of left knee, initial encounter: Secondary | ICD-10-CM | POA: Diagnosis not present

## 2020-06-27 DIAGNOSIS — T84021A Dislocation of internal left hip prosthesis, initial encounter: Secondary | ICD-10-CM | POA: Diagnosis not present

## 2020-06-27 DIAGNOSIS — I10 Essential (primary) hypertension: Secondary | ICD-10-CM | POA: Insufficient documentation

## 2020-06-27 DIAGNOSIS — S79912A Unspecified injury of left hip, initial encounter: Secondary | ICD-10-CM | POA: Diagnosis present

## 2020-06-27 DIAGNOSIS — S73005A Unspecified dislocation of left hip, initial encounter: Secondary | ICD-10-CM | POA: Insufficient documentation

## 2020-06-27 DIAGNOSIS — M25559 Pain in unspecified hip: Secondary | ICD-10-CM

## 2020-06-27 DIAGNOSIS — Z471 Aftercare following joint replacement surgery: Secondary | ICD-10-CM | POA: Diagnosis not present

## 2020-06-27 MED ORDER — PROPOFOL 10 MG/ML IV BOLUS
INTRAVENOUS | Status: AC | PRN
  Administered 2020-06-27: 50 mg via INTRAVENOUS
  Administered 2020-06-27: 90 mg via INTRAVENOUS

## 2020-06-27 MED ORDER — PROPOFOL 10 MG/ML IV BOLUS
100.0000 mg | Freq: Once | INTRAVENOUS | Status: DC
  Filled 2020-06-27: qty 20

## 2020-06-27 MED ORDER — FENTANYL CITRATE (PF) 100 MCG/2ML IJ SOLN
50.0000 ug | Freq: Once | INTRAMUSCULAR | Status: AC
  Administered 2020-06-27: 50 ug via INTRAVENOUS
  Filled 2020-06-27: qty 2

## 2020-06-27 NOTE — ED Provider Notes (Signed)
[facility] DEPT Provider Note   CSN: [id number] Arrival date & time: 06/27/20  1707     History Chief Complaint  Patient presents with  . Hip Pain    Nancy Baxter is a 80 y.o. female.  HPI   Patient with no significant medical history presents with chief complaint of dislocation of her left hip.  She endorses today she had chocolate on her shoe which she try to wipe off as she bent over she felt her left hip pop out.  She had severe pain in her hip can not apply any weight to it.  She denies paresthesias or weakness in that leg, denies hitting her head, losing conscious, is not on anticoagulant.  She endorses that she had her hip replaced approximately 5 years ago, has never had this happen to her in the past.  She denies  alleviating factors.  Patient denies headaches, fevers, chills, shortness breath, chest pain, abdominal pain, nausea, vomiting, diarrhea, worsening pedal edema.  Past Medical History:  Diagnosis Date  . Anemia   . Arthritis   . Complication of anesthesia   . GERD (gastroesophageal reflux disease)   . Heart murmur    evaluated 60yr ago-no cardiac follow up needed  . Hypertension   . PONV (postoperative nausea and vomiting)     Patient Active Problem List   Diagnosis Date Noted  . Primary hypertension 04/30/2020  . Restless legs syndrome (RLS) 12/25/2012  . Lumbosacral root lesions, not elsewhere classified 12/25/2012  . Postop Transfusion 01/07/2012  . Postop Acute blood loss anemia 01/06/2012  . Postop Hyponatremia 01/06/2012  . Failed total hip arthroplasty ([place]) 01/05/2012    Past Surgical History:  Procedure Laterality Date  . BACK SURGERY  1961   spinal fusion age   . CARPAL TUNNEL RELEASE Left 05/18/2016   Procedure: LEFT CARPAL TUNNEL RELEASE;  Surgeon: Daryll Brod, MD;  Location: [facility];  Service: Orthopedics;  Laterality: Left;  . EAR CYST EXCISION  02/23/2011  . FOOT ARTHRODESIS  5/12    hammer toes,bunionectomy  . JOINT REPLACEMENT  2006   rt hip  . JOINT REPLACEMENT  2008   lt hip  . TONSILLECTOMY     age 43  . TOTAL HIP REVISION  01/05/2012   Procedure: TOTAL HIP REVISION;  Surgeon: Gearlean Alf, MD;  Location: [facility];  Service: Orthopedics;  Laterality: Left;  . TRIGGER FINGER RELEASE  02/23/2011   Procedure: RELEASE TRIGGER FINGER/A-1 PULLEY;  Surgeon: Wynonia Sours, MD;  Location: [place] [facility] [place];  Service: Orthopedics;  Laterality: Right;  . TRIGGER FINGER RELEASE Left 07/26/2012   Procedure: RELEASE TRIGGER FINGER/A-1 PULLEY LEFT SMALL FINGER;  Surgeon: Wynonia Sours, MD;  Location: [place];  Service: Orthopedics;  Laterality: Left;  . TRIGGER FINGER RELEASE Left 05/18/2016   Procedure: RELEASE TRIGGER FINGER/A-1 PULLEY LEFT INDEX;  Surgeon: Daryll Brod, MD;  Location: [facility];  Service: Orthopedics;  Laterality: Left;  Marland Kitchen VAGINAL HYSTERECTOMY       OB History   No obstetric history on file.     Family History  Problem Relation Age of Onset  . Colon cancer Neg Hx   . Esophageal cancer Neg Hx   . Rectal cancer Neg Hx   . Stomach cancer Neg Hx     Social History   Tobacco Use  . Smoking status: Never Smoker  . Smokeless tobacco: Never Used  Substance Use Topics  .  Alcohol use: Yes    Comment: occasional, 03/17/15 infrequently  . Drug use: No    Home Medications Prior to Admission medications   Medication Sig Start Date End Date Taking? Authorizing Provider  Acetaminophen (TYLENOL ARTHRITIS PAIN PO) Take by mouth daily as needed.    [provider]  calcium carbonate (OS-CAL) 1250 MG chewable tablet Chew 1 tablet by mouth daily.    [provider]  ferrous fumarate (HEMOCYTE - 106 MG FE) 325 (106 FE) MG TABS Take 37 mg of iron by mouth 2 (two) times a week. Pt takes two times weekly - Monday and thursdays    [provider]  furosemide (LASIX) 20 MG tablet 20 mg daily. Takes two  tabs daily 02/25/15   [provider]  losartan (COZAAR) 50 MG tablet Take 50 mg by mouth every morning.    [provider]  metoprolol (TOPROL-XL) 50 MG 24 hr tablet Take 50 mg by mouth every morning.    [provider]  rOPINIRole (REQUIP XL) 2 MG 24 hr tablet Firs dose at 6 PM and second dose at bedtime. 04/30/20   Dohmeier, Asencion Partridge, MD  Turmeric 500 MG CAPS Take 1 capsule by mouth daily.    [provider]  vitamin B-12 (CYANOCOBALAMIN) 1000 MCG tablet Take 1,000 mcg by mouth 2 (two) times daily.    [provider]  VITAMIN D PO Take by mouth.    [provider]  ZINC GLUCONATE PO Take 1 tablet by mouth daily.    [provider]    Allergies    Sulfa antibiotics, Codeine, Prednisone, Zolpidem tartrate, and Adhesive [tape]  Review of Systems   Review of Systems  Constitutional: Negative for chills and fever.  HENT: Negative for congestion and tinnitus.   Respiratory: Negative for shortness of breath.   Cardiovascular: Negative for chest pain.  Gastrointestinal: Negative for abdominal pain, diarrhea, nausea and vomiting.  Genitourinary: Negative for enuresis.  Musculoskeletal: Negative for back pain.       Left hip pain  Skin: Negative for rash.  Neurological: Negative for dizziness and headaches.  Hematological: Does not bruise/bleed easily.    Physical Exam Updated Vital Signs BP (!) 179/84   Pulse 70   Temp 98.3 F (36.8 C) (Oral)   Resp 16   Ht 4\' 11"  (1.499 m)   Wt 53.5 kg   SpO2 99%   BMI 23.83 kg/m   Physical Exam Vitals and nursing note reviewed.  Constitutional:      General: She is not in acute distress.    Appearance: She is not ill-appearing.  HENT:     Head: Normocephalic and atraumatic.     Nose: No congestion.  Eyes:     Conjunctiva/sclera: Conjunctivae normal.  Cardiovascular:     Rate and Rhythm: Normal rate and regular rhythm.     Pulses: Normal pulses.     Heart sounds: No murmur  heard. No friction rub. No gallop.   Pulmonary:     Effort: No respiratory distress.     Breath sounds: No wheezing, rhonchi or rales.  Musculoskeletal:        General: Tenderness present.     Right lower leg: No edema.     Left lower leg: No edema.     Comments: Patient's left hip was visualized there is no gross deformities present, she was tender to palpation along her hip, she had full range of motion at her toes, ankle and knee, unable to flex  or extend at the hip.  Neurovascular fully intact in her left leg.  Skin:    General: Skin is warm and dry.  Neurological:     Mental Status: She is alert.  Psychiatric:        Mood and Affect: Mood normal.     ED Results / Procedures / Treatments   Labs (all labs ordered are listed, but only abnormal results are displayed) Labs Reviewed - No data to display  EKG None  Radiology DG HIP PORT UNILAT WITH PELVIS 1V LEFT  Result Date: 06/27/2020 CLINICAL DATA:  The patient felt a pop in her left hip when she bent over today. EXAM: DG HIP (WITH OR WITHOUT PELVIS) 1V PORT LEFT COMPARISON:  Single view of the left hip 01/05/2012. FINDINGS: The patient has a left hip arthroplasty. The femoral head is dislocated. No acute bony abnormality is seen. IMPRESSION: Dislocated left hip replacement. Electronically Signed   By: Inge Rise M.D.   On: 06/27/2020 18:32   DG Hip Unilat W or Wo Pelvis 2-3 Views Left  Result Date: 06/27/2020 CLINICAL DATA:  Post hip reduction EXAM: DG HIP (WITH OR WITHOUT PELVIS) 2-3V LEFT COMPARISON:  None. FINDINGS: Interval reduction of the previously seen dislocated left hip replacement. Normal AP alignment. Soft tissue calcification superior to the femoral neck likely reflects heterotopic bone formation. No fracture. IMPRESSION: Interval reduction.  Normal AP alignment. Electronically Signed   By: Rolm Baptise M.D.   On: 06/27/2020 20:26    Procedures Reduction of dislocation  Date/Time: 06/27/2020 8:18  PM Performed by: Marcello Fennel, PA-C Authorized by: Marcello Fennel, PA-C  Consent: Verbal consent obtained. Written consent obtained. Risks and benefits: risks, benefits and alternatives were discussed Consent given by: patient Patient understanding: patient states understanding of the procedure being performed Patient identity confirmed: verbally with patient Time out: Immediately prior to procedure a "time out" was called to verify the correct patient, procedure, equipment, support staff and site/side marked as required. Preparation: Patient was prepped and draped in the usual sterile fashion. Patient tolerance: patient tolerated the procedure well with no immediate complications Comments: Patient's left leg was neurovascularly intact good pedal pulses palpated      Medications Ordered in ED Medications  propofol (DIPRIVAN) 10 mg/mL bolus/IV push 100 mg (0 mg Intravenous Hold 06/27/20 2001)  fentaNYL (SUBLIMAZE) injection 50 mcg (50 mcg Intravenous Given 06/27/20 1815)  propofol (DIPRIVAN) 10 mg/mL bolus/IV push (50 mg Intravenous Given 06/27/20 2010)    ED Course  I have reviewed the triage vital signs and the nursing notes.  Pertinent labs & imaging results that were available during my care of the patient were reviewed by me and considered in my medical decision making (see chart for details).    MDM Rules/Calculators/A&P                         Initial impression-patient presents with complaint of dislocated hip.  She is alert, does not appear in acute distress, vital signs reassuring.  Concern for dislocation will obtain x-ray, provide patient with antiemetics medications and reassess.  Work-up-x-ray reveals dislocation of the left hip replacement  Repeat left hip x-ray shows reduction of dislocated hip no signs of fracture present.  Reassessment due to dislocation will recommend reduction to help alleviate pain.  Patient is agreeable to this, discussed risk and  benefits of conscious sedation patient is agreeable for procedure.  Sedation was performed by Dr. Ron Parker, with respiratory,  and Ortho techs available sedation was performed and reduction was successful.  Leg was placed in a splint, neurovascular intact after procedure.  Patient is alert at this time vital signs are remained stable.  will continue to monitor for 1 hour and discharge  Patient was reassessed, continues to have no pain at this time, vital signs have remained stable, patient is agreeable for discharge at this time.  Rule out- Low suspicion for fracture or dislocation as repeat imaging x-ray negative for acute findings. low suspicion for ligament or tendon damage as area was palpated no gross defects noted.   Low suspicion for compartment syndrome as area was palpated it was soft to the touch, neurovascular fully intact.  Plan-patient's left hip was successfully reduced, patient in a splint, recommend using a walker while ambulating follow-up with Ortho for further evaluation.  Vital signs have remained stable, no indication for hospital admission.  Patient discussed with attending and they agreed with assessment and plan.  Patient given at home care as well strict return precautions.  Patient verbalized that they understood agreed to said plan.   Final Clinical Impression(s) / ED Diagnoses Final diagnoses:  Dislocation of left hip, initial encounter [facility])    Rx / DC Orders ED Discharge Orders    None       Aron Baba 06/27/20 2121    Breck Coons, MD 06/29/20 [phone]

## 2020-06-27 NOTE — Sedation Documentation (Signed)
Desired effect achieved from medication. Hip dislocation reduction in process.

## 2020-06-27 NOTE — ED Notes (Signed)
ETCO2 applied on pt in preparation for conscious sedation. Ortho tech, RTT, RN x 2, PA, MD present for sedation. Pt and support person updated. Airway cart and code cart present.

## 2020-06-27 NOTE — Discharge Instructions (Signed)
I have reduced your hip dislocation.  I want you to keep the splint on  and use a walker when walking.  I recommend over-the-counter pain medications like ibuprofen and/or Tylenol every 6 hours as needed please follow dosing the back of bottle.  You may apply ice to the area as this can help decrease swelling and inflammation.  It is important to follow-up with your orthopedic surgeon for further evaluation, have given you the contact information above please call to schedule a follow-up appointment.  Come back to the emergency department if you develop chest pain, shortness of breath, severe abdominal pain, uncontrolled nausea, vomiting, diarrhea.

## 2020-06-27 NOTE — Sedation Documentation (Signed)
Pt alert and orientedx4. Answering questions appropriately 

## 2020-06-27 NOTE — ED Provider Notes (Signed)
.  Sedation  Date/Time: 06/27/2020 8:20 PM Performed by: Breck Coons, MD Authorized by: Breck Coons, MD   Consent:    Consent obtained:  Verbal and written   Consent given by:  Patient   Risks discussed:  Allergic reaction, dysrhythmia, inadequate sedation, nausea, vomiting, prolonged sedation necessitating reversal, prolonged hypoxia resulting in organ damage and respiratory compromise necessitating ventilatory assistance and intubation Universal protocol:    Immediately prior to procedure, a time out was called: yes     Patient identity confirmed:  Arm band Indications:    Procedure performed:  Dislocation reduction   Procedure necessitating sedation performed by:  Physician performing sedation Pre-sedation assessment:    Time since last food or drink:  4 hours   ASA classification: class 1 - normal, healthy patient     Mouth opening:  3 or more finger widths   Thyromental distance:  4 finger widths   Mallampati score:  I - soft palate, uvula, fauces, pillars visible   Neck mobility: normal     Pre-sedation assessments completed and reviewed: airway patency, mental status and respiratory function   Immediate pre-procedure details:    Reviewed: vital signs     Verified: bag valve mask available, emergency equipment available, intubation equipment available, IV patency confirmed, oxygen available and suction available   Procedure details (see MAR for exact dosages):    Total Provider sedation time (minutes):  10 Post-procedure details:    Post-sedation assessments completed and reviewed: airway patency, mental status and respiratory function     Procedure completion:  Tolerated well, no immediate complications      Breck Coons, MD 06/27/20 2022

## 2020-06-27 NOTE — ED Triage Notes (Signed)
Ems bringing pt from home. Pt states she bent over to get something off her shoe and her left hip popped. Pt complains of left hip pain

## 2020-07-11 DIAGNOSIS — Z96642 Presence of left artificial hip joint: Secondary | ICD-10-CM | POA: Diagnosis not present

## 2020-07-21 DIAGNOSIS — Z961 Presence of intraocular lens: Secondary | ICD-10-CM | POA: Diagnosis not present

## 2020-07-21 DIAGNOSIS — H524 Presbyopia: Secondary | ICD-10-CM | POA: Diagnosis not present

## 2020-07-21 DIAGNOSIS — H52223 Regular astigmatism, bilateral: Secondary | ICD-10-CM | POA: Diagnosis not present

## 2020-07-21 DIAGNOSIS — H43813 Vitreous degeneration, bilateral: Secondary | ICD-10-CM | POA: Diagnosis not present

## 2020-07-23 DIAGNOSIS — Z96642 Presence of left artificial hip joint: Secondary | ICD-10-CM | POA: Diagnosis not present

## 2020-07-28 ENCOUNTER — Other Ambulatory Visit (HOSPITAL_COMMUNITY): Payer: Self-pay

## 2020-07-30 NOTE — H&P (Signed)
H&P  Patient is admitted for left total hip arthroplasty bearing surface revision  Subjective:  Chief Complaint: Recurrent dislocation, left hip  HPI: Nancy Baxter, 80 y.o. female, presents for pre-operative visit in preparation for their left hip bearing surface revision, which is scheduled on 08/06/2020 with Dr. Wynelle Link at [facility]. The patient has recurrent dislocations of the left hip which has impacted their quality of life and ability to do activities of daily living. The patient currently has a diagnosis of left hip primary osteoarthritis and has failed conservative treatments including activity modification and use of a knee immobilizer. The patient has had previous total hip arthroplasty on the left hip. The patient denies an active infection.  Patient Active Problem List   Diagnosis Date Noted  . Primary hypertension 04/30/2020  . Restless legs syndrome (RLS) 12/25/2012  . Lumbosacral root lesions, not elsewhere classified 12/25/2012  . Postop Transfusion 01/07/2012  . Postop Acute blood loss anemia 01/06/2012  . Postop Hyponatremia 01/06/2012  . Failed total hip arthroplasty ([place]) 01/05/2012    Past Medical History:  Diagnosis Date  . Anemia   . Arthritis   . Complication of anesthesia   . GERD (gastroesophageal reflux disease)   . Heart murmur    evaluated 49yr ago-no cardiac follow up needed  . Hypertension   . PONV (postoperative nausea and vomiting)     Past Surgical History:  Procedure Laterality Date  . BACK SURGERY  1961   spinal fusion age   . CARPAL TUNNEL RELEASE Left 05/18/2016   Procedure: LEFT CARPAL TUNNEL RELEASE;  Surgeon: Daryll Brod, MD;  Location: [facility];  Service: Orthopedics;  Laterality: Left;  . EAR CYST EXCISION  02/23/2011  . FOOT ARTHRODESIS  5/12   hammer toes,bunionectomy  . JOINT REPLACEMENT  2006   rt hip  . JOINT REPLACEMENT  2008   lt hip  . TONSILLECTOMY     age 43  . TOTAL HIP REVISION   01/05/2012   Procedure: TOTAL HIP REVISION;  Surgeon: Gearlean Alf, MD;  Location: [facility];  Service: Orthopedics;  Laterality: Left;  . TRIGGER FINGER RELEASE  02/23/2011   Procedure: RELEASE TRIGGER FINGER/A-1 PULLEY;  Surgeon: Wynonia Sours, MD;  Location: [place];  Service: Orthopedics;  Laterality: Right;  . TRIGGER FINGER RELEASE Left 07/26/2012   Procedure: RELEASE TRIGGER FINGER/A-1 PULLEY LEFT SMALL FINGER;  Surgeon: Wynonia Sours, MD;  Location: [place];  Service: Orthopedics;  Laterality: Left;  . TRIGGER FINGER RELEASE Left 05/18/2016   Procedure: RELEASE TRIGGER FINGER/A-1 PULLEY LEFT INDEX;  Surgeon: Daryll Brod, MD;  Location: [place];  Service: Orthopedics;  Laterality: Left;  Marland Kitchen VAGINAL HYSTERECTOMY      Prior to Admission medications   Medication Sig Start Date End Date Taking? Authorizing Provider  acetaminophen (TYLENOL) 650 MG CR tablet Take 1,300 mg by mouth daily.   Yes [provider]  Calcium Carb-Cholecalciferol (CALTRATE 600+D3 PO) Take 1 tablet by mouth once a week.   Yes [provider]  Cyanocobalamin (VITAMIN B-12) 2500 MCG SUBL Take 5,000 mcg by mouth in the morning.   Yes [provider]  ferrous sulfate 325 (65 FE) MG tablet Take 325 mg by mouth once a week.   Yes [provider]  furosemide (LASIX) 20 MG tablet Take 40 mg by mouth in the morning. 02/25/15  Yes [provider]  losartan (COZAAR) 100 MG tablet Take 100 mg by  mouth in the morning. 05/29/20  Yes [provider]  metoprolol (TOPROL-XL) 50 MG 24 hr tablet Take 50 mg by mouth every morning.   Yes [provider]  Polyethyl Glycol-Propyl Glycol (SYSTANE ULTRA) 0.4-0.3 % SOLN Place 1-2 drops into both eyes 3 (three) times daily as needed (dry/irritated eyes.).   Yes [provider]  rOPINIRole (REQUIP XL) 2 MG 24 hr tablet Firs dose at 6 PM and second dose at bedtime. Patient taking  differently: Take 4 mg by mouth at bedtime. (2300) 04/30/20  Yes Dohmeier, Asencion Partridge, MD  Zinc 50 MG TABS Take 50 mg by mouth in the morning.   Yes [provider]    Allergies  Allergen Reactions  . Sulfa Antibiotics Hives  . Codeine Nausea And Vomiting  . Prednisone Other (See Comments)    Shuts down adrenals  . Zolpidem Tartrate Other (See Comments)    sleepwalks  . Adhesive [Tape] Rash    blisters    Social History   Socioeconomic History  . Marital status: Widowed    Spouse name: [name]   . Number of children: 5  . Years of education: College  . Highest education level: Not on file  Occupational History    Comment: retired  Tobacco Use  . Smoking status: Never Smoker  . Smokeless tobacco: Never Used  Substance and Sexual Activity  . Alcohol use: Yes    Comment: occasional, 03/17/15 infrequently  . Drug use: No  . Sexual activity: Not on file  Other Topics Concern  . Not on file  Social History Narrative   Patient is retired and married. [name])   Patient has some college education.    Patient has 5 children.    Right handed   Caffeine two cups of caffeine daily ( coffee)   Social Determinants of Health   Financial Resource Strain: Not on file  Food Insecurity: Not on file  Transportation Needs: Not on file  Physical Activity: Not on file  Stress: Not on file  Social Connections: Not on file  Intimate Partner Violence: Not on file    Tobacco Use: Low Risk   . Smoking Tobacco Use: Never Smoker  . Smokeless Tobacco Use: Never Used   Social History   Substance and Sexual Activity  Alcohol Use Yes   Comment: occasional, 03/17/15 infrequently    Family History  Problem Relation Age of Onset  . Colon cancer Neg Hx   . Esophageal cancer Neg Hx   . Rectal cancer Neg Hx   . Stomach cancer Neg Hx     Review of Systems  Constitutional: Negative for chills and fever.  HENT: Negative for congestion, sore throat and tinnitus.   Eyes: Negative for  double vision, photophobia and pain.  Respiratory: Negative for cough, shortness of breath and wheezing.   Cardiovascular: Negative for chest pain, palpitations and orthopnea.  Gastrointestinal: Negative for heartburn, nausea and vomiting.  Genitourinary: Negative for dysuria, frequency and urgency.  Musculoskeletal: Positive for joint pain.  Neurological: Negative for dizziness, weakness and headaches.     Objective:  Physical Exam: Well nourished and well developed.  General: Alert and oriented x3, cooperative and pleasant, no acute distress.  Head: normocephalic, atraumatic, neck supple.  Eyes: EOMI.  Respiratory: breath sounds clear in all fields, no wheezing, rales, or rhonchi. Cardiovascular: Regular rate and rhythm, no murmurs, gallops or rubs.  Abdomen: non-tender to palpation and soft, normoactive bowel sounds. Musculoskeletal:  Slight antalgic gait pattern with a knee immobilizer.  Left Hip Exam:  Range of motion: I did not range her hip today because she had the subluxation today and is very sore.  Calves soft and nontender. Motor function intact in LE. Strength 5/5 LE bilaterally. Neuro: Distal pulses 2+. Sensation to light touch intact in LE.   Imaging Review Plain radiographs demonstrate the prosthesis in good position with no periprosthetic abnormalities.   Assessment/Plan:  Recurrent dislocation, left total hip  Risks and benefits of the procedure were discussed with the patient. The patient acknowledged the explanation, agreed to proceed with the plan and consent was signed. Patient is being admitted for inpatient treatment for surgery, pain control, PT, OT, prophylactic antibiotics, VTE prophylaxis, progressive ambulation and ADLs and discharge planning.The patient is planning to be discharged home.  Therapy Plans: HEP Disposition: Home with daughters Planned DVT Prophylaxis: Aspirin 325 mg BID DME Needed: None PCP: Shon Baton, MD (clearance  received) TXA: IV Allergies: Prednisone, codeine (N/V) Anesthesia Concerns: None BMI: 24 Last HgbA1c: Not diabetic  Pharmacy: [place]  Other: Discussed tramadol for postoperative pain  - Patient was instructed on what medications to stop prior to surgery. - Follow-up visit in 2 weeks with Dr. Wynelle Link - Begin physical therapy following surgery - Pre-operative lab work as pre-surgical testing - Prescriptions will be provided in hospital at time of discharge  Theresa Duty, PA-C Orthopedic Surgery [facility] [place]

## 2020-07-31 DIAGNOSIS — Z1231 Encounter for screening mammogram for malignant neoplasm of breast: Secondary | ICD-10-CM | POA: Diagnosis not present

## 2020-07-31 DIAGNOSIS — N958 Other specified menopausal and perimenopausal disorders: Secondary | ICD-10-CM | POA: Diagnosis not present

## 2020-08-01 ENCOUNTER — Encounter (HOSPITAL_COMMUNITY): Payer: Self-pay

## 2020-08-01 ENCOUNTER — Other Ambulatory Visit: Payer: Self-pay | Admitting: Neurology

## 2020-08-01 DIAGNOSIS — G2581 Restless legs syndrome: Secondary | ICD-10-CM

## 2020-08-01 NOTE — Patient Instructions (Addendum)
DUE TO COVID-19 ONLY ONE VISITOR IS ALLOWED TO COME WITH YOU AND STAY IN THE WAITING ROOM ONLY DURING PRE OP AND PROCEDURE DAY OF SURGERY.  TWO  VISITOR  MAY VISIT WITH YOU AFTER SURGERY IN YOUR PRIVATE ROOM DURING VISITING HOURS ONLY!  YOU NEED TO HAVE A COVID 19 TEST ON___4-25____ @___11 :50____, THIS TEST MUST BE DONE BEFORE SURGERY,  COVID TESTING SITE [place]  [id number],   IT IS ON THE RIGHT GOING OUT [address] APPROXIMATELY  2 MINUTES PAST [employer] ON THE RIGHT. ONCE YOUR COVID TEST IS COMPLETED,  PLEASE BEGIN THE QUARANTINE INSTRUCTIONS AS OUTLINED IN YOUR HANDOUT.                Nancy Baxter  08/01/2020   Your procedure is scheduled on: 08-06-20    Report to [facility] Main  Entrance   Report to admitting at       3:30   PM     Call this number if you have problems the morning of surgery [phone]    Remember: NO SOLID FOOD AFTER MIDNIGHT THE NIGHT PRIOR TO SURGERY. NOTHING BY MOUTH EXCEPT CLEAR LIQUIDS UNTIL 230 pm . PLEASE FINISH ENSURE DRINK PER SURGEON ORDER  WHICH NEEDS TO BE COMPLETED AT        230 pm   Then nothing by mouth .   CLEAR LIQUID DIET water                                                                                                                                Foods Excluded Black Coffee and tea, regular and decaf                                                        liquids that you cannot  Plain Jell-O any favor except red or purple                                                    see through such as: Fruit ices (not with fruit pulp)                                                                               milk, soups, orange juice  Iced Popsicles  All solid food Carbonated beverages, regular and diet                                    Cranberry, grape and apple juices Sports drinks like Gatorade Lightly  seasoned clear broth or consume(fat free) Sugar, honey syrup  _____________________________________________________________________     BRUSH YOUR TEETH MORNING OF SURGERY AND RINSE YOUR MOUTH OUT, NO CHEWING GUM CANDY OR MINTS.     Take these medicines the morning of surgery with A SIP OF WATER: metoprolol, tylenol if needed                                 You may not have any metal on your body including hair pins and              piercings  Do not wear jewelry, make-up, lotions, powders or perfumes, deodorant             Do not wear nail polish on your fingernails.  Do not shave  48 hours prior to surgery.  .   Do not bring valuables to the hospital. [place].  Contacts, dentures or bridgework may not be worn into surgery.      Patients discharged the day of surgery will not be allowed to drive home. IF YOU ARE HAVING SURGERY AND GOING HOME THE SAME DAY, YOU MUST HAVE AN ADULT TO DRIVE YOU HOME AND BE WITH YOU FOR 24 HOURS. YOU MAY GO HOME BY TAXI OR UBER OR ORTHERWISE, BUT AN ADULT MUST ACCOMPANY YOU HOME AND STAY WITH YOU FOR 24 HOURS.  Name and phone number of your driver:  Special Instructions: N/A              Please read over the following fact sheets you were given: _____________________________________________________________________             [facility] - Preparing for Surgery Before surgery, you can play an important role.  Because skin is not sterile, your skin needs to be as free of germs as possible.  You can reduce the number of germs on your skin by washing with CHG (chlorahexidine gluconate) soap before surgery.  CHG is an antiseptic cleaner which kills germs and bonds with the skin to continue killing germs even after washing. Please DO NOT use if you have an allergy to CHG or antibacterial soaps.  If your skin becomes reddened/irritated stop using the CHG and inform your nurse when you arrive at Short  Stay. Do not shave (including legs and underarms) for at least 48 hours prior to the first CHG shower.  You may shave your face/neck. Please follow these instructions carefully:  1.  Shower with CHG Soap the night before surgery and the  morning of Surgery.  2.  If you choose to wash your hair, wash your hair first as usual with your  normal  shampoo.  3.  After you shampoo, rinse your hair and body thoroughly to remove the  shampoo.                           4.  Use CHG as you would any other liquid soap.  You can apply chg  directly  to the skin and wash                       Gently with a scrungie or clean washcloth.  5.  Apply the CHG Soap to your body ONLY FROM THE NECK DOWN.   Do not use on face/ open                           Wound or open sores. Avoid contact with eyes, ears mouth and genitals (private parts).                       Wash face,  Genitals (private parts) with your normal soap.             6.  Wash thoroughly, paying special attention to the area where your surgery  will be performed.  7.  Thoroughly rinse your body with warm water from the neck down.  8.  DO NOT shower/wash with your normal soap after using and rinsing off  the CHG Soap.                9.  Pat yourself dry with a clean towel.            10.  Wear clean pajamas.            11.  Place clean sheets on your bed the night of your first shower and do not  sleep with pets. Day of Surgery : Do not apply any lotions/deodorants the morning of surgery.  Please wear clean clothes to the hospital/surgery center.  FAILURE TO FOLLOW THESE INSTRUCTIONS MAY RESULT IN THE CANCELLATION OF YOUR SURGERY PATIENT SIGNATURE_________________________________  NURSE SIGNATURE__________________________________  ________________________________________________________________________   Nancy Baxter  An incentive spirometer is a tool that can help keep your lungs clear and active. This tool measures how well you are  filling your lungs with each breath. Taking long deep breaths may help reverse or decrease the chance of developing breathing (pulmonary) problems (especially infection) following:  A long period of time when you are unable to move or be active. BEFORE THE PROCEDURE   If the spirometer includes an indicator to show your best effort, your nurse or respiratory therapist will set it to a desired goal.  If possible, sit up straight or lean slightly forward. Try not to slouch.  Hold the incentive spirometer in an upright position. INSTRUCTIONS FOR USE  1. Sit on the edge of your bed if possible, or sit up as far as you can in bed or on a chair. 2. Hold the incentive spirometer in an upright position. 3. Breathe out normally. 4. Place the mouthpiece in your mouth and seal your lips tightly around it. 5. Breathe in slowly and as deeply as possible, raising the piston or the ball toward the top of the column. 6. Hold your breath for 3-5 seconds or for as long as possible. Allow the piston or ball to fall to the bottom of the column. 7. Remove the mouthpiece from your mouth and breathe out normally. 8. Rest for a few seconds and repeat Steps 1 through 7 at least 10 times every 1-2 hours when you are awake. Take your time and take a few normal breaths between deep breaths. 9. The spirometer may include an indicator to show your best effort. Use the indicator as a goal to work toward during each repetition. 10.  After each set of 10 deep breaths, practice coughing to be sure your lungs are clear. If you have an incision (the cut made at the time of surgery), support your incision when coughing by placing a pillow or rolled up towels firmly against it. Once you are able to get out of bed, walk around indoors and cough well. You may stop using the incentive spirometer when instructed by your caregiver.  RISKS AND COMPLICATIONS  Take your time so you do not get dizzy or light-headed.  If you are in pain,  you may need to take or ask for pain medication before doing incentive spirometry. It is harder to take a deep breath if you are having pain. AFTER USE  Rest and breathe slowly and easily.  It can be helpful to keep track of a log of your progress. Your caregiver can provide you with a simple table to help with this. If you are using the spirometer at home, follow these instructions: [place] IF:   You are having difficultly using the spirometer.  You have trouble using the spirometer as often as instructed.  Your pain medication is not giving enough relief while using the spirometer.  You develop fever of 100.5 F (38.1 C) or higher. SEEK IMMEDIATE MEDICAL CARE IF:   You cough up bloody sputum that had not been present before.  You develop fever of 102 F (38.9 C) or greater.  You develop worsening pain at or near the incision site. MAKE SURE YOU:   Understand these instructions.  Will watch your condition.  Will get help right away if you are not doing well or get worse. Document Released: 08/09/2006 Document Revised: 06/21/2011 Document Reviewed: 10/10/2006 [facility] Patient Information 2014 [place], [place].   ________________________________________________________________________

## 2020-08-01 NOTE — Progress Notes (Addendum)
PCP - Shon Baton ,MD Cardiologist - no  PPM/ICD -  Device Orders -  Rep Notified -   Chest x-ray -  EKG - 06-27-20 Stress Test -  ECHO -  Cardiac Cath -   Sleep Study -  CPAP -   Fasting Blood Sugar -  Checks Blood Sugar _____ times a day  Blood Thinner Instructions: Aspirin Instructions:  ERAS Protcol - PRE-SURGERY Ensure or G2-   Fully vaccinated -[place] TEST- 08-04-20  Activity--Does ADL without SOB Anesthesia review: HTN  Patient denies shortness of breath, fever, cough and chest pain at PAT appointment   All instructions explained to the patient, with a verbal understanding of the material. Patient agrees to go over the instructions while at home for a better understanding. Patient also instructed to self quarantine after being tested for COVID-19. The opportunity to ask questions was provided.

## 2020-08-04 ENCOUNTER — Other Ambulatory Visit (HOSPITAL_COMMUNITY)
Admission: RE | Admit: 2020-08-04 | Discharge: 2020-08-04 | Disposition: A | Discharge status: Home / Self Care | Payer: PPO | Source: Ambulatory Visit | Attending: Orthopedic Surgery | Admitting: Orthopedic Surgery

## 2020-08-04 ENCOUNTER — Encounter (HOSPITAL_COMMUNITY)
Admission: RE | Admit: 2020-08-04 | Discharge: 2020-08-04 | Disposition: A | Discharge status: Home / Self Care | Payer: PPO | Source: Ambulatory Visit | Attending: Orthopedic Surgery | Admitting: Orthopedic Surgery

## 2020-08-04 ENCOUNTER — Encounter (HOSPITAL_COMMUNITY): Payer: Self-pay

## 2020-08-04 ENCOUNTER — Other Ambulatory Visit: Payer: Self-pay

## 2020-08-04 DIAGNOSIS — Z96642 Presence of left artificial hip joint: Secondary | ICD-10-CM | POA: Diagnosis not present

## 2020-08-04 DIAGNOSIS — Z96643 Presence of artificial hip joint, bilateral: Secondary | ICD-10-CM | POA: Diagnosis present

## 2020-08-04 DIAGNOSIS — Z91048 Other nonmedicinal substance allergy status: Secondary | ICD-10-CM | POA: Diagnosis not present

## 2020-08-04 DIAGNOSIS — Z882 Allergy status to sulfonamides status: Secondary | ICD-10-CM | POA: Diagnosis not present

## 2020-08-04 DIAGNOSIS — Z79899 Other long term (current) drug therapy: Secondary | ICD-10-CM | POA: Diagnosis not present

## 2020-08-04 DIAGNOSIS — D62 Acute posthemorrhagic anemia: Secondary | ICD-10-CM | POA: Diagnosis not present

## 2020-08-04 DIAGNOSIS — Z885 Allergy status to narcotic agent status: Secondary | ICD-10-CM | POA: Diagnosis not present

## 2020-08-04 DIAGNOSIS — Z471 Aftercare following joint replacement surgery: Secondary | ICD-10-CM | POA: Diagnosis not present

## 2020-08-04 DIAGNOSIS — Z01812 Encounter for preprocedural laboratory examination: Secondary | ICD-10-CM | POA: Insufficient documentation

## 2020-08-04 DIAGNOSIS — T84021A Dislocation of internal left hip prosthesis, initial encounter: Secondary | ICD-10-CM | POA: Diagnosis not present

## 2020-08-04 DIAGNOSIS — M24452 Recurrent dislocation, left hip: Secondary | ICD-10-CM | POA: Diagnosis present

## 2020-08-04 DIAGNOSIS — R011 Cardiac murmur, unspecified: Secondary | ICD-10-CM | POA: Diagnosis present

## 2020-08-04 DIAGNOSIS — M1612 Unilateral primary osteoarthritis, left hip: Secondary | ICD-10-CM | POA: Diagnosis present

## 2020-08-04 DIAGNOSIS — G2581 Restless legs syndrome: Secondary | ICD-10-CM | POA: Diagnosis present

## 2020-08-04 DIAGNOSIS — D649 Anemia, unspecified: Secondary | ICD-10-CM | POA: Diagnosis present

## 2020-08-04 DIAGNOSIS — Z20822 Contact with and (suspected) exposure to covid-19: Secondary | ICD-10-CM | POA: Diagnosis present

## 2020-08-04 DIAGNOSIS — Z888 Allergy status to other drugs, medicaments and biological substances status: Secondary | ICD-10-CM | POA: Diagnosis not present

## 2020-08-04 DIAGNOSIS — T84061A Wear of articular bearing surface of internal prosthetic left hip joint, initial encounter: Secondary | ICD-10-CM | POA: Diagnosis not present

## 2020-08-04 DIAGNOSIS — K219 Gastro-esophageal reflux disease without esophagitis: Secondary | ICD-10-CM | POA: Diagnosis present

## 2020-08-04 DIAGNOSIS — I10 Essential (primary) hypertension: Secondary | ICD-10-CM | POA: Diagnosis present

## 2020-08-04 HISTORY — DX: Restless legs syndrome: G25.81

## 2020-08-04 LAB — CBC
HCT: 39.4 % (ref 36.0–46.0)
Hemoglobin: 13.2 g/dL (ref 12.0–15.0)
MCH: 32.5 pg (ref 26.0–34.0)
MCHC: 33.5 g/dL (ref 30.0–36.0)
MCV: 97 fL (ref 80.0–100.0)
Platelets: 140 10*3/uL — ABNORMAL LOW (ref 150–400)
RBC: 4.06 MIL/uL (ref 3.87–5.11)
RDW: 12.1 % (ref 11.5–15.5)
WBC: 5.1 10*3/uL (ref 4.0–10.5)
nRBC: 0 % (ref 0.0–0.2)

## 2020-08-04 LAB — COMPREHENSIVE METABOLIC PANEL
ALT: 20 U/L (ref 0–44)
AST: 26 U/L (ref 15–41)
Albumin: 4 g/dL (ref 3.5–5.0)
Alkaline Phosphatase: 90 U/L (ref 38–126)
Anion gap: 12 (ref 5–15)
BUN: 45 mg/dL — ABNORMAL HIGH (ref 8–23)
CO2: 24 mmol/L (ref 22–32)
Calcium: 9.5 mg/dL (ref 8.9–10.3)
Chloride: 104 mmol/L (ref 98–111)
Creatinine, Ser: 1.39 mg/dL — ABNORMAL HIGH (ref 0.44–1.00)
GFR, Estimated: 38 mL/min — ABNORMAL LOW (ref >60)
Glucose, Bld: 102 mg/dL — ABNORMAL HIGH (ref 70–99)
Potassium: 4.2 mmol/L (ref 3.5–5.1)
Sodium: 140 mmol/L (ref 135–145)
Total Bilirubin: 0.1 mg/dL — ABNORMAL LOW (ref 0.3–1.2)
Total Protein: 7.1 g/dL (ref 6.5–8.1)

## 2020-08-04 LAB — PROTIME-INR
INR: 1 (ref 0.8–1.2)
Prothrombin Time: 12.7 seconds (ref 11.4–15.2)

## 2020-08-05 LAB — SARS CORONAVIRUS 2 (TAT 6-24 HRS): SARS Coronavirus 2: NEGATIVE

## 2020-08-06 ENCOUNTER — Inpatient Hospital Stay (HOSPITAL_COMMUNITY)
Admission: RE | Admit: 2020-08-06 | Discharge: 2020-08-07 | DRG: 468 | Disposition: A | Discharge status: Home / Self Care | Payer: PPO | Source: Ambulatory Visit | Attending: Orthopedic Surgery | Admitting: Orthopedic Surgery

## 2020-08-06 ENCOUNTER — Inpatient Hospital Stay (HOSPITAL_COMMUNITY): Payer: PPO | Admitting: Anesthesiology

## 2020-08-06 ENCOUNTER — Encounter (HOSPITAL_COMMUNITY)
Admission: RE | Disposition: A | Discharge status: Home / Self Care | Payer: Self-pay | Source: Ambulatory Visit | Attending: Orthopedic Surgery

## 2020-08-06 ENCOUNTER — Inpatient Hospital Stay (HOSPITAL_COMMUNITY): Payer: PPO

## 2020-08-06 ENCOUNTER — Encounter (HOSPITAL_COMMUNITY): Payer: Self-pay | Admitting: Orthopedic Surgery

## 2020-08-06 DIAGNOSIS — Z888 Allergy status to other drugs, medicaments and biological substances status: Secondary | ICD-10-CM | POA: Diagnosis not present

## 2020-08-06 DIAGNOSIS — M1612 Unilateral primary osteoarthritis, left hip: Secondary | ICD-10-CM | POA: Diagnosis present

## 2020-08-06 DIAGNOSIS — M24452 Recurrent dislocation, left hip: Secondary | ICD-10-CM | POA: Diagnosis present

## 2020-08-06 DIAGNOSIS — Z96649 Presence of unspecified artificial hip joint: Principal | ICD-10-CM

## 2020-08-06 DIAGNOSIS — Z20822 Contact with and (suspected) exposure to covid-19: Secondary | ICD-10-CM | POA: Diagnosis present

## 2020-08-06 DIAGNOSIS — Z885 Allergy status to narcotic agent status: Secondary | ICD-10-CM

## 2020-08-06 DIAGNOSIS — K219 Gastro-esophageal reflux disease without esophagitis: Secondary | ICD-10-CM | POA: Diagnosis present

## 2020-08-06 DIAGNOSIS — D649 Anemia, unspecified: Secondary | ICD-10-CM | POA: Diagnosis present

## 2020-08-06 DIAGNOSIS — I10 Essential (primary) hypertension: Secondary | ICD-10-CM | POA: Diagnosis present

## 2020-08-06 DIAGNOSIS — Z91048 Other nonmedicinal substance allergy status: Secondary | ICD-10-CM

## 2020-08-06 DIAGNOSIS — Z96643 Presence of artificial hip joint, bilateral: Secondary | ICD-10-CM | POA: Diagnosis present

## 2020-08-06 DIAGNOSIS — R011 Cardiac murmur, unspecified: Secondary | ICD-10-CM | POA: Diagnosis present

## 2020-08-06 DIAGNOSIS — Z882 Allergy status to sulfonamides status: Secondary | ICD-10-CM | POA: Diagnosis not present

## 2020-08-06 DIAGNOSIS — Z79899 Other long term (current) drug therapy: Secondary | ICD-10-CM | POA: Diagnosis not present

## 2020-08-06 DIAGNOSIS — G2581 Restless legs syndrome: Secondary | ICD-10-CM | POA: Diagnosis present

## 2020-08-06 HISTORY — PX: TOTAL HIP REVISION: SHX763

## 2020-08-06 LAB — TYPE AND SCREEN
ABO/RH(D): O POS
Antibody Screen: NEGATIVE

## 2020-08-06 SURGERY — TOTAL HIP REVISION
Anesthesia: General | Site: Hip | Laterality: Left

## 2020-08-06 MED ORDER — SODIUM CHLORIDE 0.9 % IR SOLN
Status: DC | PRN
  Administered 2020-08-06: 1000 mL

## 2020-08-06 MED ORDER — CEFAZOLIN SODIUM-DEXTROSE 2-4 GM/100ML-% IV SOLN
2.0000 g | INTRAVENOUS | Status: AC
  Administered 2020-08-06: 2 g via INTRAVENOUS
  Filled 2020-08-06: qty 100

## 2020-08-06 MED ORDER — TRAMADOL HCL 50 MG PO TABS: 50.0000 mg | ORAL_TABLET | Freq: Four times a day (QID) | ORAL | Status: DC | PRN

## 2020-08-06 MED ORDER — ROPINIROLE HCL ER 4 MG PO TB24
4.0000 mg | ORAL_TABLET | Freq: Every day | ORAL | Status: DC
  Filled 2020-08-06: qty 1

## 2020-08-06 MED ORDER — ASPIRIN EC 325 MG PO TBEC
325.0000 mg | DELAYED_RELEASE_TABLET | Freq: Two times a day (BID) | ORAL | Status: DC
  Administered 2020-08-07: 325 mg via ORAL
  Filled 2020-08-06: qty 1

## 2020-08-06 MED ORDER — MAGNESIUM CITRATE PO SOLN: 1.0000 | Freq: Once | ORAL | Status: DC | PRN

## 2020-08-06 MED ORDER — LOSARTAN POTASSIUM 50 MG PO TABS
100.0000 mg | ORAL_TABLET | Freq: Every morning | ORAL | Status: DC
  Administered 2020-08-06 – 2020-08-07 (×2): 100 mg via ORAL
  Filled 2020-08-06 (×2): qty 2

## 2020-08-06 MED ORDER — METOCLOPRAMIDE HCL 5 MG/ML IJ SOLN
5.0000 mg | Freq: Three times a day (TID) | INTRAMUSCULAR | Status: DC | PRN
Start: 2020-08-06 — End: 2020-08-07

## 2020-08-06 MED ORDER — HYDROCODONE-ACETAMINOPHEN 5-325 MG PO TABS: 1.0000 | ORAL_TABLET | ORAL | Status: DC | PRN

## 2020-08-06 MED ORDER — METHOCARBAMOL 500 MG IVPB - SIMPLE MED
500.0000 mg | Freq: Four times a day (QID) | INTRAVENOUS | Status: DC | PRN
  Filled 2020-08-06: qty 50

## 2020-08-06 MED ORDER — ACETAMINOPHEN 10 MG/ML IV SOLN
1000.0000 mg | Freq: Once | INTRAVENOUS | Status: AC
  Administered 2020-08-06: 1000 mg via INTRAVENOUS
  Filled 2020-08-06: qty 100

## 2020-08-06 MED ORDER — PHENOL 1.4 % MT LIQD: 1.0000 | OROMUCOSAL | Status: DC | PRN

## 2020-08-06 MED ORDER — LIDOCAINE 2% (20 MG/ML) 5 ML SYRINGE
INTRAMUSCULAR | Status: DC | PRN
  Administered 2020-08-06: 60 mg via INTRAVENOUS

## 2020-08-06 MED ORDER — BISACODYL 10 MG RE SUPP: 10.0000 mg | Freq: Every day | RECTAL | Status: DC | PRN

## 2020-08-06 MED ORDER — PROPOFOL 10 MG/ML IV BOLUS
INTRAVENOUS | Status: DC | PRN
  Administered 2020-08-06: 90 mg via INTRAVENOUS

## 2020-08-06 MED ORDER — TRANEXAMIC ACID-NACL 1000-0.7 MG/100ML-% IV SOLN
1000.0000 mg | INTRAVENOUS | Status: AC
  Administered 2020-08-06: 1000 mg via INTRAVENOUS
  Filled 2020-08-06: qty 100

## 2020-08-06 MED ORDER — FENTANYL CITRATE (PF) 100 MCG/2ML IJ SOLN
INTRAMUSCULAR | Status: AC
  Filled 2020-08-06: qty 2

## 2020-08-06 MED ORDER — METHOCARBAMOL 500 MG PO TABS: 500.0000 mg | ORAL_TABLET | Freq: Four times a day (QID) | ORAL | Status: DC | PRN

## 2020-08-06 MED ORDER — ACETAMINOPHEN 325 MG PO TABS: 325.0000 mg | ORAL_TABLET | Freq: Four times a day (QID) | ORAL | Status: DC | PRN

## 2020-08-06 MED ORDER — CHLORHEXIDINE GLUCONATE 0.12 % MT SOLN
15.0000 mL | Freq: Once | OROMUCOSAL | Status: AC
  Administered 2020-08-06: 15 mL via OROMUCOSAL

## 2020-08-06 MED ORDER — POLYETHYLENE GLYCOL 3350 17 G PO PACK: 17.0000 g | PACK | Freq: Every day | ORAL | Status: DC | PRN

## 2020-08-06 MED ORDER — MORPHINE SULFATE (PF) 2 MG/ML IV SOLN: 0.5000 mg | INTRAVENOUS | Status: DC | PRN

## 2020-08-06 MED ORDER — FUROSEMIDE 40 MG PO TABS
40.0000 mg | ORAL_TABLET | Freq: Every morning | ORAL | Status: DC
  Administered 2020-08-07: 40 mg via ORAL
  Filled 2020-08-06: qty 1

## 2020-08-06 MED ORDER — EPHEDRINE SULFATE-NACL 50-0.9 MG/10ML-% IV SOSY
PREFILLED_SYRINGE | INTRAVENOUS | Status: DC | PRN
  Administered 2020-08-06: 10 mg via INTRAVENOUS
  Administered 2020-08-06: 5 mg via INTRAVENOUS
  Administered 2020-08-06: 10 mg via INTRAVENOUS

## 2020-08-06 MED ORDER — SUGAMMADEX SODIUM 200 MG/2ML IV SOLN
INTRAVENOUS | Status: DC | PRN
  Administered 2020-08-06: 150 mg via INTRAVENOUS

## 2020-08-06 MED ORDER — ONDANSETRON HCL 4 MG PO TABS: 4.0000 mg | ORAL_TABLET | Freq: Four times a day (QID) | ORAL | Status: DC | PRN

## 2020-08-06 MED ORDER — ROCURONIUM BROMIDE 10 MG/ML (PF) SYRINGE
PREFILLED_SYRINGE | INTRAVENOUS | Status: DC | PRN
  Administered 2020-08-06: 50 mg via INTRAVENOUS

## 2020-08-06 MED ORDER — FENTANYL CITRATE (PF) 100 MCG/2ML IJ SOLN
INTRAMUSCULAR | Status: DC | PRN
  Administered 2020-08-06 (×6): 50 ug via INTRAVENOUS

## 2020-08-06 MED ORDER — MENTHOL 3 MG MT LOZG: 1.0000 | LOZENGE | OROMUCOSAL | Status: DC | PRN

## 2020-08-06 MED ORDER — FENTANYL CITRATE (PF) 100 MCG/2ML IJ SOLN: 25.0000 ug | INTRAMUSCULAR | Status: DC | PRN

## 2020-08-06 MED ORDER — DEXAMETHASONE SODIUM PHOSPHATE 10 MG/ML IJ SOLN: 10.0000 mg | Freq: Once | INTRAMUSCULAR | Status: DC

## 2020-08-06 MED ORDER — SODIUM CHLORIDE 0.9 % IV SOLN: INTRAVENOUS | Status: DC

## 2020-08-06 MED ORDER — DROPERIDOL 2.5 MG/ML IJ SOLN: 0.6250 mg | Freq: Once | INTRAMUSCULAR | Status: DC | PRN

## 2020-08-06 MED ORDER — METOPROLOL SUCCINATE ER 50 MG PO TB24
50.0000 mg | ORAL_TABLET | Freq: Every morning | ORAL | Status: DC
  Administered 2020-08-07: 50 mg via ORAL
  Filled 2020-08-06: qty 1

## 2020-08-06 MED ORDER — DOCUSATE SODIUM 100 MG PO CAPS
100.0000 mg | ORAL_CAPSULE | Freq: Two times a day (BID) | ORAL | Status: DC
  Administered 2020-08-06: 100 mg via ORAL
  Filled 2020-08-06: qty 1

## 2020-08-06 MED ORDER — ORAL CARE MOUTH RINSE: 15.0000 mL | Freq: Once | OROMUCOSAL | Status: AC

## 2020-08-06 MED ORDER — CEFAZOLIN SODIUM-DEXTROSE 2-4 GM/100ML-% IV SOLN
2.0000 g | Freq: Four times a day (QID) | INTRAVENOUS | Status: AC
  Administered 2020-08-06 – 2020-08-07 (×2): 2 g via INTRAVENOUS
  Filled 2020-08-06 (×2): qty 100

## 2020-08-06 MED ORDER — ONDANSETRON HCL 4 MG/2ML IJ SOLN: 4.0000 mg | Freq: Four times a day (QID) | INTRAMUSCULAR | Status: DC | PRN

## 2020-08-06 MED ORDER — POVIDONE-IODINE 10 % EX SWAB
2.0000 "application " | Freq: Once | CUTANEOUS | Status: AC
  Administered 2020-08-06: 2 via TOPICAL

## 2020-08-06 MED ORDER — TRAMADOL HCL 50 MG PO TABS: 50.0000 mg | ORAL_TABLET | Freq: Two times a day (BID) | ORAL | Status: DC | PRN

## 2020-08-06 MED ORDER — DEXAMETHASONE SODIUM PHOSPHATE 10 MG/ML IJ SOLN
INTRAMUSCULAR | Status: DC | PRN
  Administered 2020-08-06: 10 mg via INTRAVENOUS

## 2020-08-06 MED ORDER — LACTATED RINGERS IV SOLN: INTRAVENOUS | Status: DC

## 2020-08-06 MED ORDER — FENTANYL CITRATE (PF) 100 MCG/2ML IJ SOLN
INTRAMUSCULAR | Status: AC
  Administered 2020-08-06: 25 ug via INTRAVENOUS
  Filled 2020-08-06: qty 2

## 2020-08-06 MED ORDER — ONDANSETRON HCL 4 MG/2ML IJ SOLN
INTRAMUSCULAR | Status: DC | PRN
  Administered 2020-08-06: 4 mg via INTRAVENOUS

## 2020-08-06 MED ORDER — METOCLOPRAMIDE HCL 5 MG PO TABS: 5.0000 mg | ORAL_TABLET | Freq: Three times a day (TID) | ORAL | Status: DC | PRN

## 2020-08-06 SURGICAL SUPPLY — 67 items
BAG DECANTER FOR FLEXI CONT (MISCELLANEOUS) ×1 IMPLANT
BAG SPEC THK2 15X12 ZIP CLS (MISCELLANEOUS) ×1
BAG ZIPLOCK 12X15 (MISCELLANEOUS) ×3 IMPLANT
BIT DRILL 2.8X128 (BIT) ×2 IMPLANT
BLADE EXTENDED COATED 6.5IN (ELECTRODE) ×1 IMPLANT
BLADE SAW SAG 73X25 THK (BLADE) ×1
BLADE SAW SGTL 73X25 THK (BLADE) IMPLANT
COVER SURGICAL LIGHT HANDLE (MISCELLANEOUS) ×2 IMPLANT
COVER WAND RF STERILE (DRAPES) IMPLANT
DRAPE INCISE IOBAN 66X45 STRL (DRAPES) ×2 IMPLANT
DRAPE ORTHO SPLIT 77X108 STRL (DRAPES) ×4
DRAPE POUCH INSTRU U-SHP 10X18 (DRAPES) ×2 IMPLANT
DRAPE SURG ORHT 6 SPLT 77X108 (DRAPES) ×2 IMPLANT
DRAPE U-SHAPE 47X51 STRL (DRAPES) ×2 IMPLANT
DRESSING MEPILEX FLEX 4X4 (GAUZE/BANDAGES/DRESSINGS) ×2 IMPLANT
DRSG AQUACEL AG ADV 3.5X10 (GAUZE/BANDAGES/DRESSINGS) ×1 IMPLANT
DRSG EMULSION OIL 3X16 NADH (GAUZE/BANDAGES/DRESSINGS) ×1 IMPLANT
DRSG MEPILEX BORDER 4X8 (GAUZE/BANDAGES/DRESSINGS) ×1 IMPLANT
DRSG MEPILEX FLEX 4X4 (GAUZE/BANDAGES/DRESSINGS)
DURAPREP 26ML APPLICATOR (WOUND CARE) ×2 IMPLANT
ELECT REM PT RETURN 15FT ADLT (MISCELLANEOUS) ×2 IMPLANT
EVACUATOR 1/8 PVC DRAIN (DRAIN) ×1 IMPLANT
FACESHIELD WRAPAROUND (MASK) ×8 IMPLANT
FACESHIELD WRAPAROUND OR TEAM (MASK) ×4 IMPLANT
GAUZE SPONGE 4X4 12PLY STRL (GAUZE/BANDAGES/DRESSINGS) ×1 IMPLANT
GLOVE SRG 8 PF TXTR STRL LF DI (GLOVE) ×1 IMPLANT
GLOVE SURG ENC MOIS LTX SZ6.5 (GLOVE) ×4 IMPLANT
GLOVE SURG ENC MOIS LTX SZ8 (GLOVE) ×4 IMPLANT
GLOVE SURG UNDER POLY LF SZ7 (GLOVE) ×4 IMPLANT
GLOVE SURG UNDER POLY LF SZ8 (GLOVE) ×2
GOWN STRL REUS W/TWL LRG LVL3 (GOWN DISPOSABLE) ×4 IMPLANT
HANDPIECE INTERPULSE COAX TIP (DISPOSABLE) ×2
HEAD FEMORAL 22-0 COCR SROM (Hips) ×1 IMPLANT
HOLDER FOLEY CATH W/STRAP (MISCELLANEOUS) IMPLANT
IMMOBILIZER KNEE 20 (SOFTGOODS) ×2
IMMOBILIZER KNEE 20 THIGH 36 (SOFTGOODS) IMPLANT
KIT BASIN OR (CUSTOM PROCEDURE TRAY) ×2 IMPLANT
KIT TURNOVER KIT A (KITS) ×2 IMPLANT
LINER DM PINNACLE HIP 52/45 (Liner) ×1 IMPLANT
LINER PE MENTUM 22.2X45 (Liner) ×1 IMPLANT
MANIFOLD NEPTUNE II (INSTRUMENTS) ×2 IMPLANT
NDL SAFETY ECLIPSE 18X1.5 (NEEDLE) ×1 IMPLANT
NEEDLE HYPO 18GX1.5 SHARP (NEEDLE) ×2
NS IRRIG 1000ML POUR BTL (IV SOLUTION) ×2 IMPLANT
PACK TOTAL JOINT (CUSTOM PROCEDURE TRAY) ×2 IMPLANT
PASSER SUT SWANSON 36MM LOOP (INSTRUMENTS) ×1 IMPLANT
PENCIL SMOKE EVACUATOR COATED (MISCELLANEOUS) ×2 IMPLANT
PROTECTOR NERVE ULNAR (MISCELLANEOUS) ×2 IMPLANT
SET HNDPC FAN SPRY TIP SCT (DISPOSABLE) IMPLANT
SPONGE LAP 18X18 RF (DISPOSABLE) IMPLANT
STAPLER VISISTAT 35W (STAPLE) IMPLANT
STRIP CLOSURE SKIN 1/2X4 (GAUZE/BANDAGES/DRESSINGS) ×2 IMPLANT
SUCTION FRAZIER HANDLE 12FR (TUBING) ×2
SUCTION TUBE FRAZIER 12FR DISP (TUBING) ×1 IMPLANT
SUT ETHIBOND NAB CT1 #1 30IN (SUTURE) ×4 IMPLANT
SUT MNCRL AB 4-0 PS2 18 (SUTURE) ×1 IMPLANT
SUT STRATAFIX 0 PDS 27 VIOLET (SUTURE) ×2
SUT VIC AB 2-0 CT1 27 (SUTURE) ×6
SUT VIC AB 2-0 CT1 TAPERPNT 27 (SUTURE) ×3 IMPLANT
SUTURE STRATFX 0 PDS 27 VIOLET (SUTURE) ×1 IMPLANT
SWAB COLLECTION DEVICE MRSA (MISCELLANEOUS) IMPLANT
SWAB CULTURE ESWAB REG 1ML (MISCELLANEOUS) IMPLANT
SYR 50ML LL SCALE MARK (SYRINGE) ×1 IMPLANT
TOWEL OR 17X26 10 PK STRL BLUE (TOWEL DISPOSABLE) ×4 IMPLANT
TRAY FOLEY MTR SLVR 16FR STAT (SET/KITS/TRAYS/PACK) ×1 IMPLANT
TUBE SUCTION HIGH CAP CLEAR NV (SUCTIONS) ×1 IMPLANT
WATER STERILE IRR 1000ML POUR (IV SOLUTION) ×4 IMPLANT

## 2020-08-06 NOTE — Interval H&P Note (Signed)
History and Physical Interval Note:  08/06/2020 3:24 PM  Nancy Baxter  has presented today for surgery, with the diagnosis of recurrent dislocation left hip.  The various methods of treatment have been discussed with the patient and family. After consideration of risks, benefits and other options for treatment, the patient has consented to  Procedure(s) with comments: Left hip bearing surface revision (Left) - 63min as a surgical intervention.  The patient's history has been reviewed, patient examined, no change in status, stable for surgery.  I have reviewed the patient's chart and labs.  Questions were answered to the patient's satisfaction.     Pilar Plate Morene Cecilio

## 2020-08-06 NOTE — Addendum Note (Signed)
Addendum  created 08/06/20 1944 by Rosaland Lao, CRNA   Charge Capture section accepted

## 2020-08-06 NOTE — Anesthesia Preprocedure Evaluation (Addendum)
Anesthesia Evaluation  Patient identified by MRN, date of birth, ID band Patient awake    Reviewed: Allergy & Precautions, H&P , NPO status , Patient's Chart, lab work & pertinent test results, reviewed documented beta blocker date and time   History of Anesthesia Complications (+) PONV and history of anesthetic complications  Airway Mallampati: II   Neck ROM: full    Dental  (+) Dental Advisory Given, Teeth Intact   Pulmonary    breath sounds clear to auscultation       Cardiovascular hypertension, Pt. on home beta blockers and Pt. on medications + Valvular Problems/Murmurs  Rhythm:regular Rate:Normal + Systolic murmurs    Neuro/Psych  Neuromuscular disease    GI/Hepatic GERD  ,  Endo/Other    Renal/GU      Musculoskeletal  (+) Arthritis ,   Abdominal   Peds  Hematology  (+) anemia ,   Anesthesia Other Findings   Reproductive/Obstetrics                            Anesthesia Physical  Anesthesia Plan  ASA: II  Anesthesia Plan: General   Post-op Pain Management:    Induction: Intravenous  PONV Risk Score and Plan: 4 or greater and Ondansetron, Dexamethasone, Treatment may vary due to age or medical condition and Diphenhydramine  Airway Management Planned: Oral ETT and LMA  Additional Equipment: None  Intra-op Plan:   Post-operative Plan: Extubation in OR  Informed Consent: I have reviewed the patients History and Physical, chart, labs and discussed the procedure including the risks, benefits and alternatives for the proposed anesthesia with the patient or authorized representative who has indicated his/her understanding and acceptance.       Plan Discussed with: CRNA  Anesthesia Plan Comments:        Anesthesia Quick Evaluation

## 2020-08-06 NOTE — Anesthesia Postprocedure Evaluation (Signed)
Anesthesia Post Note  Patient: Nancy Baxter  Procedure(s) Performed: Left hip bearing surface revision (Left Hip)     Patient location during evaluation: PACU Anesthesia Type: General Level of consciousness: sedated and patient cooperative Pain management: pain level controlled Vital Signs Assessment: post-procedure vital signs reviewed and stable Respiratory status: spontaneous breathing Cardiovascular status: stable Anesthetic complications: no   No complications documented.  Last Vitals:  Vitals:   08/06/20 1945 08/06/20 2000  BP: (!) 175/91 (!) 166/86  Pulse: 72 63  Resp: 13 17  Temp:    SpO2: 99% 94%    Last Pain:  Vitals:   08/06/20 2000  TempSrc:   PainSc: [place]

## 2020-08-06 NOTE — Transfer of Care (Signed)
Immediate Anesthesia Transfer of Care Note  Patient: Nancy Baxter  Procedure(s) Performed: Left hip bearing surface revision (Left Hip)  Patient Location: PACU  Anesthesia Type:General  Level of Consciousness: awake, alert  and oriented  Airway & Oxygen Therapy: Patient Spontanous Breathing and Patient connected to face mask  Post-op Assessment: Report given to RN and Post -op Vital signs reviewed and stable  Post vital signs: Reviewed and stable  Last Vitals:  Vitals Value Taken Time  BP 127/69 08/06/20 1900  Temp    Pulse 65 08/06/20 1903  Resp 15 08/06/20 1903  SpO2 98 % 08/06/20 1903  Vitals shown include unvalidated device data.  Last Pain:  Vitals:   08/06/20 1451  TempSrc:   PainSc: 0-No pain      Patients Stated Pain Goal: 3 (49/44/96 [id number])  Complications: No complications documented.

## 2020-08-06 NOTE — Addendum Note (Signed)
Addendum  created 08/06/20 2017 by Nolon Nations, MD   Clinical Note Signed, Delete clinical note

## 2020-08-06 NOTE — Anesthesia Procedure Notes (Signed)
Procedure Name: Intubation Date/Time: 08/06/2020 5:25 PM Performed by: Gerald Leitz, CRNA Pre-anesthesia Checklist: Patient identified, Patient being monitored, Timeout performed, Emergency Drugs available and Suction available Patient Re-evaluated:Patient Re-evaluated prior to induction Oxygen Delivery Method: Circle system utilized Preoxygenation: Pre-oxygenation with 100% oxygen Induction Type: IV induction Ventilation: Mask ventilation without difficulty Laryngoscope Size: Mac and 3 Grade View: Grade I Tube type: Oral Tube size: 7.0 mm Number of attempts: 1 Placement Confirmation: ETT inserted through vocal cords under direct vision,  positive ETCO2 and breath sounds checked- equal and bilateral Secured at: 21 cm Tube secured with: Tape Dental Injury: Teeth and Oropharynx as per pre-operative assessment

## 2020-08-06 NOTE — Anesthesia Postprocedure Evaluation (Deleted)
Anesthesia Post Note  Patient: Nancy Baxter  Procedure(s) Performed: Left hip bearing surface revision (Left Hip)     Patient location during evaluation: PACU Anesthesia Type: General Level of consciousness: sedated and patient cooperative Pain management: pain level controlled Vital Signs Assessment: post-procedure vital signs reviewed and stable Respiratory status: spontaneous breathing Cardiovascular status: stable Anesthetic complications: no   No complications documented.  Last Vitals:  Vitals:   08/06/20 1900 08/06/20 1915  BP: 127/69 (!) 149/70  Pulse: 65 60  Resp: 14 13  Temp: 36.4 C   SpO2: 99% 98%    Last Pain:  Vitals:   08/06/20 1915  TempSrc:   PainSc: Asleep                 Nolon Nations

## 2020-08-06 NOTE — Discharge Instructions (Signed)
Dr. Ollen Gross Total Joint Specialist [facility] [address], [address] [place], [place] [address] [phone]  POSTOPERATIVE DIRECTIONS  Hip Rehabilitation, Guidelines Following Surgery  The results of a hip operation are greatly improved after range of motion and muscle strengthening exercises. Follow all safety measures which are given to protect your hip. If any of these exercises cause increased pain or swelling in your joint, decrease the amount until you are comfortable again. Then slowly increase the exercises. Call your caregiver if you have problems or questions.   BLOOD CLOT PREVENTION . Take a 325 mg Aspirin two times a day for three weeks following surgery. Then take an 81 mg Aspirin once a day for three weeks. Then discontinue Aspirin. Nancy Baxter may resume your vitamins/supplements upon discharge from the hospital. . Do not take any NSAIDs (Advil, Aleve, Ibuprofen, Meloxicam, etc.) until you have discontinued the 325 mg Aspirin.  PRECAUTIONS (6 WEEKS FOLLOWING SURGERY) . Do not bend your hip past a 90 degree angle . Do not cross your legs. . Don't twist your hip inwards- keep knees and toes pointed upwards   HOME CARE INSTRUCTIONS  . Remove items at home which could result in a fall. This includes throw rugs or furniture in walking pathways.   ICE to the affected hip every three hours for 30 minutes at a time and then as needed for pain and swelling.  Continue to use ice on the hip for pain and swelling from surgery. You may notice swelling that will progress down to the foot and ankle.  This is normal after surgery.  Elevate the leg when you are not up walking on it.    Continue to use the breathing machine which will help keep your temperature down.  It is common for your temperature to cycle up and down following surgery, especially at night when you are not up moving around and exerting yourself.  The breathing machine keeps your lungs expanded and your  temperature down.  DIET You may resume your previous home diet once your are discharged from the hospital.  DRESSING / WOUND CARE / SHOWERING Keep the surgical dressing until follow up.  The dressing is water proof, so you can shower without any extra covering.  IF THE DRESSING FALLS OFF or the wound gets wet inside, change the dressing with sterile gauze.  Please use good hand washing techniques before changing the dressing.  Do not use any lotions or creams on the incision until instructed by your surgeon.   You may start showering once you are discharged home but do not submerge the incision under water. Just pat the incision dry and apply a dry gauze dressing on daily. Change the surgical dressing daily and reapply a dry dressing each time.  ACTIVITY Walk with your walker as instructed. Use walker as long as suggested by your caregivers. Avoid periods of inactivity such as sitting longer than an hour when not asleep. This helps prevent blood clots.  You may resume a sexual relationship in one month or when given the OK by your doctor.  You may return to work once you are cleared by your doctor.  Do not drive a car for 6 weeks or until released by you surgeon.  Do not drive while taking narcotics.  WEIGHT BEARING Weight bearing as tolerated with assist device (walker, cane, etc) as directed, use it as long as suggested by your surgeon or therapist, typically at least 4-6 weeks.  POST-OPERATIVE OPIOID TAPER  INSTRUCTIONS: . It is important to wean off of your opioid medication as soon as possible. If you do not need pain medication after your surgery it is ok to stop day one. Marland Kitchen Opioids include: o Codeine, Hydrocodone(Norco, Vicodin), Oxycodone(Percocet, oxycontin) and hydromorphone amongst others.  . Long term and even short term use of opiods can cause: o Increased pain response o Dependence o Constipation o Depression o Respiratory depression o And more.  . Withdrawal symptoms can  include o Flu like symptoms o Nausea, vomiting o And more . Techniques to manage these symptoms o Hydrate well o Eat regular healthy meals o Stay active o Use relaxation techniques(deep breathing, meditating, yoga) . Do Not substitute Alcohol to help with tapering . If you have been on opioids for less than two weeks and do not have pain than it is ok to stop all together.  . Plan to wean off of opioids o This plan should start within one week post op of your joint replacement. o Maintain the same interval or time between taking each dose and first decrease the dose.  o Cut the total daily intake of opioids by one tablet each day o Next start to increase the time between doses. o The last dose that should be eliminated is the evening dose.      POSTOPERATIVE CONSTIPATION PROTOCOL Constipation - defined medically as fewer than three stools per week and severe constipation as less than one stool per week.  One of the most common issues patients have following surgery is constipation.  Even if you have a regular bowel pattern at home, your normal regimen is likely to be disrupted due to multiple reasons following surgery.  Combination of anesthesia, postoperative narcotics, change in appetite and fluid intake all can affect your bowels.  In order to avoid complications following surgery, here are some recommendations in order to help you during your recovery period.  Colace (docusate) - Pick up an over-the-counter form of Colace or another stool softener and take twice a day as long as you are requiring postoperative pain medications.  Take with a full glass of water daily.  If you experience loose stools or diarrhea, hold the colace until you stool forms back up.  If your symptoms do not get better within 1 week or if they get worse, check with your doctor.  Dulcolax (bisacodyl) - Pick up over-the-counter and take as directed by the product packaging as needed to assist with the movement of  your bowels.  Take with a full glass of water.  Use this product as needed if not relieved by Colace only.   MiraLax (polyethylene glycol) - Pick up over-the-counter to have on hand.  MiraLax is a solution that will increase the amount of water in your bowels to assist with bowel movements.  Take as directed and can mix with a glass of water, juice, soda, coffee, or tea.  Take if you go more than two days without a movement. Do not use MiraLax more than once per day. Call your doctor if you are still constipated or irregular after using this medication for 7 days in a row.  If you continue to have problems with postoperative constipation, please contact the office for further assistance and recommendations.  If you experience "the worst abdominal pain ever" or develop nausea or vomiting, please contact the office immediatly for further recommendations for treatment.  ITCHING  If you experience itching with your medications, try taking only a single pain pill,  or even half a pain pill at a time.  You can also use Benadryl over the counter for itching or also to help with sleep.   TED HOSE STOCKINGS Wear the elastic stockings on both legs for three weeks following surgery during the day but you may remove then at night for sleeping.  MEDICATIONS See your medication summary on the "After Visit Summary" that the nursing staff will review with you prior to discharge.  You may have some home medications which will be placed on hold until you complete the course of blood thinner medication.  It is important for you to complete the blood thinner medication as prescribed by your surgeon.  Continue your approved medications as instructed at time of discharge.  PRECAUTIONS If you experience chest pain or shortness of breath - call 911 immediately for transfer to the hospital emergency department.  If you develop a fever greater that 101 F, purulent drainage from wound, increased redness or drainage from  wound, foul odor from the wound/dressing, or calf pain - CONTACT YOUR SURGEON.                                                   FOLLOW-UP APPOINTMENTS Make sure you keep all of your appointments after your operation with your surgeon and caregivers. You should call the office at the above phone number and make an appointment for approximately two weeks after the date of your surgery or on the date instructed by your surgeon outlined in the "After Visit Summary".  RANGE OF MOTION AND STRENGTHENING EXERCISES  These exercises are designed to help you keep full movement of your hip joint. Follow your caregiver's or physical therapist's instructions. Perform all exercises about fifteen times, three times per day or as directed. Exercise both hips, even if you have had only one joint replacement. These exercises can be done on a training (exercise) mat, on the floor, on a table or on a bed. Use whatever works the best and is most comfortable for you. Use music or television while you are exercising so that the exercises are a pleasant break in your day. This will make your life better with the exercises acting as a break in routine you can look forward to.  . Lying on your back, slowly slide your foot toward your buttocks, raising your knee up off the floor. Then slowly slide your foot back down until your leg is straight again.  . Lying on your back spread your legs as far apart as you can without causing discomfort.  . Lying on your side, raise your upper leg and foot straight up from the floor as far as is comfortable. Slowly lower the leg and repeat.  . Lying on your back, tighten up the muscle in the front of your thigh (quadriceps muscles). You can do this by keeping your leg straight and trying to raise your heel off the floor. This helps strengthen the largest muscle supporting your knee.  . Lying on your back, tighten up the muscles of your buttocks both with the legs straight and with the knee bent  at a comfortable angle while keeping your heel on the floor.   IF YOU ARE TRANSFERRED TO A SKILLED REHAB FACILITY If the patient is transferred to a skilled rehab facility following release from the hospital, a list of  the current medications will be sent to the facility for the patient to continue.  When discharged from the skilled rehab facility, please have the facility set up the patient's [place] prior to being released. Also, the skilled facility will be responsible for providing the patient with their medications at time of release from the facility to include their pain medication, the muscle relaxants, and their blood thinner medication. If the patient is still at the rehab facility at time of the two week follow up appointment, the skilled rehab facility will also need to assist the patient in arranging follow up appointment in our office and any transportation needs.  MAKE SURE YOU:  . Understand these instructions.  . Get help right away if you are not doing well or get worse.    Pick up stool softner and laxative for home use following surgery while on pain medications. Do not submerge incision under water. Please use good hand washing techniques while changing dressing each day. May shower starting three days after surgery. Please use a clean towel to pat the incision dry following showers. Continue to use ice for pain and swelling after surgery. Do not use any lotions or creams on the incision until instructed by your surgeon.

## 2020-08-06 NOTE — Brief Op Note (Signed)
08/06/2020  6:26 PM  PATIENT:  Nancy Baxter  80 y.o. female  PRE-OPERATIVE DIAGNOSIS:  recurrent dislocation left hip  POST-OPERATIVE DIAGNOSIS:  recurrent dislocation left hip  PROCEDURE:  Procedure(s) with comments: Left hip bearing surface revision (Left) - 90min  SURGEON:  Surgeon(s) and Role:    Gaynelle Arabian, MD - Primary  PHYSICIAN ASSISTANT:   ASSISTANTS: Theresa Duty, PA-C   ANESTHESIA:   general  EBL:  100 mL   BLOOD ADMINISTERED:none  DRAINS: none   LOCAL MEDICATIONS USED:  NONE  COUNTS:  YES  TOURNIQUET:  * No tourniquets in log *  DICTATION: .Other Dictation: Dictation Number [id number]  PLAN OF CARE: Admit for overnight observation  PATIENT DISPOSITION:  PACU - hemodynamically stable.

## 2020-08-07 ENCOUNTER — Encounter (HOSPITAL_COMMUNITY): Payer: Self-pay | Admitting: Orthopedic Surgery

## 2020-08-07 ENCOUNTER — Other Ambulatory Visit (HOSPITAL_COMMUNITY): Payer: Self-pay

## 2020-08-07 LAB — CBC
HCT: 36.8 % (ref 36.0–46.0)
Hemoglobin: 12.4 g/dL (ref 12.0–15.0)
MCH: 32.3 pg (ref 26.0–34.0)
MCHC: 33.7 g/dL (ref 30.0–36.0)
MCV: 95.8 fL (ref 80.0–100.0)
Platelets: 142 10*3/uL — ABNORMAL LOW (ref 150–400)
RBC: 3.84 MIL/uL — ABNORMAL LOW (ref 3.87–5.11)
RDW: 11.9 % (ref 11.5–15.5)
WBC: 6.4 10*3/uL (ref 4.0–10.5)
nRBC: 0 % (ref 0.0–0.2)

## 2020-08-07 LAB — BASIC METABOLIC PANEL
Anion gap: 6 (ref 5–15)
BUN: 34 mg/dL — ABNORMAL HIGH (ref 8–23)
CO2: 24 mmol/L (ref 22–32)
Calcium: 8.7 mg/dL — ABNORMAL LOW (ref 8.9–10.3)
Chloride: 103 mmol/L (ref 98–111)
Creatinine, Ser: 1.35 mg/dL — ABNORMAL HIGH (ref 0.44–1.00)
GFR, Estimated: 40 mL/min — ABNORMAL LOW (ref >60)
Glucose, Bld: 136 mg/dL — ABNORMAL HIGH (ref 70–99)
Potassium: 4.5 mmol/L (ref 3.5–5.1)
Sodium: 133 mmol/L — ABNORMAL LOW (ref 135–145)

## 2020-08-07 MED ORDER — ASPIRIN 325 MG PO TBEC
325.0000 mg | DELAYED_RELEASE_TABLET | Freq: Two times a day (BID) | ORAL | 0 refills | Status: DC
  Filled 2020-08-07: qty 42, 21d supply, fill #0

## 2020-08-07 MED ORDER — HYDROCODONE-ACETAMINOPHEN 5-325 MG PO TABS
1.0000 | ORAL_TABLET | Freq: Four times a day (QID) | ORAL | 0 refills | Status: DC | PRN
  Filled 2020-08-07: qty 42, 6d supply, fill #0

## 2020-08-07 MED ORDER — TRAMADOL HCL 50 MG PO TABS
50.0000 mg | ORAL_TABLET | Freq: Two times a day (BID) | ORAL | 0 refills | Status: DC | PRN
  Filled 2020-08-07: qty 30, 5d supply, fill #0

## 2020-08-07 MED ORDER — METHOCARBAMOL 500 MG PO TABS
500.0000 mg | ORAL_TABLET | Freq: Four times a day (QID) | ORAL | 0 refills | Status: DC | PRN
  Filled 2020-08-07: qty 40, 10d supply, fill #0

## 2020-08-07 NOTE — Op Note (Signed)
NAME: Nancy Baxter, Nancy Baxter MEDICAL RECORD NO: [id number] ACCOUNT NO: [id number] DATE OF BIRTH: 18-Apr-1940 FACILITY: [facility] LOCATION: [facility] PHYSICIAN: Dione Plover. Vielka Klinedinst, MD  Operative Report   DATE OF PROCEDURE: 08/06/2020  PREOPERATIVE DIAGNOSIS:  Recurrent dislocations, left hip.  POSTOPERATIVE DIAGNOSIS:  Recurrent dislocations, left hip.  PROCEDURE:  Left hip bearing surface revision to dual mobility construct.  SURGEON:  Dione Plover. Farryn Linares, MD . ASSISTANT:  Theresa Duty, PA-C   ANESTHESIA:  General.  ESTIMATED BLOOD LOSS:  100 mL  DRAINS:  None.  COMPLICATIONS:  None.  CONDITION:  Stable to recovery.  BRIEF CLINICAL NOTE:  The patient is an 80 year old female who has had 2 recent hip dislocations.  She previously had a metal-on-metal hip, which had a bearing surface revision to a metal on polyethylene over a year ago.  She has a fused back and in the  past couple of months, she has had 2 dislocations.  She presents now for a bearing surface revision to a constrained versus dual mobility construct.  DESCRIPTION OF PROCEDURE:  After successful administration of general anesthetic, the patient was placed in the right lateral decubitus position with the left side up and held with a hip positioner.  Left lower extremity was isolated from the perineum  with plastic drapes and prepped and draped in the usual sterile fashion.  A short posterolateral incision was made with a 10 blade through the subcutaneous tissue to the fascia lata, which was incised in line with the skin incision.  Sciatic nerve was  palpated and protected and the posterior soft tissues isolated off the femur.  Hip was dislocated and the femoral head is removed.  The femur was retracted anteriorly to gain acetabular exposure.  Acetabular retractors were placed.  The acetabulum was in  excellent position and was well fixed.  I cleared the soft tissue from around the rim of the acetabulum.  We then removed the polyethylene  from the acetabular shell.  I thoroughly irrigated this area with saline.  Then, we put the metal liner for the  dual mobility construct for the 50 mm shell.  This was impacted and it was found to be locked in place.  We then built the construct on the back table with a 28 head and the 49 poly for the dual mobility surface.  It was locked in position and head was  placed onto the femoral neck and impacted.  The hip was reduced with outstanding stability through full range of motion.  Wound was copiously irrigated with saline solution.  There was no capsule to close.  The fascia lata was closed with a running 0  Stratafix suture.  Subcutaneous was closed with interrupted 2-0 Vicryl, subcuticular running 4-0 Monocryl.  The incision was cleaned and dried and Steri-Strips and a sterile dressing applied.  She was awakened and transported to recovery in stable  condition.  Please note that a surgical assistant was of medical necessity for this to do it in a safe and expeditious manner.  Surgical assistant was necessary for proper positioning of the limb for safe removal of the old implant, safe and accurate placement of  the new implant.   PAA D: 08/06/2020 6:30:52 pm T: 08/07/2020 8:53:00 am  JOB: [id number]/ [id number]

## 2020-08-07 NOTE — Progress Notes (Signed)
Physical Therapy Treatment Patient Details Name: Nancy Baxter MRN: [id number] DOB: June 22, 1940 Today's Date: 08/07/2020    History of Present Illness Pt s/p L THR revision and with hx of bil THR    PT Comments    Pt motivated and progressing well with mobility.  Pt up to ambulate limited distance in hall and negotiated stairs.  Pt reviewed car transfers and lower body dressing but declined HEP review - pt states she has HEP and feels she will have no problems.  Pt eager for dc home this date.  Pt's dtr present for entire session.  Follow Up Recommendations  Follow surgeon's recommendation for DC plan and follow-up therapies     Equipment Recommendations  None recommended by PT    Recommendations for Other Services OT consult     Precautions / Restrictions Precautions Precautions: Posterior Hip;Fall Precaution Booklet Issued: Yes (comment) Precaution Comments: Pt recalls 2/3 THP without cues Restrictions Weight Bearing Restrictions: No LLE Weight Bearing: Weight bearing as tolerated    Mobility  Bed Mobility Overal bed mobility: Needs Assistance Bed Mobility: Supine to Sit     Supine to sit: Min guard;Supervision     General bed mobility comments: Pt up in chair and requests back to same    Transfers Overall transfer level: Needs assistance Equipment used: Rolling walker (2 wheeled) Transfers: Sit to/from Stand Sit to Stand: Supervision         General transfer comment: cues for LE management, use of UEs to self assist and adherence to THP  Ambulation/Gait Ambulation/Gait assistance: Min guard;Supervision Gait Distance (Feet): 75 Feet Assistive device: Rolling walker (2 wheeled) Gait Pattern/deviations: Step-to pattern;Step-through pattern;Decreased step length - right;Decreased step length - left;Shuffle;Trunk flexed Gait velocity: decr   General Gait Details: cues for sequence, posture and position from RW   Stairs Stairs: Yes Stairs assistance:  Min assist Stair Management: Two rails;Step to pattern;Forwards;No rails;Backwards;With walker Number of Stairs: 7 General stair comments: cues for sequence with assist to manage RW.  Pt up/down 5 with bilat rails and up/dwn single step bkwd twice for stool into high bed   Wheelchair Mobility    Modified Rankin (Stroke Patients Only)       Balance Overall balance assessment: Needs assistance Sitting-balance support: Feet supported;No upper extremity supported Sitting balance-Leahy Scale: Good     Standing balance support: No upper extremity supported Standing balance-Leahy Scale: Fair                              Cognition Arousal/Alertness: Awake/alert Behavior During Therapy: WFL for tasks assessed/performed Overall Cognitive Status: Within Functional Limits for tasks assessed                                        Exercises Total Joint Exercises Ankle Circles/Pumps: AROM;Both;15 reps;Supine Quad Sets: AROM;Both;10 reps;Supine Heel Slides: AAROM;15 reps;Supine;Left Hip ABduction/ADduction: AAROM;Left;15 reps;Supine Long Arc Quad: AROM;Left;10 reps;Seated    General Comments        Pertinent Vitals/Pain Pain Assessment: 0-10 Pain Score: 4  Pain Location: L hip Pain Descriptors / Indicators: Aching;Sore;Burning Pain Intervention(s): Limited activity within patient's tolerance;Monitored during session;Premedicated before session;Ice applied    Home Living Family/patient expects to be discharged to:: Private residence Living Arrangements: Alone Available Help at Discharge: Family Type of Home: House Home Access: Stairs to enter Entrance Stairs-Rails: Right;Left;Can reach both  Home Layout: One level Home Equipment: Walker - 2 wheels;Cane - single point;Toilet riser      Prior Function Level of Independence: Independent          PT Goals (current goals can now be found in the care plan section) Acute Rehab PT Goals Patient  Stated Goal: Regain IND PT Goal Formulation: With patient Time For Goal Achievement: 08/09/20 Potential to Achieve Goals: Good Progress towards PT goals: Progressing toward goals    Frequency    7X/week      PT Plan Current plan remains appropriate    Co-evaluation              AM-PAC PT "6 Clicks" Mobility   Outcome Measure  Help needed turning from your back to your side while in a flat bed without using bedrails?: A Little Help needed moving from lying on your back to sitting on the side of a flat bed without using bedrails?: A Little Help needed moving to and from a bed to a chair (including a wheelchair)?: A Little Help needed standing up from a chair using your arms (e.g., wheelchair or bedside chair)?: A Little Help needed to walk in hospital room?: A Little Help needed climbing 3-5 steps with a railing? : A Little 6 Click Score: 18    End of Session Equipment Utilized During Treatment: Gait belt Activity Tolerance: Patient tolerated treatment well Patient left: in chair;with call bell/phone within reach;with chair alarm set;with family/visitor present Nurse Communication: Mobility status PT Visit Diagnosis: Unsteadiness on feet (R26.81);Difficulty in walking, not elsewhere classified (R26.2)     Time: 6803-2122 PT Time Calculation (min) (ACUTE ONLY): 18 min  Charges:  $Gait Training: 8-22 mins $Therapeutic Exercise: 8-22 mins                     [place] Pager [phone] Office [phone]    Jashun Puertas 08/07/2020, 1:43 PM

## 2020-08-07 NOTE — Plan of Care (Signed)
POC initiated 

## 2020-08-07 NOTE — Evaluation (Signed)
Physical Therapy Evaluation Patient Details Name: Nancy Baxter MRN: [id number] DOB: 1940/05/14 Today's Date: 08/07/2020   History of Present Illness  Pt s/p L THR revision and with hx of bil THR  Clinical Impression  Pt s/p L THR revision and presents with decreased L LE strength/ROM, post op pain and posterior THP limiting functional mobility.  Pt should progress to dc home with family assist.    Follow Up Recommendations Follow surgeon's recommendation for DC plan and follow-up therapies    Equipment Recommendations  None recommended by PT    Recommendations for Other Services OT consult     Precautions / Restrictions Precautions Precautions: Posterior Hip;Fall Precaution Booklet Issued: Yes (comment) Restrictions Weight Bearing Restrictions: No LLE Weight Bearing: Weight bearing as tolerated      Mobility  Bed Mobility Overal bed mobility: Needs Assistance Bed Mobility: Supine to Sit     Supine to sit: Min guard;Supervision     General bed mobility comments: cues for aderence to THP    Transfers Overall transfer level: Needs assistance Equipment used: Rolling walker (2 wheeled) Transfers: Sit to/from Stand Sit to Stand: Min guard;Supervision         General transfer comment: cues for LE management, use of UEs to self assist and adherence to THP  Ambulation/Gait Ambulation/Gait assistance: Min assist;Min guard Gait Distance (Feet): 120 Feet Assistive device: Rolling walker (2 wheeled) Gait Pattern/deviations: Step-to pattern;Step-through pattern;Decreased step length - right;Decreased step length - left;Shuffle;Trunk flexed Gait velocity: decr   General Gait Details: cues for sequence, posture and position from [employer]            Wheelchair Mobility    Modified Rankin (Stroke Patients Only)       Balance Overall balance assessment: Needs assistance Sitting-balance support: Feet supported;No upper extremity supported Sitting  balance-Leahy Scale: Good     Standing balance support: Bilateral upper extremity supported Standing balance-Leahy Scale: Poor                               Pertinent Vitals/Pain Pain Assessment: 0-10 Pain Score: 4  Pain Location: L hip Pain Descriptors / Indicators: Aching;Sore;Burning Pain Intervention(s): Limited activity within patient's tolerance;Monitored during session;Premedicated before session    Home Living Family/patient expects to be discharged to:: Private residence Living Arrangements: Alone Available Help at Discharge: Family Type of Home: House Home Access: Stairs to enter Entrance Stairs-Rails: Right;Left;Can reach both Technical brewer of Steps: 3 Home Layout: One level Home Equipment: Environmental consultant - 2 wheels;Cane - single point;Toilet riser      Prior Function Level of Independence: Independent               Hand Dominance        Extremity/Trunk Assessment   Upper Extremity Assessment Upper Extremity Assessment: Overall WFL for tasks assessed    Lower Extremity Assessment Lower Extremity Assessment: LLE deficits/detail LLE Deficits / Details: 2+/5 strength at hip with AAROM at hip to 80 flex and 15 abd    Cervical / Trunk Assessment Cervical / Trunk Assessment: Normal  Communication   Communication: No difficulties  Cognition Arousal/Alertness: Awake/alert Behavior During Therapy: WFL for tasks assessed/performed Overall Cognitive Status: Within Functional Limits for tasks assessed  General Comments      Exercises Total Joint Exercises Ankle Circles/Pumps: AROM;Both;15 reps;Supine Quad Sets: AROM;Both;10 reps;Supine Heel Slides: AAROM;15 reps;Supine;Left Hip ABduction/ADduction: AAROM;Left;15 reps;Supine Long Arc Quad: AROM;Left;10 reps;Seated   Assessment/Plan    PT Assessment Patient needs continued PT services  PT Problem List Decreased strength;Decreased  range of motion;Decreased activity tolerance;Decreased balance;Decreased mobility;Decreased knowledge of use of DME;Pain       PT Treatment Interventions DME instruction;Gait training;Stair training;Functional mobility training;Therapeutic activities;Therapeutic exercise;Patient/family education    PT Goals (Current goals can be found in the Care Plan section)  Acute Rehab PT Goals Patient Stated Goal: Regain IND PT Goal Formulation: With patient Time For Goal Achievement: 08/09/20 Potential to Achieve Goals: Good    Frequency 7X/week   Barriers to discharge        Co-evaluation               AM-PAC PT "6 Clicks" Mobility  Outcome Measure Help needed turning from your back to your side while in a flat bed without using bedrails?: A Little Help needed moving from lying on your back to sitting on the side of a flat bed without using bedrails?: A Little Help needed moving to and from a bed to a chair (including a wheelchair)?: A Little Help needed standing up from a chair using your arms (e.g., wheelchair or bedside chair)?: A Little Help needed to walk in hospital room?: A Little Help needed climbing 3-5 steps with a railing? : A Little 6 Click Score: 18    End of Session Equipment Utilized During Treatment: Gait belt Activity Tolerance: Patient tolerated treatment well Patient left: in chair;with call bell/phone within reach;with chair alarm set;with family/visitor present Nurse Communication: Mobility status PT Visit Diagnosis: Unsteadiness on feet (R26.81);Difficulty in walking, not elsewhere classified (R26.2)    Time: 9509-3267 PT Time Calculation (min) (ACUTE ONLY): 29 min   Charges:   PT Evaluation $PT Eval Low Complexity: 1 Low PT Treatments $Therapeutic Exercise: 8-22 mins        Debe Coder PT Acute Rehabilitation Services Pager [phone] Office [phone]   Kelcy Laible 08/07/2020, 1:34 PM

## 2020-08-07 NOTE — TOC Transition Note (Signed)
Transition of Care [facility]) - CM/SW Discharge Note  Patient Details  Name: Nancy Baxter MRN: [id number] Date of Birth: 1940/11/27  Transition of Care [facility]) CM/SW Contact:  Sherie Don, LCSW Phone Number: 08/07/2020, 10:48 AM  Clinical Narrative: Patient is expected to discharge home today after working with PT. CSW met with patient to confirm discharge plan. Patient to discharge home with a home exercise program (HEP). Patient has a rolling walker, toilet riser, and crutches at home from previous surgeries, so there are no DME needs at this time. TOC signing off.  Final next level of care: Home/Self Care Barriers to Discharge: No Barriers Identified  Patient Goals and CMS Choice Patient states their goals for this hospitalization and ongoing recovery are:: Discharge home with HEP CMS Medicare.gov Compare Post Acute Care list provided to:: Patient Choice offered to / list presented to : NA  Discharge Plan and Services         DME Arranged: N/A DME Agency: NA  Readmission Risk Interventions No flowsheet data found.

## 2020-08-07 NOTE — Progress Notes (Signed)
   Subjective: 1 Day Post-Op Procedure(s) (LRB): Left hip bearing surface revision (Left) Patient reports pain as mild.   Patient seen in rounds by Dr. Wynelle Link. Patient is well, and has had no acute complaints or problems. No acute overnight events. Denies SOB, chest pain, or calf pain.  We will begin therapy today.   Objective: Vital signs in last 24 hours: Temp:  [97.3 F (36.3 C)-97.9 F (36.6 C)] 97.3 F (36.3 C) (04/28 1443) Pulse Rate:  [60-81] 67 (04/28 0632) Resp:  [13-19] 18 (04/28 0632) BP: (127-181)/(69-93) 132/80 (04/28 0632) SpO2:  [94 %-100 %] 99 % (04/28 1540) Weight:  [54 kg] 54 kg (04/27 1451)  Intake/Output from previous day:  Intake/Output Summary (Last 24 hours) at 08/07/2020 0824 Last data filed at 08/07/2020 0600 Gross per 24 hour  Intake 2166.06 ml  Output 100 ml  Net 2066.06 ml     Intake/Output this shift: No intake/output data recorded.  Labs: Recent Labs    08/04/20 1039 08/07/20 0325  HGB 13.2 12.4   Recent Labs    08/04/20 1039 08/07/20 0325  WBC 5.1 6.4  RBC 4.06 3.84*  HCT 39.4 36.8  PLT 140* 142*   Recent Labs    08/04/20 1039 08/07/20 0325  NA 140 133*  K 4.2 4.5  CL 104 103  CO2 24 24  BUN 45* 34*  CREATININE 1.39* 1.35*  GLUCOSE 102* 136*  CALCIUM 9.5 8.7*   Recent Labs    08/04/20 1039  INR 1.0    Exam: General - Patient is Alert and Oriented Extremity - Neurologically intact Neurovascular intact Intact pulses distally Dorsiflexion/Plantar flexion intact Dressing - dressing C/D/I Motor Function - intact, moving foot and toes well on exam.   Past Medical History:  Diagnosis Date  . Anemia    pt. denies  . Arthritis   . Complication of anesthesia   . GERD (gastroesophageal reflux disease)   . Heart murmur    evaluated 70yr ago-no cardiac follow up needed  . Hypertension   . PONV (postoperative nausea and vomiting)   . Restless leg     Assessment/Plan: 1 Day Post-Op Procedure(s) (LRB): Left  hip bearing surface revision (Left) Active Problems:   Recurrent dislocation of left hip  Estimated body mass index is 24.04 kg/m as calculated from the following:   Height as of this encounter: 4\' 11"  (1.499 m).   Weight as of this encounter: 54 kg. Up with therapy  DVT Prophylaxis - Aspirin and TED hose Weight bearing as tolerated D/C knee immobilizer Hemovac pulled without difficulty Begin therapy Hip precautions discussed with patient  Plan is to go Home after hospital stay.  Plan for two sessions with PT today, and if meeting goals, will plan for discharge this afternoon.   Patient to follow up in two weeks with Dr. Wynelle Link in clinic.   The PDMP database was reviewed today prior to any opioid medications being prescribed to this patient.  Fenton Foy, MBA, PA-C Orthopedic Surgery 08/07/2020, 8:24 AM

## 2020-08-07 NOTE — Progress Notes (Signed)
Report received from Christa RN/PACU.  Pt arrived to room 1335 via bed. Education initiated on use of call bell. Pt handbook given to pt. Pt's daughter at bedside.

## 2020-08-13 NOTE — Discharge Summary (Signed)
Physician Discharge Summary   Patient ID: Nancy Baxter MRN: [id number] DOB/AGE: 1940-07-23 80 y.o.  Admit date: 08/06/2020 Discharge date: 08/07/2020  Primary Diagnosis:  Recurrent dislocation left hip, s/p L THA  Admission Diagnoses:  Past Medical History:  Diagnosis Date  . Anemia    pt. denies  . Arthritis   . Complication of anesthesia   . GERD (gastroesophageal reflux disease)   . Heart murmur    evaluated 23yr ago-no cardiac follow up needed  . Hypertension   . PONV (postoperative nausea and vomiting)   . Restless leg    Discharge Diagnoses:   Active Problems:   Recurrent dislocation of left hip  Estimated body mass index is 24.04 kg/m as calculated from the following:   Height as of this encounter: 4\' 11"  (1.499 m).   Weight as of this encounter: 54 kg.  Procedure:  Procedure(s) (LRB): Left hip bearing surface revision (Left)   Consults: None  HPI: The patient is an 80 year old female who has had 2 recent hip dislocations.  She previously had a metal-on-metal hip, which had a bearing surface revision to a metal on polyethylene over a year ago.  She has a fused back and in the  past couple of months, she has had 2 dislocations.  She presents now for a bearing surface revision to a constrained versus dual mobility construct.  Laboratory Data: Admission on 08/06/2020, Discharged on 08/07/2020  Component Date Value Ref Range Status  . WBC 08/07/2020 6.4  4.0 - 10.5 K/uL Final  . RBC 08/07/2020 3.84* 3.87 - 5.11 MIL/uL Final  . Hemoglobin 08/07/2020 12.4  12.0 - 15.0 g/dL Final  . HCT 08/07/2020 36.8  36.0 - 46.0 % Final  . MCV 08/07/2020 95.8  80.0 - 100.0 fL Final  . MCH 08/07/2020 32.3  26.0 - 34.0 pg Final  . MCHC 08/07/2020 33.7  30.0 - 36.0 g/dL Final  . RDW 08/07/2020 11.9  11.5 - 15.5 % Final  . Platelets 08/07/2020 142* 150 - 400 K/uL Final  . nRBC 08/07/2020 0.0  0.0 - 0.2 % Final   Performed at [facility], [place] [address] [place], [place] [address]  . Sodium 08/07/2020 133* 135 - 145 mmol/L Final  . Potassium 08/07/2020 4.5  3.5 - 5.1 mmol/L Final  . Chloride 08/07/2020 103  98 - 111 mmol/L Final  . CO2 08/07/2020 24  22 - 32 mmol/L Final  . Glucose, Bld 08/07/2020 136* 70 - 99 mg/dL Final   Glucose reference range applies only to samples taken after fasting for at least 8 hours.  . BUN 08/07/2020 34* 8 - 23 mg/dL Final  . Creatinine, Ser 08/07/2020 1.35* 0.44 - 1.00 mg/dL Final  . Calcium 08/07/2020 8.7* 8.9 - 10.3 mg/dL Final  . GFR, Estimated 08/07/2020 40* >60 mL/min Final   Comment: (NOTE) Calculated using the CKD-EPI Creatinine Equation (2021)   . Anion gap 08/07/2020 6  5 - 15 Final   Performed at [facility], [place] [address] [place], [place] [address]  Hospital Outpatient Visit on 08/04/2020  Component Date Value Ref Range Status  . SARS Coronavirus 2 08/04/2020 NEGATIVE  NEGATIVE Final   Comment: (NOTE) SARS-CoV-2 target nucleic acids are NOT DETECTED.  The SARS-CoV-2 RNA is generally detectable in upper and lower respiratory specimens during the acute phase of infection. Negative results do not preclude SARS-CoV-2 infection, do not rule out co-infections with other pathogens, and should not be used as the sole  basis for treatment or other patient management decisions. Negative results must be combined with clinical observations, patient history, and epidemiological information. The expected result is Negative.  Fact Sheet for Patients: SugarRoll.be  Fact Sheet for Healthcare Providers: https://www.woods-mathews.com/  This test is not yet approved or cleared by the [place] FDA and  has been authorized for detection and/or diagnosis of SARS-CoV-2 by FDA under an Emergency Use Authorization (EUA). This EUA will remain  in effect (meaning this test can be used) for the duration of the COVID-19 declaration under Se                           ction 564(b)(1) of the Act, 21 U.S.C. section 360bbb-3(b)(1), unless the authorization is terminated or revoked sooner.  Performed at [facility], [place] [address], [place], [place] [address]   Hospital Outpatient Visit on 08/04/2020  Component Date Value Ref Range Status  . WBC 08/04/2020 5.1  4.0 - 10.5 K/uL Final  . RBC 08/04/2020 4.06  3.87 - 5.11 MIL/uL Final  . Hemoglobin 08/04/2020 13.2  12.0 - 15.0 g/dL Final  . HCT 08/04/2020 39.4  36.0 - 46.0 % Final  . MCV 08/04/2020 97.0  80.0 - 100.0 fL Final  . MCH 08/04/2020 32.5  26.0 - 34.0 pg Final  . MCHC 08/04/2020 33.5  30.0 - 36.0 g/dL Final  . RDW 08/04/2020 12.1  11.5 - 15.5 % Final  . Platelets 08/04/2020 140* 150 - 400 K/uL Final  . nRBC 08/04/2020 0.0  0.0 - 0.2 % Final   Performed at [facility], [place] [address] [place], [place] [address]  . Sodium 08/04/2020 140  135 - 145 mmol/L Final  . Potassium 08/04/2020 4.2  3.5 - 5.1 mmol/L Final  . Chloride 08/04/2020 104  98 - 111 mmol/L Final  . CO2 08/04/2020 24  22 - 32 mmol/L Final  . Glucose, Bld 08/04/2020 102* 70 - 99 mg/dL Final   Glucose reference range applies only to samples taken after fasting for at least 8 hours.  . BUN 08/04/2020 45* 8 - 23 mg/dL Final  . Creatinine, Ser 08/04/2020 1.39* 0.44 - 1.00 mg/dL Final  . Calcium 08/04/2020 9.5  8.9 - 10.3 mg/dL Final  . Total Protein 08/04/2020 7.1  6.5 - 8.1 g/dL Final  . Albumin 08/04/2020 4.0  3.5 - 5.0 g/dL Final  . AST 08/04/2020 26  15 - 41 U/L Final  . ALT 08/04/2020 20  0 - 44 U/L Final  . Alkaline Phosphatase 08/04/2020 90  38 - 126 U/L Final  . Total Bilirubin 08/04/2020 0.1* 0.3 - 1.2 mg/dL Final  . GFR, Estimated 08/04/2020 38* >60 mL/min Final   Comment: (NOTE) Calculated using the CKD-EPI Creatinine Equation (2021)   . Anion gap 08/04/2020 12  5 - 15 Final   Performed at [facility], [place] [address] [place], [place] [address]  .  Prothrombin Time 08/04/2020 12.7  11.4 - 15.2 seconds Final  . INR 08/04/2020 1.0  0.8 - 1.2 Final   Comment: (NOTE) INR goal varies based on device and disease states. Performed at [facility], [place] [address] [place], [place] [address]   . ABO/RH(D) 08/04/2020 O POS   Final  . Antibody Screen 08/04/2020 NEG   Final  . Sample Expiration 08/04/2020 08/09/2020,2359   Final  . Extend sample reason 08/04/2020    Final  Value:NO TRANSFUSIONS OR PREGNANCY IN THE PAST 3 MONTHS Performed at [place] [address] [place], [place] [address]      X-Rays:DG Pelvis Portable  Result Date: 08/06/2020 CLINICAL DATA:  Left hip arthroplasty revision EXAM: PORTABLE PELVIS 1-2 VIEWS COMPARISON:  06/27/2020 FINDINGS: Single view radiograph of the pelvis and two view radiograph of the left hip demonstrates surgical changes of bilateral total hip arthroplasty. Arthroplasty components overlie the expected position. No unexpected fracture or dislocation. Heterotopic ossification is seen within the soft tissues superolateral to the left hip. Subcutaneous gas is seen with the soft tissues lateral to the left hip. IMPRESSION: Normal alignment.  No unexpected fracture or dislocation. Electronically Signed   By: Fidela Salisbury MD   On: 08/06/2020 22:00    EKG: Orders placed or performed during the hospital encounter of 06/27/20  . ED EKG  . ED EKG     Hospital Course: HISAKO BUGH is a 80 y.o. who was admitted to [facility]. They were brought to the operating room on 08/06/2020 and underwent Procedure(s): Left hip bearing surface revision.  Patient tolerated the procedure well and was later transferred to the recovery room and then to the orthopaedic floor for postoperative care. They were given PO and IV analgesics for pain control following their surgery. They were given 24 hours of postoperative antibiotics of  Anti-infectives (From  admission, onward)   Start     Dose/Rate Route Frequency Ordered Stop   08/06/20 2300  ceFAZolin (ANCEF) IVPB 2g/100 mL premix        2 g 200 mL/hr over 30 Minutes Intravenous Every 6 hours 08/06/20 2105 08/07/20 0625   08/06/20 1445  ceFAZolin (ANCEF) IVPB 2g/100 mL premix        2 g 200 mL/hr over 30 Minutes Intravenous On call to O.R. 08/06/20 1434 08/06/20 1748     and started on DVT prophylaxis in the form of Aspirin and TED hose.   PT and OT were ordered for total joint protocol. Discharge planning consulted to help with postop disposition and equipment needs.  Patient had an uneventful night on the evening of surgery. They started to get up OOB with therapy on 08/07/20. Pt was seen during rounds and was ready to go home pending progress with therapy. She worked with therapy on POD #1 and was meeting goals. Pt was discharged to home later that day in stable condition.  Diet: Regular diet Activity: WBAT Follow-up: in two weeks Disposition: Home Discharged Condition: good   Discharge Instructions    Call MD / Call 911   Complete by: As directed    If you experience chest pain or shortness of breath, CALL 911 and be transported to the hospital emergency room.  If you develope a fever above 101 F, pus (white drainage) or increased drainage or redness at the wound, or calf pain, call your surgeon's office.   Change dressing   Complete by: As directed    You have an adhesive waterproof bandage over the incision. Leave this in place until your first follow-up appointment. Once you remove this you will not need to place another bandage.   Constipation Prevention   Complete by: As directed    Drink plenty of fluids.  Prune juice may be helpful.  You may use a stool softener, such as Colace (over the counter) 100 mg twice a day.  Use MiraLax (over the counter) for constipation as needed.   Diet - low sodium  heart healthy   Complete by: As directed    Do not sit on low chairs, stoools or  toilet seats, as it may be difficult to get up from low surfaces   Complete by: As directed    Driving restrictions   Complete by: As directed    No driving for two weeks   Post-operative opioid taper instructions:   Complete by: As directed    POST-OPERATIVE OPIOID TAPER INSTRUCTIONS: It is important to wean off of your opioid medication as soon as possible. If you do not need pain medication after your surgery it is ok to stop day one. Opioids include: Codeine, Hydrocodone(Norco, Vicodin), Oxycodone(Percocet, oxycontin) and hydromorphone amongst others.  Long term and even short term use of opiods can cause: Increased pain response Dependence Constipation Depression Respiratory depression And more.  Withdrawal symptoms can include Flu like symptoms Nausea, vomiting And more Techniques to manage these symptoms Hydrate well Eat regular healthy meals Stay active Use relaxation techniques(deep breathing, meditating, yoga) Do Not substitute Alcohol to help with tapering If you have been on opioids for less than two weeks and do not have pain than it is ok to stop all together.  Plan to wean off of opioids This plan should start within one week post op of your joint replacement. Maintain the same interval or time between taking each dose and first decrease the dose.  Cut the total daily intake of opioids by one tablet each day Next start to increase the time between doses. The last dose that should be eliminated is the evening dose.      TED hose   Complete by: As directed    Use stockings (TED hose) for three weeks on both leg(s).  You may remove them at night for sleeping.   Weight bearing as tolerated   Complete by: As directed      Allergies as of 08/07/2020      Reactions   Sulfa Antibiotics Hives   Codeine Nausea And Vomiting   Prednisone Other (See Comments)   Shuts down adrenals   Zolpidem Tartrate Other (See Comments)   sleepwalks   Adhesive [tape] Rash    blisters      Medication List    TAKE these medications   acetaminophen 650 MG CR tablet Commonly known as: TYLENOL Take 1,300 mg by mouth daily.   aspirin 325 MG EC tablet Take 1 tablet (325 mg total) by mouth 2 (two) times daily. Then take one 81 mg aspirin once a day for three weeks. Then discontinue aspirin.   CALTRATE 600+D3 PO Take 1 tablet by mouth once a week.   ferrous sulfate 325 (65 FE) MG tablet Take 325 mg by mouth once a week.   furosemide 20 MG tablet Commonly known as: LASIX Take 40 mg by mouth in the morning.   HYDROcodone-acetaminophen 5-325 MG tablet Commonly known as: NORCO/VICODIN Take 1-2 tablets by mouth every 6 (six) hours as needed for severe pain.   losartan 100 MG tablet Commonly known as: COZAAR Take 100 mg by mouth in the morning.   methocarbamol 500 MG tablet Commonly known as: ROBAXIN Take 1 tablet (500 mg total) by mouth every 6 (six) hours as needed for muscle spasms.   metoprolol succinate 50 MG 24 hr tablet Commonly known as: TOPROL-XL Take 50 mg by mouth every morning.   rOPINIRole 2 MG 24 hr tablet Commonly known as: REQUIP XL Firs dose at 6 PM and second dose at bedtime. What changed:  how much to take  how to take this  when to take this  additional instructions   Systane Ultra 0.4-0.3 % Soln Generic drug: Polyethyl Glycol-Propyl Glycol Place 1-2 drops into both eyes 3 (three) times daily as needed (dry/irritated eyes.).   traMADol 50 MG tablet Commonly known as: ULTRAM Take 1-2 tablets (50-100 mg total) by mouth every 12 (twelve) hours as needed for moderate pain.   Vitamin B-12 2500 MCG Subl Take 5,000 mcg by mouth in the morning.   Zinc 50 MG Tabs Take 50 mg by mouth in the morning.            Discharge Care Instructions  (From admission, onward)         Start     Ordered   08/07/20 0000  Weight bearing as tolerated        08/07/20 0831   08/07/20 0000  Change dressing       Comments: You have  an adhesive waterproof bandage over the incision. Leave this in place until your first follow-up appointment. Once you remove this you will not need to place another bandage.   08/07/20 0831          Follow-up Information    Gaynelle Arabian, MD. Schedule an appointment as soon as possible for a visit on 08/19/2020.   Specialty: Orthopedic Surgery Contact information: [address] [place] [address] [phone]               Signed: Fenton Foy, MBA, PA-C Orthopedic Surgery 08/13/2020, 11:56 AM

## 2020-08-25 DIAGNOSIS — E871 Hypo-osmolality and hyponatremia: Secondary | ICD-10-CM | POA: Diagnosis not present

## 2020-08-25 DIAGNOSIS — M169 Osteoarthritis of hip, unspecified: Secondary | ICD-10-CM | POA: Diagnosis not present

## 2020-08-25 DIAGNOSIS — K219 Gastro-esophageal reflux disease without esophagitis: Secondary | ICD-10-CM | POA: Diagnosis not present

## 2020-08-25 DIAGNOSIS — D696 Thrombocytopenia, unspecified: Secondary | ICD-10-CM | POA: Diagnosis not present

## 2020-08-25 DIAGNOSIS — M1990 Unspecified osteoarthritis, unspecified site: Secondary | ICD-10-CM | POA: Diagnosis not present

## 2020-08-25 DIAGNOSIS — G2581 Restless legs syndrome: Secondary | ICD-10-CM | POA: Diagnosis not present

## 2020-08-25 DIAGNOSIS — I872 Venous insufficiency (chronic) (peripheral): Secondary | ICD-10-CM | POA: Diagnosis not present

## 2020-08-25 DIAGNOSIS — M419 Scoliosis, unspecified: Secondary | ICD-10-CM | POA: Diagnosis not present

## 2020-08-25 DIAGNOSIS — I129 Hypertensive chronic kidney disease with stage 1 through stage 4 chronic kidney disease, or unspecified chronic kidney disease: Secondary | ICD-10-CM | POA: Diagnosis not present

## 2020-08-25 DIAGNOSIS — I8390 Asymptomatic varicose veins of unspecified lower extremity: Secondary | ICD-10-CM | POA: Diagnosis not present

## 2020-08-25 DIAGNOSIS — N1831 Chronic kidney disease, stage 3a: Secondary | ICD-10-CM | POA: Diagnosis not present

## 2020-08-25 DIAGNOSIS — F4320 Adjustment disorder, unspecified: Secondary | ICD-10-CM | POA: Diagnosis not present

## 2020-09-09 DIAGNOSIS — Z96642 Presence of left artificial hip joint: Secondary | ICD-10-CM | POA: Diagnosis not present

## 2020-10-02 DIAGNOSIS — Z01419 Encounter for gynecological examination (general) (routine) without abnormal findings: Secondary | ICD-10-CM | POA: Diagnosis not present

## 2020-10-02 DIAGNOSIS — Z6825 Body mass index (BMI) 25.0-25.9, adult: Secondary | ICD-10-CM | POA: Diagnosis not present

## 2020-10-29 ENCOUNTER — Ambulatory Visit: Payer: PPO | Admitting: Neurology

## 2020-10-30 ENCOUNTER — Encounter: Payer: Self-pay | Admitting: Neurology

## 2020-11-04 DIAGNOSIS — Z96641 Presence of right artificial hip joint: Secondary | ICD-10-CM | POA: Diagnosis not present

## 2020-11-04 DIAGNOSIS — Z96642 Presence of left artificial hip joint: Secondary | ICD-10-CM | POA: Diagnosis not present

## 2020-11-27 DIAGNOSIS — I129 Hypertensive chronic kidney disease with stage 1 through stage 4 chronic kidney disease, or unspecified chronic kidney disease: Secondary | ICD-10-CM | POA: Diagnosis not present

## 2020-11-27 DIAGNOSIS — N1831 Chronic kidney disease, stage 3a: Secondary | ICD-10-CM | POA: Diagnosis not present

## 2020-11-27 DIAGNOSIS — D696 Thrombocytopenia, unspecified: Secondary | ICD-10-CM | POA: Diagnosis not present

## 2020-12-05 DIAGNOSIS — M169 Osteoarthritis of hip, unspecified: Secondary | ICD-10-CM | POA: Diagnosis not present

## 2020-12-05 DIAGNOSIS — M25552 Pain in left hip: Secondary | ICD-10-CM | POA: Diagnosis not present

## 2020-12-05 DIAGNOSIS — I129 Hypertensive chronic kidney disease with stage 1 through stage 4 chronic kidney disease, or unspecified chronic kidney disease: Secondary | ICD-10-CM | POA: Diagnosis not present

## 2020-12-05 DIAGNOSIS — E871 Hypo-osmolality and hyponatremia: Secondary | ICD-10-CM | POA: Diagnosis not present

## 2020-12-05 DIAGNOSIS — M419 Scoliosis, unspecified: Secondary | ICD-10-CM | POA: Diagnosis not present

## 2020-12-05 DIAGNOSIS — Z23 Encounter for immunization: Secondary | ICD-10-CM | POA: Diagnosis not present

## 2020-12-05 DIAGNOSIS — R82998 Other abnormal findings in urine: Secondary | ICD-10-CM | POA: Diagnosis not present

## 2020-12-05 DIAGNOSIS — D696 Thrombocytopenia, unspecified: Secondary | ICD-10-CM | POA: Diagnosis not present

## 2020-12-05 DIAGNOSIS — I872 Venous insufficiency (chronic) (peripheral): Secondary | ICD-10-CM | POA: Diagnosis not present

## 2020-12-05 DIAGNOSIS — N1831 Chronic kidney disease, stage 3a: Secondary | ICD-10-CM | POA: Diagnosis not present

## 2020-12-05 DIAGNOSIS — Z0000 Encounter for general adult medical examination without abnormal findings: Secondary | ICD-10-CM | POA: Diagnosis not present

## 2020-12-05 DIAGNOSIS — G2581 Restless legs syndrome: Secondary | ICD-10-CM | POA: Diagnosis not present

## 2020-12-05 DIAGNOSIS — M25561 Pain in right knee: Secondary | ICD-10-CM | POA: Diagnosis not present

## 2020-12-05 DIAGNOSIS — M1990 Unspecified osteoarthritis, unspecified site: Secondary | ICD-10-CM | POA: Diagnosis not present

## 2020-12-05 DIAGNOSIS — F4320 Adjustment disorder, unspecified: Secondary | ICD-10-CM | POA: Diagnosis not present

## 2020-12-18 ENCOUNTER — Other Ambulatory Visit: Payer: Self-pay | Admitting: Neurology

## 2020-12-18 DIAGNOSIS — G2581 Restless legs syndrome: Secondary | ICD-10-CM

## 2021-02-09 ENCOUNTER — Other Ambulatory Visit (HOSPITAL_COMMUNITY): Payer: Self-pay

## 2021-04-22 DIAGNOSIS — Z96642 Presence of left artificial hip joint: Secondary | ICD-10-CM | POA: Diagnosis not present

## 2021-04-22 DIAGNOSIS — M1711 Unilateral primary osteoarthritis, right knee: Secondary | ICD-10-CM | POA: Diagnosis not present

## 2021-05-05 DIAGNOSIS — M1711 Unilateral primary osteoarthritis, right knee: Secondary | ICD-10-CM | POA: Diagnosis not present

## 2021-05-06 DIAGNOSIS — M25552 Pain in left hip: Secondary | ICD-10-CM | POA: Diagnosis not present

## 2021-05-06 DIAGNOSIS — Z96642 Presence of left artificial hip joint: Secondary | ICD-10-CM | POA: Diagnosis not present

## 2021-05-12 DIAGNOSIS — M1711 Unilateral primary osteoarthritis, right knee: Secondary | ICD-10-CM | POA: Diagnosis not present

## 2021-05-20 DIAGNOSIS — M1711 Unilateral primary osteoarthritis, right knee: Secondary | ICD-10-CM | POA: Diagnosis not present

## 2021-05-26 DIAGNOSIS — Z96642 Presence of left artificial hip joint: Secondary | ICD-10-CM | POA: Diagnosis not present

## 2021-05-26 DIAGNOSIS — M25552 Pain in left hip: Secondary | ICD-10-CM | POA: Diagnosis not present

## 2021-06-02 DIAGNOSIS — Z96642 Presence of left artificial hip joint: Secondary | ICD-10-CM | POA: Diagnosis not present

## 2021-06-02 DIAGNOSIS — M25552 Pain in left hip: Secondary | ICD-10-CM | POA: Diagnosis not present

## 2021-06-09 DIAGNOSIS — I872 Venous insufficiency (chronic) (peripheral): Secondary | ICD-10-CM | POA: Diagnosis not present

## 2021-06-09 DIAGNOSIS — D696 Thrombocytopenia, unspecified: Secondary | ICD-10-CM | POA: Diagnosis not present

## 2021-06-09 DIAGNOSIS — M169 Osteoarthritis of hip, unspecified: Secondary | ICD-10-CM | POA: Diagnosis not present

## 2021-06-09 DIAGNOSIS — F4320 Adjustment disorder, unspecified: Secondary | ICD-10-CM | POA: Diagnosis not present

## 2021-06-09 DIAGNOSIS — M419 Scoliosis, unspecified: Secondary | ICD-10-CM | POA: Diagnosis not present

## 2021-06-09 DIAGNOSIS — M25552 Pain in left hip: Secondary | ICD-10-CM | POA: Diagnosis not present

## 2021-06-09 DIAGNOSIS — K219 Gastro-esophageal reflux disease without esophagitis: Secondary | ICD-10-CM | POA: Diagnosis not present

## 2021-06-09 DIAGNOSIS — G2581 Restless legs syndrome: Secondary | ICD-10-CM | POA: Diagnosis not present

## 2021-06-09 DIAGNOSIS — I129 Hypertensive chronic kidney disease with stage 1 through stage 4 chronic kidney disease, or unspecified chronic kidney disease: Secondary | ICD-10-CM | POA: Diagnosis not present

## 2021-06-09 DIAGNOSIS — E871 Hypo-osmolality and hyponatremia: Secondary | ICD-10-CM | POA: Diagnosis not present

## 2021-06-09 DIAGNOSIS — N1831 Chronic kidney disease, stage 3a: Secondary | ICD-10-CM | POA: Diagnosis not present

## 2021-06-09 DIAGNOSIS — Z96642 Presence of left artificial hip joint: Secondary | ICD-10-CM | POA: Diagnosis not present

## 2021-06-09 DIAGNOSIS — I8390 Asymptomatic varicose veins of unspecified lower extremity: Secondary | ICD-10-CM | POA: Diagnosis not present

## 2021-06-09 DIAGNOSIS — M1990 Unspecified osteoarthritis, unspecified site: Secondary | ICD-10-CM | POA: Diagnosis not present

## 2021-08-09 DIAGNOSIS — I129 Hypertensive chronic kidney disease with stage 1 through stage 4 chronic kidney disease, or unspecified chronic kidney disease: Secondary | ICD-10-CM | POA: Diagnosis not present

## 2021-08-09 DIAGNOSIS — M169 Osteoarthritis of hip, unspecified: Secondary | ICD-10-CM | POA: Diagnosis not present

## 2021-08-09 DIAGNOSIS — N1831 Chronic kidney disease, stage 3a: Secondary | ICD-10-CM | POA: Diagnosis not present

## 2021-08-11 DIAGNOSIS — Z1231 Encounter for screening mammogram for malignant neoplasm of breast: Secondary | ICD-10-CM | POA: Diagnosis not present

## 2021-08-13 DIAGNOSIS — M1711 Unilateral primary osteoarthritis, right knee: Secondary | ICD-10-CM | POA: Diagnosis not present

## 2021-08-31 DIAGNOSIS — M25552 Pain in left hip: Secondary | ICD-10-CM | POA: Diagnosis not present

## 2021-09-04 DIAGNOSIS — M25552 Pain in left hip: Secondary | ICD-10-CM | POA: Diagnosis not present

## 2021-10-28 DIAGNOSIS — Z6825 Body mass index (BMI) 25.0-25.9, adult: Secondary | ICD-10-CM | POA: Diagnosis not present

## 2021-10-28 DIAGNOSIS — Z01419 Encounter for gynecological examination (general) (routine) without abnormal findings: Secondary | ICD-10-CM | POA: Diagnosis not present

## 2021-11-11 DIAGNOSIS — I499 Cardiac arrhythmia, unspecified: Secondary | ICD-10-CM | POA: Diagnosis not present

## 2021-11-11 DIAGNOSIS — I129 Hypertensive chronic kidney disease with stage 1 through stage 4 chronic kidney disease, or unspecified chronic kidney disease: Secondary | ICD-10-CM | POA: Diagnosis not present

## 2021-11-11 DIAGNOSIS — I493 Ventricular premature depolarization: Secondary | ICD-10-CM | POA: Diagnosis not present

## 2021-11-11 DIAGNOSIS — G479 Sleep disorder, unspecified: Secondary | ICD-10-CM | POA: Diagnosis not present

## 2021-11-18 ENCOUNTER — Other Ambulatory Visit: Payer: Self-pay | Admitting: *Deleted

## 2021-11-18 ENCOUNTER — Ambulatory Visit (INDEPENDENT_AMBULATORY_CARE_PROVIDER_SITE_OTHER): Payer: PPO

## 2021-11-18 DIAGNOSIS — I499 Cardiac arrhythmia, unspecified: Secondary | ICD-10-CM

## 2021-11-18 DIAGNOSIS — R002 Palpitations: Secondary | ICD-10-CM

## 2021-11-18 NOTE — Progress Notes (Unsigned)
Enrolled for [employer] to mail a ZIO XT long term holter monitor to the patients address on file.  

## 2021-11-26 DIAGNOSIS — R002 Palpitations: Secondary | ICD-10-CM | POA: Diagnosis not present

## 2021-11-26 DIAGNOSIS — I499 Cardiac arrhythmia, unspecified: Secondary | ICD-10-CM | POA: Diagnosis not present

## 2021-12-07 ENCOUNTER — Encounter (HOSPITAL_BASED_OUTPATIENT_CLINIC_OR_DEPARTMENT_OTHER): Payer: Self-pay | Admitting: Emergency Medicine

## 2021-12-07 ENCOUNTER — Observation Stay (HOSPITAL_BASED_OUTPATIENT_CLINIC_OR_DEPARTMENT_OTHER)
Admission: EM | Admit: 2021-12-07 | Discharge: 2021-12-09 | Disposition: A | Discharge status: Home / Self Care | Payer: PPO | Attending: Internal Medicine | Admitting: Internal Medicine

## 2021-12-07 ENCOUNTER — Emergency Department (HOSPITAL_BASED_OUTPATIENT_CLINIC_OR_DEPARTMENT_OTHER): Payer: PPO | Admitting: Radiology

## 2021-12-07 ENCOUNTER — Other Ambulatory Visit: Payer: Self-pay

## 2021-12-07 ENCOUNTER — Encounter (HOSPITAL_COMMUNITY): Payer: Self-pay

## 2021-12-07 DIAGNOSIS — R002 Palpitations: Secondary | ICD-10-CM

## 2021-12-07 DIAGNOSIS — Z7982 Long term (current) use of aspirin: Secondary | ICD-10-CM | POA: Diagnosis not present

## 2021-12-07 DIAGNOSIS — I4891 Unspecified atrial fibrillation: Secondary | ICD-10-CM

## 2021-12-07 DIAGNOSIS — N1832 Chronic kidney disease, stage 3b: Secondary | ICD-10-CM | POA: Diagnosis not present

## 2021-12-07 DIAGNOSIS — I129 Hypertensive chronic kidney disease with stage 1 through stage 4 chronic kidney disease, or unspecified chronic kidney disease: Secondary | ICD-10-CM | POA: Diagnosis not present

## 2021-12-07 DIAGNOSIS — N179 Acute kidney failure, unspecified: Secondary | ICD-10-CM | POA: Diagnosis not present

## 2021-12-07 DIAGNOSIS — I480 Paroxysmal atrial fibrillation: Secondary | ICD-10-CM

## 2021-12-07 DIAGNOSIS — Z96643 Presence of artificial hip joint, bilateral: Secondary | ICD-10-CM | POA: Diagnosis not present

## 2021-12-07 DIAGNOSIS — I10 Essential (primary) hypertension: Secondary | ICD-10-CM | POA: Diagnosis present

## 2021-12-07 DIAGNOSIS — R778 Other specified abnormalities of plasma proteins: Principal | ICD-10-CM | POA: Insufficient documentation

## 2021-12-07 DIAGNOSIS — N1831 Chronic kidney disease, stage 3a: Secondary | ICD-10-CM | POA: Diagnosis present

## 2021-12-07 DIAGNOSIS — I493 Ventricular premature depolarization: Secondary | ICD-10-CM

## 2021-12-07 DIAGNOSIS — G2581 Restless legs syndrome: Secondary | ICD-10-CM | POA: Diagnosis present

## 2021-12-07 DIAGNOSIS — K219 Gastro-esophageal reflux disease without esophagitis: Secondary | ICD-10-CM | POA: Diagnosis present

## 2021-12-07 DIAGNOSIS — Z79899 Other long term (current) drug therapy: Secondary | ICD-10-CM | POA: Diagnosis not present

## 2021-12-07 DIAGNOSIS — R7989 Other specified abnormal findings of blood chemistry: Secondary | ICD-10-CM

## 2021-12-07 HISTORY — DX: Unspecified atrial fibrillation: I48.91

## 2021-12-07 LAB — CBC
HCT: 40.8 % (ref 36.0–46.0)
Hemoglobin: 14.2 g/dL (ref 12.0–15.0)
MCH: 31.9 pg (ref 26.0–34.0)
MCHC: 34.8 g/dL (ref 30.0–36.0)
MCV: 91.7 fL (ref 80.0–100.0)
Platelets: 149 10*3/uL — ABNORMAL LOW (ref 150–400)
RBC: 4.45 MIL/uL (ref 3.87–5.11)
RDW: 11.9 % (ref 11.5–15.5)
WBC: 6 10*3/uL (ref 4.0–10.5)
nRBC: 0 % (ref 0.0–0.2)

## 2021-12-07 LAB — PROTIME-INR
INR: 1 (ref 0.8–1.2)
Prothrombin Time: 13 seconds (ref 11.4–15.2)

## 2021-12-07 LAB — BASIC METABOLIC PANEL
Anion gap: 11 (ref 5–15)
BUN: 34 mg/dL — ABNORMAL HIGH (ref 8–23)
CO2: 26 mmol/L (ref 22–32)
Calcium: 9.6 mg/dL (ref 8.9–10.3)
Chloride: 100 mmol/L (ref 98–111)
Creatinine, Ser: 1.68 mg/dL — ABNORMAL HIGH (ref 0.44–1.00)
GFR, Estimated: 30 mL/min — ABNORMAL LOW (ref >60)
Glucose, Bld: 123 mg/dL — ABNORMAL HIGH (ref 70–99)
Potassium: 4.1 mmol/L (ref 3.5–5.1)
Sodium: 137 mmol/L (ref 135–145)

## 2021-12-07 LAB — TROPONIN I (HIGH SENSITIVITY)
Troponin I (High Sensitivity): <2 ng/L (ref <18)
Troponin I (High Sensitivity): 72 ng/L — ABNORMAL HIGH (ref <18)
Troponin I (High Sensitivity): 68 ng/L — ABNORMAL HIGH (ref <18)

## 2021-12-07 LAB — MAGNESIUM: Magnesium: 2 mg/dL (ref 1.7–2.4)

## 2021-12-07 LAB — TSH: TSH: 2.631 u[IU]/mL (ref 0.350–4.500)

## 2021-12-07 MED ORDER — LOSARTAN POTASSIUM 50 MG PO TABS
100.0000 mg | ORAL_TABLET | Freq: Every morning | ORAL | Status: DC
  Administered 2021-12-08 – 2021-12-09 (×2): 100 mg via ORAL
  Filled 2021-12-07: qty 2
  Filled 2021-12-07: qty 4

## 2021-12-07 MED ORDER — HYDRALAZINE HCL 20 MG/ML IJ SOLN
5.0000 mg | INTRAMUSCULAR | Status: DC | PRN
Start: 2021-12-07 — End: 2021-12-08

## 2021-12-07 MED ORDER — ASPIRIN 81 MG PO CHEW
324.0000 mg | CHEWABLE_TABLET | Freq: Once | ORAL | Status: AC
  Administered 2021-12-07: 324 mg via ORAL
  Filled 2021-12-07: qty 4

## 2021-12-07 MED ORDER — LACTATED RINGERS IV BOLUS
500.0000 mL | Freq: Once | INTRAVENOUS | Status: AC
  Administered 2021-12-07: 500 mL via INTRAVENOUS

## 2021-12-07 MED ORDER — METOPROLOL SUCCINATE ER 50 MG PO TB24
50.0000 mg | ORAL_TABLET | Freq: Every morning | ORAL | Status: DC
Start: 2021-12-08 — End: 2021-12-09
  Administered 2021-12-08 – 2021-12-09 (×2): 50 mg via ORAL
  Filled 2021-12-07: qty 1
  Filled 2021-12-07: qty 2

## 2021-12-07 NOTE — ED Triage Notes (Signed)
On aug 8/2 seen by MD and dx afib, wearing amonitor now. Pt does not have cards yet, this is new. She has been feeling fine, woke up at 3am with sudden palpitations, and sudden feeling of anxiousness, watch told her she had afib.no recent illness

## 2021-12-07 NOTE — Discharge Instructions (Addendum)
You were seen in the ER today for palpitations.  I never saw an episode of atrial fibrillation but you do have intermittent PVCs or premature ventricular contractions.  Overall your work-up including physical exam, chest x-ray, EKGs and labs were all reassuring.  You were slightly dehydrated and given fluids while in the ER.  I would like you to follow-up with cardiology regarding your palpitations.  I have put in the referral for cardiology.  If you have not heard from the Cardiology office within the next 72 hours please call [phone].  Come back for chest pain, shortness of breath, fainting or any other symptoms concerning to you.

## 2021-12-07 NOTE — ED Provider Notes (Addendum)
[facility] EMERGENCY DEPT Provider Note   CSN: [id number] Arrival date & time: 12/07/21  1344     History  Chief Complaint  Patient presents with   Palpitations    Nancy Baxter is a 81 y.o. female.  With PMH of HTN, anemia, GERD, PVCs who presents with intermittent palpitations.  She was seen by her PCP for palpitations on August 2 and started on a Zio patch recently which she will have taken off this Thursday.  Last night around 3 in the morning she was woken up by her heart racing and palpitations and her watch said A-fib.  The rate was around 130s.  It lasted for approximately an hour and then stopped.  She is not on any new medications and has been on the same antihypertensive since her 25s.  She denies any associated chest pain, shortness of breath, leg pain or swelling.  She does take Lasix.  She has had no recent fevers, chills or other infectious symptoms.  No vomiting, no diarrhea, no melena or hematochezia.  She has no history of MI or CVA.  She does note drinking maybe 3 glasses of water daily.  She came in today because she cannot follow-up with her PCP and wants to know what is causing these palpitations. She has no cardiologist.   Palpitations      Home Medications Prior to Admission medications   Medication Sig Start Date End Date Taking? Authorizing Provider  acetaminophen (TYLENOL) 650 MG CR tablet Take 1,300 mg by mouth daily.    [provider]  aspirin 325 MG EC tablet Take 1 tablet (325 mg total) by mouth 2 (two) times daily. Then take one 81 mg aspirin once a day for three weeks. Then discontinue aspirin. 08/07/20   Fenton Foy D, PA-C  Calcium Carb-Cholecalciferol (CALTRATE 600+D3 PO) Take 1 tablet by mouth once a week.    [provider]  Cyanocobalamin (VITAMIN B-12) 2500 MCG SUBL Take 5,000 mcg by mouth in the morning.    [provider]  ferrous sulfate 325 (65 FE) MG tablet Take 325 mg by mouth once a week.     [provider]  furosemide (LASIX) 20 MG tablet Take 40 mg by mouth in the morning. 02/25/15   [provider]  HYDROcodone-acetaminophen (NORCO/VICODIN) 5-325 MG tablet Take 1-2 tablets by mouth every 6 (six) hours as needed for severe pain. 08/07/20   Fenton Foy D, PA-C  losartan (COZAAR) 100 MG tablet Take 100 mg by mouth in the morning. 05/29/20   [provider]  methocarbamol (ROBAXIN) 500 MG tablet Take 1 tablet (500 mg total) by mouth every 6 (six) hours as needed for muscle spasms. 08/07/20   Fenton Foy D, PA-C  metoprolol (TOPROL-XL) 50 MG 24 hr tablet Take 50 mg by mouth every morning.    [provider]  Polyethyl Glycol-Propyl Glycol (SYSTANE ULTRA) 0.4-0.3 % SOLN Place 1-2 drops into both eyes 3 (three) times daily as needed (dry/irritated eyes.).    [provider]  rOPINIRole (REQUIP XL) 2 MG 24 hr tablet TAKE 1 TABLET BY MOUTH AT 6 IN THE EVENING AND 1 TABLET EVERY NIGHT AT BEDTIME 12/18/20   Dohmeier, Asencion Partridge, MD  traMADol (ULTRAM) 50 MG tablet Take 1-2 tablets (50-100 mg total) by mouth every 12 (twelve) hours as needed for moderate pain. 08/07/20   Fenton Foy D, PA-C  Zinc 50 MG TABS Take 50 mg by mouth in the morning.    [provider]      Allergies    Sulfa antibiotics, Codeine, Prednisone, Zolpidem tartrate, and Adhesive [tape]    Review of Systems   Review of Systems  Cardiovascular:  Positive for palpitations.    Physical Exam Updated Vital Signs BP (!) 168/86 (BP Location: Left Arm)   Pulse 78   Temp (!) 97.5 F (36.4 C) (Oral)   Resp (!) 21   SpO2 99%  Physical Exam Constitutional: very pleasant female. Alert and oriented. Well appearing and in no distress. Eyes: Conjunctivae are normal. ENT      Head: Normocephalic and atraumatic.      Nose: No congestion.      Mouth/Throat: Mucous membranes are moist.      Neck: No stridor. Cardiovascular: S1, S2,  Normal and symmetric distal pulses are  present in all extremities.Warm and well perfused equal palpable DP and radial pulses. Respiratory: Normal respiratory effort. Breath sounds are normal.  O2 sat 98 on RA. Gastrointestinal: Soft and nontender. Musculoskeletal: Normal range of motion in all extremities.      Right lower leg: No tenderness or edema.      Left lower leg: No tenderness or edema. Neurologic: Normal speech and language. No gross focal neurologic deficits are appreciated. Skin: Skin is warm, dry and intact. No rash noted. Psychiatric: Mood and affect are normal. Speech and behavior are normal.  ED Results / Procedures / Treatments   Labs (all labs ordered are listed, but only abnormal results are displayed) Labs Reviewed  BASIC METABOLIC PANEL - Abnormal; Notable for the following components:      Result Value   Glucose, Bld 123 (*)    BUN 34 (*)    Creatinine, Ser 1.68 (*)    GFR, Estimated 30 (*)    All other components within normal limits  CBC - Abnormal; Notable for the following components:   Platelets 149 (*)    All other components within normal limits  TROPONIN I (HIGH SENSITIVITY) - Abnormal; Notable for the following components:   Troponin I (High Sensitivity) 72 (*)    All other components within normal limits  PROTIME-INR  MAGNESIUM  TSH  TROPONIN I (HIGH SENSITIVITY)    EKG EKG Interpretation  Date/Time:  Monday December 07 2021 14:03:04 EDT Ventricular Rate:  76 PR Interval:  190 QRS Duration: 68 QT Interval:  406 QTC Calculation: 456 R Axis:   -9 Text Interpretation: Sinus rhythm with Premature supraventricular complexes Anterior infarct , age undetermined Abnormal ECG When compared with ECG of 27-Jun-2020 18:14, PREVIOUS ECG IS PRESENT Confirmed by Georgina Snell [phone]) on 12/07/2021 6:12:05 PM  Radiology DG Chest 2 View  Result Date: 12/07/2021 CLINICAL DATA:  Atrial fibrillation diagnosed 11/11/2021. Currently wearing a heart monitor. Had atrial fibrillation episode today.  EXAM: CHEST - 2 VIEW COMPARISON:  Chest two views 01/05/2012 FINDINGS: New anterior left chest wall electronic device consistent with reported heart monitor. Cardiac silhouette and mediastinal contours are within normal limits. Mild calcification within aortic arch. No pulmonary edema, pleural effusion, or pneumothorax. Moderate dextrocurvature centered at the thoracolumbar junction is similar to prior. Severe right glenohumeral joint space narrowing, subchondral sclerosis, and subchondral degenerative cysts. IMPRESSION: No acute cardiopulmonary disease process. Electronically Signed   By: Yvonne Kendall M.D.   On: 12/07/2021 14:39    Procedures Procedures  Remain on constant cardiac monitoring normal sinus rhythm with intermittent PVC  Medications Ordered in ED Medications  lactated ringers bolus 500 mL (0 mLs Intravenous Stopped 12/07/21 1957)  aspirin chewable tablet 324 mg (324 mg Oral Given 12/07/21 2016)    ED Course/ Medical Decision Making/ A&P Clinical Course as of 12/07/21 2055  Mon Dec 07, 2021  2054 Discussed with hospitalist who will put in orders for admission. He requests continued troponin orders and scheduled echo.  [VB]    Clinical Course User Index [VB] Elgie Congo, MD                           Medical Decision Making PRICELLA GAUGH is a 81 y.o. female.  With PMH of HTN, anemia, GERD, PVCs who presents with intermittent palpitations.  She was seen by her PCP for palpitations on August 2 and started on a Zio patch recently which she will have taken off this Thursday.   Patient currently on Zio patch for palpitations.  I have only witnessed intermittent PVCs on cardiac monitor and EKG otherwise normal sinus rhythm with normal PR interval and QTc with no ischemic change or symptoms suggestive of ACS.    She has a normal hemoglobin 14.2 and no complaints of bleeding.  Mild thrombocytopenia 149.  She has no infectious symptoms suggestive of sepsis.  She does have decreased  p.o. intake and is on Lasix consistent with her AKI creatinine 1.68 with BUN to creatinine ratio 20 suggestive of prerenal.  Otherwise no acute electrolyte abnormalities sodium 137 potassium 4.1 normal magnesium 2.0.  TSH normal. CXR with no pneumonia, no cardiomegaly, no pleural effusion.  I have no concern for PE at this time with no signs of DVT on exam no tachycardia, no cardiopulmonary complaints.  Patient was plan to discharge home with her initial reassuring work-up and undetected high sensitive troponin but repeat troponin was checked prior to discharge with elevated troponin 72.  She has no active chest pain and no ischemic changes on repeat EKG.  I discussed case with Joaquim Lai from cardiology who agrees holding off from heparin at this time and admitting to hospitalist with echo in the morning and continue trending of troponins.  I have ordered for full dose aspirin.   Amount and/or Complexity of Data Reviewed Labs: ordered. Decision-making details documented in ED Course. Radiology: ordered and independent interpretation performed. Decision-making details documented in ED Course.    Details: Chest x-ray with no consolidation concerning for pneumonia on independent read ECG/medicine tests: independent interpretation performed. Decision-making details documented in ED Course.  Risk OTC drugs. Decision regarding hospitalization.  No acute   Final Clinical Impression(s) / ED Diagnoses Final diagnoses:  Palpitations  PVC (premature ventricular contraction)  Elevated troponin  AKI (acute kidney injury) ([place])    Rx / DC Orders ED Discharge Orders          Ordered    Ambulatory referral to Cardiology       Comments: If you have not heard from the Cardiology office within the next 72 hours please call [phone].   12/07/21 1914              Elgie Congo, MD 12/07/21 1950    Elgie Congo, MD 12/07/21 2055

## 2021-12-07 NOTE — Progress Notes (Signed)
Plan of Care Note for accepted transfer   Patient: Nancy Baxter MRN: [id number]   DOA: 12/07/2021  Facility requesting transfer: [facility] Requesting Provider: Dr. Nechama Guard Reason for transfer: Elevated troponin, concern for paroxysmal arrhythmias  Facility course:   81 year old female with past medical history of hypertension, gastroesophageal reflux disease who is actively being worked up in the outpatient for frequent palp patient is having seen cardiology earlier in the month and being placed on a Zio patch.  Patient reports that at 3:00 this morning she woke up from sleep with palpitations and her smart watch told her she was in atrial fibrillation with heart rate in the 130s.  Patient states she was unable to get a hold of her outpatient providers and therefore came to [facility] for evaluation.  Upon evaluation in the emergency department patient has been chest pain-free with EKG revealing normal sinus rhythm with no dynamic ST segment change.  First troponin was less than 2 however second troponin rose to 72.  EDP discussed case with Dr. Vickki Muff with on-call cardiology who recommend hospitalization for serial troponin, echocardiogram in the morning and cardiology consultation .  He stated that [facility] or [facility] was fine.  Plan of care: The patient is accepted for admission to Telemetry unit, at [facility], [facility], [facility]..    Author: Vernelle Emerald, MD 12/07/2021  Check [email or web address] for on-call coverage.  Nursing staff, Please call [place] number on Amion as soon as patient's arrival, so appropriate admitting provider can evaluate the pt.

## 2021-12-08 ENCOUNTER — Encounter (HOSPITAL_COMMUNITY): Payer: Self-pay | Admitting: Internal Medicine

## 2021-12-08 DIAGNOSIS — K219 Gastro-esophageal reflux disease without esophagitis: Secondary | ICD-10-CM | POA: Diagnosis not present

## 2021-12-08 DIAGNOSIS — R002 Palpitations: Secondary | ICD-10-CM

## 2021-12-08 DIAGNOSIS — R778 Other specified abnormalities of plasma proteins: Secondary | ICD-10-CM | POA: Diagnosis not present

## 2021-12-08 DIAGNOSIS — I10 Essential (primary) hypertension: Secondary | ICD-10-CM | POA: Diagnosis not present

## 2021-12-08 DIAGNOSIS — Z79899 Other long term (current) drug therapy: Secondary | ICD-10-CM | POA: Diagnosis not present

## 2021-12-08 DIAGNOSIS — I129 Hypertensive chronic kidney disease with stage 1 through stage 4 chronic kidney disease, or unspecified chronic kidney disease: Secondary | ICD-10-CM | POA: Diagnosis not present

## 2021-12-08 DIAGNOSIS — N1832 Chronic kidney disease, stage 3b: Secondary | ICD-10-CM | POA: Diagnosis not present

## 2021-12-08 DIAGNOSIS — N1831 Chronic kidney disease, stage 3a: Secondary | ICD-10-CM | POA: Diagnosis not present

## 2021-12-08 DIAGNOSIS — I493 Ventricular premature depolarization: Secondary | ICD-10-CM | POA: Diagnosis not present

## 2021-12-08 DIAGNOSIS — G2581 Restless legs syndrome: Secondary | ICD-10-CM | POA: Diagnosis not present

## 2021-12-08 DIAGNOSIS — I4891 Unspecified atrial fibrillation: Secondary | ICD-10-CM | POA: Diagnosis not present

## 2021-12-08 DIAGNOSIS — Z96643 Presence of artificial hip joint, bilateral: Secondary | ICD-10-CM | POA: Diagnosis not present

## 2021-12-08 DIAGNOSIS — Z7982 Long term (current) use of aspirin: Secondary | ICD-10-CM | POA: Diagnosis not present

## 2021-12-08 DIAGNOSIS — N179 Acute kidney failure, unspecified: Secondary | ICD-10-CM | POA: Diagnosis not present

## 2021-12-08 LAB — TROPONIN I (HIGH SENSITIVITY)
Troponin I (High Sensitivity): 53 ng/L — ABNORMAL HIGH (ref <18)
Troponin I (High Sensitivity): 67 ng/L — ABNORMAL HIGH (ref <18)
Troponin I (High Sensitivity): 72 ng/L — ABNORMAL HIGH (ref <18)

## 2021-12-08 LAB — BASIC METABOLIC PANEL
Anion gap: 7 (ref 5–15)
BUN: 32 mg/dL — ABNORMAL HIGH (ref 8–23)
CO2: 27 mmol/L (ref 22–32)
Calcium: 9.2 mg/dL (ref 8.9–10.3)
Chloride: 103 mmol/L (ref 98–111)
Creatinine, Ser: 1.41 mg/dL — ABNORMAL HIGH (ref 0.44–1.00)
GFR, Estimated: 37 mL/min — ABNORMAL LOW (ref >60)
Glucose, Bld: 96 mg/dL (ref 70–99)
Potassium: 3.9 mmol/L (ref 3.5–5.1)
Sodium: 137 mmol/L (ref 135–145)

## 2021-12-08 LAB — CBC
HCT: 39.9 % (ref 36.0–46.0)
Hemoglobin: 13.3 g/dL (ref 12.0–15.0)
MCH: 31.8 pg (ref 26.0–34.0)
MCHC: 33.3 g/dL (ref 30.0–36.0)
MCV: 95.5 fL (ref 80.0–100.0)
Platelets: 143 10*3/uL — ABNORMAL LOW (ref 150–400)
RBC: 4.18 MIL/uL (ref 3.87–5.11)
RDW: 12 % (ref 11.5–15.5)
WBC: 5.9 10*3/uL (ref 4.0–10.5)
nRBC: 0 % (ref 0.0–0.2)

## 2021-12-08 MED ORDER — ACETAMINOPHEN 325 MG PO TABS: 650.0000 mg | ORAL_TABLET | Freq: Four times a day (QID) | ORAL | Status: DC | PRN

## 2021-12-08 MED ORDER — POLYETHYLENE GLYCOL 3350 17 G PO PACK: 17.0000 g | PACK | Freq: Every day | ORAL | Status: DC | PRN

## 2021-12-08 MED ORDER — SODIUM CHLORIDE 0.9% FLUSH
3.0000 mL | Freq: Two times a day (BID) | INTRAVENOUS | Status: DC
  Administered 2021-12-08 – 2021-12-09 (×2): 3 mL via INTRAVENOUS

## 2021-12-08 MED ORDER — ACETAMINOPHEN 650 MG RE SUPP: 650.0000 mg | Freq: Four times a day (QID) | RECTAL | Status: DC | PRN

## 2021-12-08 MED ORDER — ENOXAPARIN SODIUM 30 MG/0.3ML IJ SOSY
30.0000 mg | PREFILLED_SYRINGE | INTRAMUSCULAR | Status: DC
  Administered 2021-12-08: 30 mg via SUBCUTANEOUS
  Filled 2021-12-08: qty 0.3

## 2021-12-08 MED ORDER — ROPINIROLE HCL 1 MG PO TABS
2.0000 mg | ORAL_TABLET | Freq: Every day | ORAL | Status: DC
  Administered 2021-12-08: 2 mg via ORAL
  Filled 2021-12-08 (×2): qty 2

## 2021-12-08 NOTE — H&P (Addendum)
History and Physical   Nancy Baxter FUX:[id number] DOB: Sep 12, 1940 DOA: 12/07/2021  PCP: Shon Baton, MD   Patient coming from: Home  Chief Complaint: Palpitations  HPI: Nancy Baxter is a 81 y.o. female with medical history significant of restless leg syndrome, hypertension, BPPV, CKD 3A, GERD, venous insufficiency, PVCs presenting with ongoing palpitations.  Patient has had recent history of intermittent palpitations she was seen by PCP in early August after initial episodes of palpitations and subsequently had a Zio patch ordered and placed.  This patch was due to come off on Thursday.  Yesterday around 3 AM she woke up with significant palpitations and her watch notified her that she was in atrial fibrillation at 130 bpm.  She states this lasted for around an hour and then stopped on its own.  She had no associated chest pain or shortness of breath.  She states she has been unable to reach for her outpatient providers and so she presented to the ED for further evaluation.  She denies fevers, chills, abdominal pain, constipation, diarrhea, nausea, vomiting.  ED Course: Vital signs in the ED significant for blood pressure in the 025K to 270W systolic, respiratory rate in the teens to 20s.  Lab work-up included BMP with BUN of 34 and creatinine mildly elevated to 1.68 from baseline 1.4, glucose 123.  CBC with platelets of 149.  PT and INR normal.  TSH normal.  Troponin trend less than -2, 72, 68, 72, 67, 53.  Magnesium normal.  Chest x-ray without acute abnormalities.  Patient received aspirin, hydralazine, losartan, metoprolol, ropinirole and 500 cc bolus in the ED.  Cardiologist consulted and recommended observation on telemetry and echocardiogram with plan for consult on arrival to hospital.  Review of Systems: As per HPI otherwise all other systems reviewed and are negative.  Past Medical History:  Diagnosis Date   A-fib ([place])    Anemia    pt. denies   Arthritis     Complication of anesthesia    GERD (gastroesophageal reflux disease)    Heart murmur    evaluated 59yrago-no cardiac follow up needed   Hypertension    PONV (postoperative nausea and vomiting)    Postop Acute blood loss anemia 01/06/2012   Postop Hyponatremia 01/06/2012   Postop Transfusion 01/07/2012   Restless leg     Past Surgical History:  Procedure Laterality Date   BACK SURGERY  1961   spinal fusion age    C53TUNNEL RELEASE Left 05/18/2016   Procedure: LEFT CARPAL TUNNEL RELEASE;  Surgeon: GDaryll Brod MD;  Location: [facility]  Service: Orthopedics;  Laterality: Left;   EAR CYST EXCISION  02/23/2011   pt. denies   FOOT ARTHRODESIS  5/12   hammer toes,bunionectomy   JOINT REPLACEMENT  2006   rt hip   JOINT REPLACEMENT  2008   lt hip   TONSILLECTOMY     age 81  TOTAL HIP REVISION  01/05/2012   Procedure: TOTAL HIP REVISION;  Surgeon: FGearlean Alf MD;  Location: [facility];  Service: Orthopedics;  Laterality: Left;   TOTAL HIP REVISION Left 08/06/2020   Procedure: Left hip bearing surface revision;  Surgeon: AGaynelle Arabian MD;  Location: [facility];  Service: Orthopedics;  Laterality: Left;  955m   TRIGGER FINGER RELEASE  02/23/2011   Procedure: RELEASE TRIGGER FINGER/A-1 PULLEY;  Surgeon: GaWynonia SoursMD;  [facility] Service: Orthopedics;  Laterality: Right;   TRIGGER FINGER RELEASE Left  07/26/2012   Procedure: RELEASE TRIGGER FINGER/A-1 PULLEY LEFT SMALL FINGER;  Surgeon: Wynonia Sours, MD;  Location: [facility];  Service: Orthopedics;  Laterality: Left;   TRIGGER FINGER RELEASE Left 05/18/2016   Procedure: RELEASE TRIGGER FINGER/A-1 PULLEY LEFT INDEX;  Surgeon: Daryll Brod, MD;  Location: [facility];  Service: Orthopedics;  Laterality: Left;   VAGINAL HYSTERECTOMY      Social History  reports that she has never smoked. She has never used smokeless tobacco. She reports current alcohol use of about 1.0  standard drink of alcohol per week. She reports that she does not use drugs.  Allergies  Allergen Reactions   Sulfa Antibiotics Hives   Codeine Nausea And Vomiting   Prednisone Other (See Comments)    Shuts down adrenals   Zolpidem Tartrate Other (See Comments)    sleepwalks   Adhesive [Tape] Rash    blisters    Family History  Problem Relation Age of Onset   Colon cancer Neg Hx    Esophageal cancer Neg Hx    Rectal cancer Neg Hx    Stomach cancer Neg Hx   Reviewed on admission  Prior to Admission medications   Medication Sig Start Date End Date Taking? Authorizing Provider  furosemide (LASIX) 20 MG tablet Take 40 mg by mouth in the morning. 02/25/15  Yes [provider]  losartan (COZAAR) 100 MG tablet Take 100 mg by mouth in the morning. 05/29/20  Yes [provider]  metoprolol (TOPROL-XL) 50 MG 24 hr tablet Take 50 mg by mouth every morning.   Yes [provider]  rOPINIRole (REQUIP XL) 2 MG 24 hr tablet TAKE 1 TABLET BY MOUTH AT 6 IN THE EVENING AND 1 TABLET EVERY NIGHT AT BEDTIME 12/18/20  Yes Dohmeier, Asencion Partridge, MD  aspirin 325 MG EC tablet Take 1 tablet (325 mg total) by mouth 2 (two) times daily. Then take one 81 mg aspirin once a day for three weeks. Then discontinue aspirin. 08/07/20   Jonnie Kind, PA-C    Physical Exam: Vitals:   12/08/21 1617 12/08/21 1730 12/08/21 1749 12/08/21 1800  BP:  (!) 190/98  (!) 177/91  Pulse:  67  69  Resp:      Temp: 97.8 F (36.6 C)     TempSrc: Oral     SpO2:  100%    Weight:   54.2 kg   Height:    '4\' 11"'$  (1.499 m)    Physical Exam Constitutional:      General: She is not in acute distress.    Appearance: Normal appearance.  HENT:     Head: Normocephalic and atraumatic.     Mouth/Throat:     Mouth: Mucous membranes are moist.     Pharynx: Oropharynx is clear.  Eyes:     Extraocular Movements: Extraocular movements intact.     Pupils: Pupils are equal, round, and reactive to light.   Cardiovascular:     Rate and Rhythm: Normal rate and regular rhythm.     Pulses: Normal pulses.     Heart sounds: Murmur heard.  Pulmonary:     Effort: Pulmonary effort is normal. No respiratory distress.     Breath sounds: Normal breath sounds.  Abdominal:     General: Bowel sounds are normal. There is no distension.     Palpations: Abdomen is soft.     Tenderness: There is no abdominal tenderness.  Musculoskeletal:        General: No swelling  or deformity.  Skin:    General: Skin is warm and dry.  Neurological:     General: No focal deficit present.     Mental Status: Mental status is at baseline.    Labs on Admission: I have personally reviewed following labs and imaging studies  CBC: Recent Labs  Lab 12/07/21 1414 12/08/21 1837  WBC 6.0 5.9  HGB 14.2 13.3  HCT 40.8 39.9  MCV 91.7 95.5  PLT 149* 143*    Basic Metabolic Panel: Recent Labs  Lab 12/07/21 1414 12/07/21 1837 12/08/21 1837  NA 137  --  137  K 4.1  --  3.9  CL 100  --  103  CO2 26  --  27  GLUCOSE 123*  --  96  BUN 34*  --  32*  CREATININE 1.68*  --  1.41*  CALCIUM 9.6  --  9.2  MG  --  2.0  --     GFR: Estimated Creatinine Clearance: 23.5 mL/min (A) (by C-G formula based on SCr of 1.41 mg/dL (H)).  Liver Function Tests: No results for input(s): "AST", "ALT", "ALKPHOS", "BILITOT", "PROT", "ALBUMIN" in the last 168 hours.  Urine analysis:    Component Value Date/Time   COLORURINE YELLOW 01/05/2012 [place] 01/05/2012 0525   LABSPEC 1.020 01/05/2012 0525   PHURINE 6.0 01/05/2012 0525   GLUCOSEU NEGATIVE 01/05/2012 0525   HGBUR NEGATIVE 01/05/2012 0525   BILIRUBINUR NEGATIVE 01/05/2012 0525   KETONESUR NEGATIVE 01/05/2012 0525   PROTEINUR NEGATIVE 01/05/2012 0525   UROBILINOGEN 0.2 01/05/2012 0525   NITRITE NEGATIVE 01/05/2012 0525   LEUKOCYTESUR LARGE (A) 01/05/2012 0525    Radiological Exams on Admission: DG Chest 2 View  Result Date: 12/07/2021 CLINICAL DATA:   Atrial fibrillation diagnosed 11/11/2021. Currently wearing a heart monitor. Had atrial fibrillation episode today. EXAM: CHEST - 2 VIEW COMPARISON:  Chest two views 01/05/2012 FINDINGS: New anterior left chest wall electronic device consistent with reported heart monitor. Cardiac silhouette and mediastinal contours are within normal limits. Mild calcification within aortic arch. No pulmonary edema, pleural effusion, or pneumothorax. Moderate dextrocurvature centered at the thoracolumbar junction is similar to prior. Severe right glenohumeral joint space narrowing, subchondral sclerosis, and subchondral degenerative cysts. IMPRESSION: No acute cardiopulmonary disease process. Electronically Signed   By: Yvonne Kendall M.D.   On: 12/07/2021 14:39    EKG: Independently reviewed.  Sinus rhythm at 76 bpm.  PACs noted.  Assessment/Plan Principal Problem:   Elevated troponin Active Problems:   Restless legs syndrome (RLS)   Primary hypertension   Chronic kidney disease, stage 3a (HCC)   Gastro-esophageal reflux disease without esophagitis   Palpitations   Troponin elevation Palpitations > Patient presenting with ongoing work-up for intermittent palpitations with episode of palpitations prior to arrival lasting an hour with watch notifying her of A-fib rhythm in the 130s. > While in the ED initially troponin was less than 2 and then repeat was elevated to 72.  Since then trend has been 73, 72, 48, 39. > Cardiology was consulted and recommended observation on telemetry and echocardiogram.  Hopefully will be able to have her Zio patch interpreted here to confirm possible A-fib, and any need for additional medications. > Received full dose aspirin in the ED. - Monitor on telemetry - Echocardiogram - Based on downtrend, no further troponins - We will need cardiology consult in the morning  Hypertension - Continue home losartan, metoprolol - Could add as needed IV labetalol if needed  CKD 3A >  Creatinine at med center borderline at 1.68 from baseline around 1.4.  Did receive 500 cc bolus they are we will repeat labs here to see if closer to baseline now. - Check BMP - Trend renal function and electrolytes - Avoid nephrotoxic agents when able  RLS - Continue home ropinirole  DVT prophylaxis: Lovenox Code Status:   Full Family Communication:  None on admission. Disposition Plan:   Patient is from:  Home  Anticipated DC to:  Home  Anticipated DC date:  1 to 2 days  Anticipated DC barriers: None  Consults called:  Cardiology consulted by ED, they may need to be reconsulted to see the patient as I do not see her on their list. Admission status:  Observation, telemetry  Severity of Illness: The appropriate patient status for this patient is OBSERVATION. Observation status is judged to be reasonable and necessary in order to provide the required intensity of service to ensure the patient's safety. The patient's presenting symptoms, physical exam findings, and initial radiographic and laboratory data in the context of their medical condition is felt to place them at decreased risk for further clinical deterioration. Furthermore, it is anticipated that the patient will be medically stable for discharge from the hospital within 2 midnights of admission.    Marcelyn Bruins MD [facility]  How to contact the [facility] Attending or Consulting provider [place] or covering provider during after hours [place], for this patient?   Check the care team in [facility] and look for a) attending/consulting [facility] provider listed and b) the [facility] team listed Log into [email or web address] and use [place]'s universal password to access. If you do not have the password, please contact the hospital operator. Locate the [facility] provider you are looking for under [facility] and page to a number that you can be directly reached. If you still have difficulty reaching the provider, please page the [facility] (Director on  Call) for the Hospitalists listed on amion for assistance.  12/08/2021, 8:16 PM

## 2021-12-08 NOTE — ED Notes (Signed)
Report called to Chilton Greathouse RN

## 2021-12-08 NOTE — ED Notes (Signed)
Patient off the floor at this time with [facility] for transfer

## 2021-12-09 ENCOUNTER — Observation Stay (HOSPITAL_BASED_OUTPATIENT_CLINIC_OR_DEPARTMENT_OTHER): Payer: PPO

## 2021-12-09 ENCOUNTER — Telehealth (HOSPITAL_COMMUNITY): Payer: Self-pay | Admitting: Pharmacy Technician

## 2021-12-09 ENCOUNTER — Other Ambulatory Visit (HOSPITAL_COMMUNITY): Payer: PPO

## 2021-12-09 ENCOUNTER — Other Ambulatory Visit (HOSPITAL_COMMUNITY): Payer: Self-pay

## 2021-12-09 DIAGNOSIS — I480 Paroxysmal atrial fibrillation: Secondary | ICD-10-CM

## 2021-12-09 DIAGNOSIS — I4891 Unspecified atrial fibrillation: Secondary | ICD-10-CM | POA: Diagnosis not present

## 2021-12-09 DIAGNOSIS — R778 Other specified abnormalities of plasma proteins: Secondary | ICD-10-CM | POA: Diagnosis not present

## 2021-12-09 LAB — CBC
HCT: 38 % (ref 36.0–46.0)
Hemoglobin: 12.9 g/dL (ref 12.0–15.0)
MCH: 32.4 pg (ref 26.0–34.0)
MCHC: 33.9 g/dL (ref 30.0–36.0)
MCV: 95.5 fL (ref 80.0–100.0)
Platelets: 134 10*3/uL — ABNORMAL LOW (ref 150–400)
RBC: 3.98 MIL/uL (ref 3.87–5.11)
RDW: 12 % (ref 11.5–15.5)
WBC: 6 10*3/uL (ref 4.0–10.5)
nRBC: 0 % (ref 0.0–0.2)

## 2021-12-09 LAB — BASIC METABOLIC PANEL
Anion gap: 5 (ref 5–15)
BUN: 35 mg/dL — ABNORMAL HIGH (ref 8–23)
CO2: 25 mmol/L (ref 22–32)
Calcium: 9.1 mg/dL (ref 8.9–10.3)
Chloride: 106 mmol/L (ref 98–111)
Creatinine, Ser: 1.65 mg/dL — ABNORMAL HIGH (ref 0.44–1.00)
GFR, Estimated: 31 mL/min — ABNORMAL LOW (ref >60)
Glucose, Bld: 90 mg/dL (ref 70–99)
Potassium: 4 mmol/L (ref 3.5–5.1)
Sodium: 136 mmol/L (ref 135–145)

## 2021-12-09 LAB — ECHOCARDIOGRAM COMPLETE
Area-P 1/2: 3.21 cm2
Height: 59 in
P 1/2 time: 522 msec
S' Lateral: 2.3 cm
Weight: 1912 oz

## 2021-12-09 MED ORDER — APIXABAN 2.5 MG PO TABS: 2.5000 mg | ORAL_TABLET | Freq: Two times a day (BID) | ORAL | 0 refills | Status: DC

## 2021-12-09 MED ORDER — APIXABAN 2.5 MG PO TABS
2.5000 mg | ORAL_TABLET | Freq: Two times a day (BID) | ORAL | Status: DC
  Administered 2021-12-09: 2.5 mg via ORAL
  Filled 2021-12-09: qty 1

## 2021-12-09 MED ORDER — METOPROLOL SUCCINATE ER 25 MG PO TB24: 75.0000 mg | ORAL_TABLET | Freq: Every morning | ORAL | 0 refills | Status: DC

## 2021-12-09 MED ORDER — METOPROLOL SUCCINATE ER 50 MG PO TB24: 75.0000 mg | ORAL_TABLET | Freq: Every morning | ORAL | Status: DC

## 2021-12-09 MED ORDER — METOPROLOL SUCCINATE ER 25 MG PO TB24
25.0000 mg | ORAL_TABLET | Freq: Once | ORAL | Status: AC
  Administered 2021-12-09: 25 mg via ORAL
  Filled 2021-12-09: qty 1

## 2021-12-09 NOTE — Discharge Summary (Signed)
Physician Discharge Summary  Nancy Baxter IHK:[id number] DOB: 09/23/1940 DOA: 12/07/2021  PCP: Shon Baton, MD  Admit date: 12/07/2021 Discharge date: 12/09/2021 Discharging to: Home Recommendations for Outpatient Follow-up:  Follow-up on BP due to increasing Toprol  Consults:  Cardiology    Discharge Diagnoses:   Principal Problem:   A-fib [facility]) Active Problems:   Restless legs syndrome (RLS)   Primary hypertension   Elevated troponin   Chronic kidney disease, stage 3a (HCC)   Gastro-esophageal reflux disease without esophagitis     Hospital Course:  This is an 81 y/o female with CKD 3, GERD and h/o palpitations. She received a Zio patch recently for 2 episode of palpitations earlier in August.  She felt palpitations on the day that she was on palpitations again which lasted for about an hour.  She stated that her Apple Watch noted that she was in A-fib.  EKG showed sinus rhythm in the ED.  Principal Problem:   A-fib ([place]) -She has a recording on her phone from her [place] which appears to be consistent with atrial fibrillation - Cardiology has evaluated the patient and feel that she has new onset atrial fibrillation.  They had increased her Toprol dose to 50 to 75 mg daily - Based on dosing requirements, she should receive 2.5 mg of Eliquis twice daily - She will also need to return her Zio patch to get a result.  Active Problems:     Elevated troponin -Mild troponin elevation was likely secondary to above A-fib with RVR    Chronic kidney disease, stage 3b -GFR currently is in the 30s              Discharge Instructions  Discharge Instructions     Ambulatory referral to Cardiology   Complete by: As directed    If you have not heard from the Cardiology office within the next 72 hours please call [phone].   Increase activity slowly   Complete by: As directed       Allergies as of 12/09/2021       Reactions   Sulfa Antibiotics Hives   Codeine  Nausea And Vomiting   Prednisone Other (See Comments)   Shuts down adrenals   Zolpidem Tartrate Other (See Comments)   sleepwalks   Adhesive [tape] Rash   blisters        Medication List     STOP taking these medications    aspirin EC 81 MG tablet   ibuprofen 200 MG tablet Commonly known as: ADVIL       TAKE these medications    acetaminophen 650 MG CR tablet Commonly known as: TYLENOL Take 1,300 mg by mouth 2 (two) times daily.   apixaban 2.5 MG Tabs tablet Commonly known as: ELIQUIS Take 1 tablet (2.5 mg total) by mouth 2 (two) times daily.   cholecalciferol 25 MCG (1000 UNIT) tablet Commonly known as: VITAMIN D3 Take 1,000 Units by mouth daily.   cyanocobalamin 1000 MCG tablet Commonly known as: VITAMIN B12 Take 2,000 mcg by mouth daily.   furosemide 20 MG tablet Commonly known as: LASIX Take 40 mg by mouth in the morning.   losartan 50 MG tablet Commonly known as: COZAAR Take 50 mg by mouth in the morning.   metoprolol succinate 25 MG 24 hr tablet Commonly known as: TOPROL-XL Take 3 tablets (75 mg total) by mouth every morning. Take with or immediately following a meal. Start taking on: December 10, 2021 What changed:  medication strength how  much to take additional instructions   rOPINIRole 2 MG 24 hr tablet Commonly known as: REQUIP XL TAKE 1 TABLET BY MOUTH AT 6 IN THE EVENING AND 1 TABLET EVERY NIGHT AT BEDTIME What changed:  how much to take when to take this additional instructions   zinc gluconate 50 MG tablet Take 50 mg by mouth daily.            The results of significant diagnostics from this hospitalization (including imaging, microbiology, ancillary and laboratory) are listed below for reference.    ECHOCARDIOGRAM COMPLETE  Result Date: 12/09/2021    ECHOCARDIOGRAM REPORT   Patient Name:   Nancy Baxter Date of Exam: 12/09/2021 Medical Rec #:  [id number]       Height:       59.0 in Accession #:    [id number]      Weight:        119.5 lb Date of Birth:  [birth year]      BSA:          1.482 m Patient Age:    75 years        BP:           152/74 mmHg Patient Gender: F               HR:           65 bpm. Exam Location:  Inpatient Procedure: 2D Echo, Cardiac Doppler and Color Doppler Indications:    Atrial Fibrillation I48.91  History:        Patient has no prior history of Echocardiogram examinations.                 Risk Factors:Hypertension. GERD.  Sonographer:    Darlina Sicilian RDCS Referring Phys: [place]  1. Left ventricular ejection fraction, by estimation, is 60 to 65%. The left ventricle has normal function. The left ventricle has no regional wall motion abnormalities. Left ventricular diastolic parameters were normal.  2. Right ventricular systolic function is normal. The right ventricular size is normal. Tricuspid regurgitation signal is inadequate for assessing PA pressure.  3. Left atrial size was moderately dilated.  4. The mitral valve is normal in structure. Mild to moderate mitral valve regurgitation.  5. The aortic valve is tricuspid. There is mild calcification of the aortic valve. There is mild thickening of the aortic valve. Aortic valve regurgitation is moderate. Aortic valve sclerosis is present, with no evidence of aortic valve stenosis.  6. There is borderline dilatation of the ascending aorta, measuring 37 mm.  7. The inferior vena cava is dilated in size with >50% respiratory variability, suggesting right atrial pressure of 8 mmHg. FINDINGS  Left Ventricle: Left ventricular ejection fraction, by estimation, is 60 to 65%. The left ventricle has normal function. The left ventricle has no regional wall motion abnormalities. The left ventricular internal cavity size was normal in size. There is  no left ventricular hypertrophy. Left ventricular diastolic parameters were normal. Right Ventricle: The right ventricular size is normal. No increase in right ventricular wall thickness. Right ventricular  systolic function is normal. Tricuspid regurgitation signal is inadequate for assessing PA pressure. Left Atrium: Left atrial size was moderately dilated. Right Atrium: Right atrial size was normal in size. Pericardium: There is no evidence of pericardial effusion. Mitral Valve: The mitral valve is normal in structure. Mild mitral annular calcification. Mild to moderate mitral valve regurgitation, with centrally-directed jet. Tricuspid Valve: The tricuspid valve is normal in structure. Tricuspid  valve regurgitation is not demonstrated. Aortic Valve: The aortic valve is tricuspid. There is mild calcification of the aortic valve. There is mild thickening of the aortic valve. Aortic valve regurgitation is moderate. Aortic regurgitation PHT measures 522 msec. Aortic valve sclerosis is present, with no evidence of aortic valve stenosis. Pulmonic Valve: The pulmonic valve was grossly normal. Pulmonic valve regurgitation is not visualized. Aorta: The aortic root is normal in size and structure. There is borderline dilatation of the ascending aorta, measuring 37 mm. Venous: The inferior vena cava is dilated in size with greater than 50% respiratory variability, suggesting right atrial pressure of 8 mmHg. IAS/Shunts: No atrial level shunt detected by color flow Doppler.  LEFT VENTRICLE PLAX 2D LVIDd:         4.00 cm   Diastology LVIDs:         2.30 cm   LV e' medial:    5.70 cm/s LV PW:         1.00 cm   LV E/e' medial:  13.7 LV IVS:        1.10 cm   LV e' lateral:   7.13 cm/s LVOT diam:     1.80 cm   LV E/e' lateral: 10.9 LV SV:         53 LV SV Index:   36 LVOT Area:     2.54 cm  RIGHT VENTRICLE RV S prime:     11.30 cm/s TAPSE (M-mode): 2.4 cm LEFT ATRIUM             Index        RIGHT ATRIUM          Index LA diam:        3.90 cm 2.63 cm/m   RA Area:     9.99 cm LA Vol (A2C):   57.0 ml 38.47 ml/m  RA Volume:   18.60 ml 12.55 ml/m LA Vol (A4C):   44.8 ml 30.23 ml/m LA Biplane Vol: 50.6 ml 34.15 ml/m  AORTIC VALVE  LVOT Vmax:   97.90 cm/s LVOT Vmean:  62.200 cm/s LVOT VTI:    0.210 m AI PHT:      522 msec  AORTA Ao Root diam: 3.10 cm Ao Asc diam:  3.70 cm MITRAL VALVE MV Area (PHT): 3.21 cm    SHUNTS MV Decel Time: 236 msec    Systemic VTI:  0.21 m MV E velocity: 78.00 cm/s  Systemic Diam: 1.80 cm MV A velocity: 86.50 cm/s MV E/A ratio:  0.90 Mihai Croitoru MD Electronically signed by Sanda Klein MD Signature Date/Time: 12/09/2021/1:23:37 PM    Final    DG Chest 2 View  Result Date: 12/07/2021 CLINICAL DATA:  Atrial fibrillation diagnosed 11/11/2021. Currently wearing a heart monitor. Had atrial fibrillation episode today. EXAM: CHEST - 2 VIEW COMPARISON:  Chest two views 01/05/2012 FINDINGS: New anterior left chest wall electronic device consistent with reported heart monitor. Cardiac silhouette and mediastinal contours are within normal limits. Mild calcification within aortic arch. No pulmonary edema, pleural effusion, or pneumothorax. Moderate dextrocurvature centered at the thoracolumbar junction is similar to prior. Severe right glenohumeral joint space narrowing, subchondral sclerosis, and subchondral degenerative cysts. IMPRESSION: No acute cardiopulmonary disease process. Electronically Signed   By: Yvonne Kendall M.D.   On: 12/07/2021 14:39   Labs:   Basic Metabolic Panel: Recent Labs  Lab 12/07/21 1414 12/07/21 1837 12/08/21 1837 12/09/21 0503  NA 137  --  137 136  K 4.1  --  3.9 4.0  CL 100  --  103 106  CO2 26  --  27 25  GLUCOSE 123*  --  96 90  BUN 34*  --  32* 35*  CREATININE 1.68*  --  1.41* 1.65*  CALCIUM 9.6  --  9.2 9.1  MG  --  2.0  --   --      CBC: Recent Labs  Lab 12/07/21 1414 12/08/21 1837 12/09/21 0503  WBC 6.0 5.9 6.0  HGB 14.2 13.3 12.9  HCT 40.8 39.9 38.0  MCV 91.7 95.5 95.5  PLT 149* 143* 134*         SIGNED:   Debbe Odea, MD  [facility] 12/09/2021, 5:52 PM

## 2021-12-09 NOTE — Progress Notes (Signed)
  Echocardiogram 2D Echocardiogram has been performed.  Nancy Baxter M 12/09/2021, 1:14 PM

## 2021-12-09 NOTE — TOC Initial Note (Signed)
Transition of Care [facility]) - Initial/Assessment Note    Patient Details  Name: Nancy Baxter MRN: [id number] Date of Birth: 1940/11/04  Transition of Care [facility]) CM/SW Contact:    Leeroy Cha, RN Phone Number: 12/09/2021, 7:12 AM  Clinical Narrative:                  Transition of Care [facility]) Screening Note   Patient Details  Name: Nancy Baxter Date of Birth: January 16, 1941   [facility]) CM/SW Contact:    Leeroy Cha, RN Phone Number: 12/09/2021, 7:12 AM    Transition of Care Department [facility]) has reviewed patient and no TOC needs have been identified at this time. We will continue to monitor patient advancement through interdisciplinary progression rounds. If new patient transition needs arise, please place a TOC consult.    Expected Discharge Plan: Home/Self Care Barriers to Discharge: Continued Medical Work up   Patient Goals and CMS Choice Patient states their goals for this hospitalization and ongoing recovery are:: to go home CMS Medicare.gov Compare Post Acute Care list provided to:: Patient Choice offered to / list presented to : Patient  Expected Discharge Plan and Services Expected Discharge Plan: Home/Self Care   Discharge Planning Services: CM Consult   Living arrangements for the past 2 months: Single Family Home                                      Prior Living Arrangements/Services Living arrangements for the past 2 months: Single Family Home Lives with:: Self Patient language and need for interpreter reviewed:: Yes              Criminal Activity/Legal Involvement Pertinent to Current Situation/Hospitalization: No - Comment as needed  Activities of Daily Living Home Assistive Devices/Equipment: None ADL Screening (condition at time of admission) Patient's cognitive ability adequate to safely complete daily activities?: Yes Is the patient deaf or have difficulty hearing?: No Does the patient have difficulty  seeing, even when wearing glasses/contacts?: No Does the patient have difficulty concentrating, remembering, or making decisions?: No Patient able to express need for assistance with ADLs?: Yes Does the patient have difficulty dressing or bathing?: No Independently performs ADLs?: Yes (appropriate for developmental age) Does the patient have difficulty walking or climbing stairs?: No Weakness of Legs: None Weakness of Arms/Hands: None  Permission Sought/Granted                  Emotional Assessment Appearance:: Appears stated age Attitude/Demeanor/Rapport: Engaged Affect (typically observed): Calm Orientation: : Oriented to Self, Oriented to Place, Oriented to  Time, Oriented to Situation Alcohol / Substance Use: Alcohol Use (occasional) Psych Involvement: No (comment)  Admission diagnosis:  Palpitations [R00.2] PVC (premature ventricular contraction) [I49.3] Elevated troponin [R77.8] AKI (acute kidney injury) ([place]) [N17.9] Patient Active Problem List   Diagnosis Date Noted   PVCs (premature ventricular contractions) 12/08/2021   Palpitations 12/08/2021   Elevated troponin 12/07/2021   Recurrent dislocation of left hip 08/06/2020   Primary hypertension 04/30/2020   Chronic kidney disease, stage 3a ([place]) 05/28/2019   Peripheral venous insufficiency 10/28/2014   Restless legs syndrome (RLS) 12/25/2012   Lumbosacral root lesions, not elsewhere classified 12/25/2012   Failed total hip arthroplasty ([place]) 01/05/2012   Gastro-esophageal reflux disease without esophagitis 07/08/2009   Benign paroxysmal positional vertigo 04/15/2009   PCP:  Shon Baton, MD Pharmacy:   [facility]  [place], [place] - [address] [place] [address] Phone: [phone] Fax: [place] [address] [place] - [place] [place] [address] Phone: [phone] Fax: [phone]     Social  Determinants of Health (SDOH) Interventions    Readmission Risk Interventions   No data to display

## 2021-12-09 NOTE — Telephone Encounter (Signed)
Pharmacy Patient Advocate Encounter  Insurance verification completed.    The patient is insured through [employer] Medicare Part D   The patient is currently admitted and ran test claims for the following: Eliquis.  Copays and coinsurance results were relayed to Inpatient clinical team.      

## 2021-12-09 NOTE — TOC Benefit Eligibility Note (Signed)
Patient Advocate Encounter  Insurance verification completed.    The patient is currently admitted and upon discharge could be taking Eliquis 2.5 mg.  The current 30 day co-pay is $45.00.   The patient is insured through [employer] Medicare Part D    Emani Morad, CPhT Pharmacy Patient Advocate Specialist [facility] Patient Advocate Team Direct Number: [phone]  Fax: [phone]        

## 2021-12-09 NOTE — Consult Note (Addendum)
Cardiology Consultation   Patient ID: Nancy Baxter MRN: [id number]; DOB: March 09, 1941  Admit date: 12/07/2021 Date of Consult: 12/09/2021  PCP:  Shon Baton, MD   [facility] Prov[facility] Cardiologist:  New to Dr. Harriet Masson    Patient Profile:   Nancy Baxter is a 81 y.o. female with a hx of hypertension, chronic kidney disease stage III, venous insufficiency, PVC and GERD who is being seen 12/09/2021 for the evaluation of palpitations/possible atrial fibrillation at the request of Dr. Wynelle Cleveland.  History of Present Illness:   Nancy Baxter was in usual state of health when woke up from sleep at 3 AM on 8/2.  She was having palpitation but no chest pain or dyspnea.  Her Apple Watch read rhythm as atrial fibrillation.  Heart rate was in 160s.  Had a recurrent episode same day.  She was seen by PCP and placed on Zio monitor.  However, had a recurrent episode early morning on 8/28.  Came to [facility] ER and transferred to [facility] for admission.  No episode of palpitation while admitted.  Patient without prior history of cardiac issue.  Has longstanding history of murmur.  She drinks 1 glass of wine 3-4 nights per week.  She had extra wine on 8/27.  She volunteers at different organization.  No chest pain, shortness of breath, dizziness, orthopnea, PND, syncope, lower extremity edema or melena.  No family history of CAD or atrial fibrillation.  High-sensitivity troponin less than 2>>72>>68>>72>>67>>53 TSH normal Past Medical History:  Diagnosis Date   A-fib ([place])    Anemia    pt. denies   Arthritis    Complication of anesthesia    GERD (gastroesophageal reflux disease)    Heart murmur    evaluated 40yrago-no cardiac follow up needed   Hypertension    PONV (postoperative nausea and vomiting)    Postop Acute blood loss anemia 01/06/2012   Postop Hyponatremia 01/06/2012   Postop Transfusion 01/07/2012   Restless leg     Past Surgical History:  Procedure Laterality Date   BACK  SURGERY  1961   spinal fusion age    C1TUNNEL RELEASE Left 05/18/2016   Procedure: LEFT CARPAL TUNNEL RELEASE;  Surgeon: GDaryll Brod MD;  Location[facility]  Service: Orthopedics;  Laterality: Left;   EAR CYST EXCISION  02/23/2011   pt. denies   FOOT ARTHRODESIS  5/12   hammer toes,bunionectomy   JOINT REPLACEMENT  2006   rt hip   JOINT REPLACEMENT  2008   lt hip   TONSILLECTOMY     age 81  TOTAL HIP REVISION  01/05/2012   Procedure: TOTAL HIP REVISION;  Surgeon: FGearlean Alf MD;  Location: [facility];  Service: Orthopedics;  Laterality: Left;   TOTAL HIP REVISION Left 08/06/2020   Procedure: Left hip bearing surface revision;  Surgeon: AGaynelle Arabian MD;  Location: [facility];  Service: Orthopedics;  Laterality: Left;  950m   TRIGGER FINGER RELEASE  02/23/2011   Procedure: RELEASE TRIGGER FINGER/A-1 PULLEY;  Surgeon: GaWynonia SoursMD;  [facility] Service: Orthopedics;  Laterality: Right;   TRIGGER FINGER RELEASE Left 07/26/2012   Procedure: RELEASE TRIGGER FINGER/A-1 PULLEY LEFT SMALL FINGER;  Surgeon: GaWynonia SoursMD;  [facility] Service: Orthopedics;  Laterality: Left;   TRIGGER FINGER RELEASE Left 05/18/2016   Procedure: RELEASE TRIGGER FINGER/A-1 PULLEY LEFT INDEX;  Surgeon: GaDaryll BrodMD;  Location: [facility];  Service: Orthopedics;  Laterality: Left;   VAGINAL HYSTERECTOMY      Inpatient Medications: Scheduled Meds:  enoxaparin (LOVENOX) injection  30 mg Subcutaneous Q24H   losartan  100 mg Oral q AM   metoprolol succinate  50 mg Oral q morning   rOPINIRole  2 mg Oral QHS   sodium chloride flush  3 mL Intravenous Q12H   Continuous Infusions:  PRN Meds: acetaminophen **OR** acetaminophen, polyethylene glycol  Allergies:    Allergies  Allergen Reactions   Sulfa Antibiotics Hives   Codeine Nausea And Vomiting   Prednisone Other (See Comments)    Shuts down adrenals   Zolpidem  Tartrate Other (See Comments)    sleepwalks   Adhesive [Tape] Rash    blisters    Social History:   Social History   Socioeconomic History   Marital status: Widowed    Spouse name: [name]    Number of children: 5   Years of education: College   Highest education level: Not on file  Occupational History    Comment: retired  Tobacco Use   Smoking status: Never   Smokeless tobacco: Never  Vaping Use   Vaping Use: Never used  Substance and Sexual Activity   Alcohol use: Yes    Alcohol/week: 1.0 standard drink of alcohol    Types: 1 Glasses of wine per week    Comment: one a day   Drug use: No   Sexual activity: Not Currently  Other Topics Concern   Not on file  Social History Narrative   Patient is retired and married. [name])   Patient has some college education.    Patient has 5 children.    Right handed   Caffeine two cups of caffeine daily ( coffee)   Social Determinants of Health   Financial Resource Strain: Not on file  Food Insecurity: Not on file  Transportation Needs: Not on file  Physical Activity: Not on file  Stress: Not on file  Social Connections: Not on file  Intimate Partner Violence: Not on file    Family History:   Family History  Problem Relation Age of Onset   Colon cancer Neg Hx    Esophageal cancer Neg Hx    Rectal cancer Neg Hx    Stomach cancer Neg Hx      ROS:  Please see the history of present illness.  All other ROS reviewed and negative.     Physical Exam/Data:   Vitals:   12/08/21 2045 12/09/21 0016 12/09/21 0441 12/09/21 0826  BP: (!) 155/83 (!) 158/92 (!) 150/82 (!) 141/71  Pulse: 70 81 69 69  Resp: '20 20 20 16  '$ Temp: 98.2 F (36.8 C) 98.5 F (36.9 C) 98.8 F (37.1 C) 98.2 F (36.8 C)  TempSrc: Oral Oral Oral Oral  SpO2: 99% 96% 97% 100%  Weight:      Height:        Intake/Output Summary (Last 24 hours) at 12/09/2021 1105 Last data filed at 12/09/2021 0837 Gross per 24 hour  Intake 240 ml  Output --  Net 240  ml      12/08/2021    5:49 PM 08/06/2020    2:51 PM 08/04/2020   10:06 AM  Last 3 Weights  Weight (lbs) 119 lb 8 oz 119 lb 119 lb  Weight (kg) 54.205 kg 53.978 kg 53.978 kg     Body mass index is 24.14 kg/m.  General:  Well nourished, well developed, in no acute distress HEENT:  normal Neck: no JVD Vascular: No carotid bruits; Distal pulses 2+ bilaterally Cardiac:  normal S1, S2; RRR; + murmur  Lungs:  clear to auscultation bilaterally, no wheezing, rhonchi or rales  Abd: soft, nontender, no hepatomegaly  Ext: no edema Musculoskeletal:  No deformities, BUE and BLE strength normal and equal Skin: warm and dry  Neuro:  CNs 2-12 intact, no focal abnormalities noted Psych:  Normal affect   EKG:  The EKG was personally reviewed and demonstrates: Sinus rhythm Telemetry:  Telemetry was personally reviewed and demonstrates: Sinus rhythm  Relevant CV Studies: Pending echocardiogram  Laboratory Data:  High Sensitivity Troponin:   Recent Labs  Lab 12/07/21 1837 12/07/21 2119 12/07/21 2353 12/08/21 0220 12/08/21 0440  TROPONINIHS 72* 68* 72* 67* 53*     Chemistry Recent Labs  Lab 12/07/21 1414 12/07/21 1837 12/08/21 1837 12/09/21 0503  NA 137  --  137 136  K 4.1  --  3.9 4.0  CL 100  --  103 106  CO2 26  --  27 25  GLUCOSE 123*  --  96 90  BUN 34*  --  32* 35*  CREATININE 1.68*  --  1.41* 1.65*  CALCIUM 9.6  --  9.2 9.1  MG  --  2.0  --   --   GFRNONAA 30*  --  37* 31*  ANIONGAP 11  --  7 5     Hematology Recent Labs  Lab 12/07/21 1414 12/08/21 1837 12/09/21 0503  WBC 6.0 5.9 6.0  RBC 4.45 4.18 3.98  HGB 14.2 13.3 12.9  HCT 40.8 39.9 38.0  MCV 91.7 95.5 95.5  MCH 31.9 31.8 32.4  MCHC 34.8 33.3 33.9  RDW 11.9 12.0 12.0  PLT 149* 143* 134*   Thyroid  Recent Labs  Lab 12/07/21 1837  TSH 2.631      Radiology/Studies:  DG Chest 2 View  Result Date: 12/07/2021 CLINICAL DATA:  Atrial fibrillation diagnosed 11/11/2021. Currently wearing a heart  monitor. Had atrial fibrillation episode today. EXAM: CHEST - 2 VIEW COMPARISON:  Chest two views 01/05/2012 FINDINGS: New anterior left chest wall electronic device consistent with reported heart monitor. Cardiac silhouette and mediastinal contours are within normal limits. Mild calcification within aortic arch. No pulmonary edema, pleural effusion, or pneumothorax. Moderate dextrocurvature centered at the thoracolumbar junction is similar to prior. Severe right glenohumeral joint space narrowing, subchondral sclerosis, and subchondral degenerative cysts. IMPRESSION: No acute cardiopulmonary disease process. Electronically Signed   By: Yvonne Kendall M.D.   On: 12/07/2021 14:39     Assessment and Plan:   New onset atrial fibrillation with rapid ventricular rate I have personally reviewed her strip on Apple phone showing atrial fibrillation on 8/2 and 8/28.  Currently maintaining sinus rhythm since admit.  TSH normal.  Will continue Toprol-XL 50 mg daily.  Will review titration of beta-blocker or addition of antiarrhythmic per MD.  Will transition Lovenox to Eliquis.  Pending echocardiogram.  She has  to return her ZIO monitor for result.  2.  Elevated troponin -Flat downtrending trend. -Likely due to tachycardia -No exertional chest pain or shortness of breath  3.  Murmur -Pending echocardiogram  4.  CKD stage III -Renal function stable at 1.4-1.6 -Continue losartan  5.  Hypertension -Blood pressure stable on losartan and beta-blocker   Risk Assessment/Risk Scores:       CHA2DS2-VASc Score = 4   This indicates a 4.8% annual risk of stroke. The patient's score is based upon: CHF History: 0 HTN  History: 1 Diabetes History: 0 Stroke History: 0 Vascular Disease History: 0 Age Score: 2 Gender Score: 1   For questions or updates, please contact [place] Please consult [email or web address] for contact info under   Jarrett Soho, PA  12/09/2021 11:05 AM   Patient  seen and examined, note reviewed with the signed Advanced Practice Provider. I personally reviewed laboratory data, imaging studies and relevant notes. I independently examined the patient and formulated the important aspects of the plan. I have personally discussed the plan with the patient and/or family. Comments or changes to the note/plan are indicated below.  Seen examined her bedside. Please transition to oral anticoagulation Eliquis 5 mg twice daily.  Increase Toprol-XL to 50 mg daily if blood pressure cannot tolerate okay to go down losartan.  Echo pending Elevated troponin is demand ischemia no need for any ischemic evaluation at this time.  Berniece Salines DO, MS [facility] Attending Cardiologist [place]  [address] [place], [place] [address] [phone] Website: [email or web address]

## 2021-12-09 NOTE — Progress Notes (Signed)
[place] for Eliquis Indication: atrial fibrillation  Allergies  Allergen Reactions   Sulfa Antibiotics Hives   Codeine Nausea And Vomiting   Prednisone Other (See Comments)    Shuts down adrenals   Zolpidem Tartrate Other (See Comments)    sleepwalks   Adhesive [Tape] Rash    blisters    Patient Measurements: Height: '4\' 11"'$  (149.9 cm) Weight: 54.2 kg (119 lb 8 oz) IBW/kg (Calculated) : 43.2  Vital Signs: Temp: 97.5 F (36.4 C) (08/30 1232) Temp Source: Oral (08/30 1232) BP: 152/74 (08/30 1232) Pulse Rate: 65 (08/30 1232)  Labs: Recent Labs    12/07/21 1414 12/07/21 1837 12/07/21 2353 12/08/21 0220 12/08/21 0440 12/08/21 1837 12/09/21 0503  HGB 14.2  --   --   --   --  13.3 12.9  HCT 40.8  --   --   --   --  39.9 38.0  PLT 149*  --   --   --   --  143* 134*  LABPROT 13.0  --   --   --   --   --   --   INR 1.0  --   --   --   --   --   --   CREATININE 1.68*  --   --   --   --  1.41* 1.65*  TROPONINIHS <2   < > 72* 67* 53*  --   --    < > = values in this interval not displayed.    Estimated Creatinine Clearance: 20.1 mL/min (A) (by C-G formula based on SCr of 1.65 mg/dL (H)).   Medical History: Past Medical History:  Diagnosis Date   A-fib ([place])    Anemia    pt. denies   Arthritis    Complication of anesthesia    GERD (gastroesophageal reflux disease)    Heart murmur    evaluated 63yrago-no cardiac follow up needed   Hypertension    PONV (postoperative nausea and vomiting)    Postop Acute blood loss anemia 01/06/2012   Postop Hyponatremia 01/06/2012   Postop Transfusion 01/07/2012   Restless leg       Assessment: 81year old female with new onset atrial fibrillation. Pharmacy consulted to dose Eliquis. Patient is > 89years old and weight < 60 kg, SCr > 1.5 but not far off baseline.    Plan:  -Eliquis 2.5 mg BID  ATawnya Crook PharmD, BCPS Clinical Pharmacist 12/09/2021 12:58 PM

## 2021-12-09 NOTE — Plan of Care (Signed)
  Problem: Education: Goal: Knowledge of General Education information will improve Description: Including pain rating scale, medication(s)/side effects and non-pharmacologic comfort measures Outcome: Progressing   Problem: Health Behavior/Discharge Planning: Goal: Ability to manage health-related needs will improve Outcome: Progressing   Problem: Clinical Measurements: Goal: Will remain free from infection Outcome: Progressing Goal: Cardiovascular complication will be avoided Outcome: Progressing   Problem: Activity: Goal: Risk for activity intolerance will decrease Outcome: Progressing   Problem: Nutrition: Goal: Adequate nutrition will be maintained Outcome: Progressing   Problem: Coping: Goal: Level of anxiety will decrease Outcome: Progressing   Problem: Safety: Goal: Ability to remain free from injury will improve Outcome: Progressing   Problem: Skin Integrity: Goal: Risk for impaired skin integrity will decrease Outcome: Progressing

## 2021-12-09 NOTE — Progress Notes (Signed)
Mobility Specialist - Progress Note   Pre-mobility: 71 bmp HR During mobility: 87 bmp HR Post-mobility: 75 bmp HR   12/09/21 1028  Mobility  HOB Elevated/Bed Position Self regulated  Activity Ambulated with assistance in hallway  Range of Motion/Exercises Active  Level of Assistance Modified independent, requires aide device or extra time  Assistive Device  [facility])  Distance Ambulated (ft) 300 ft  Activity Response Tolerated well  $Mobility charge 1 Mobility   Pt was found in bed and agreeable to mobilize. Pt stated having right knee pain while ambulating and took 1 standing rest break due to the pain. At EOS returned back to bed with all necessities in reach.   Ferd Hibbs Mobility Specialist

## 2021-12-10 ENCOUNTER — Telehealth: Payer: Self-pay | Admitting: Cardiology

## 2021-12-10 NOTE — Telephone Encounter (Signed)
Patient scheduled as a NP with Dr. Harrell Gave but saw Dr. Harriet Masson in the hospital. She is requesting to stay with Dr. Harrell Gave. Do you all agree to the switch?

## 2021-12-11 NOTE — Telephone Encounter (Signed)
Ok by me, thanks

## 2021-12-17 DIAGNOSIS — H43813 Vitreous degeneration, bilateral: Secondary | ICD-10-CM | POA: Diagnosis not present

## 2021-12-17 DIAGNOSIS — H16223 Keratoconjunctivitis sicca, not specified as Sjogren's, bilateral: Secondary | ICD-10-CM | POA: Diagnosis not present

## 2021-12-17 DIAGNOSIS — H524 Presbyopia: Secondary | ICD-10-CM | POA: Diagnosis not present

## 2021-12-17 DIAGNOSIS — Z961 Presence of intraocular lens: Secondary | ICD-10-CM | POA: Diagnosis not present

## 2021-12-18 DIAGNOSIS — R002 Palpitations: Secondary | ICD-10-CM | POA: Diagnosis not present

## 2021-12-18 DIAGNOSIS — I499 Cardiac arrhythmia, unspecified: Secondary | ICD-10-CM | POA: Diagnosis not present

## 2021-12-21 DIAGNOSIS — M1711 Unilateral primary osteoarthritis, right knee: Secondary | ICD-10-CM | POA: Diagnosis not present

## 2021-12-31 ENCOUNTER — Ambulatory Visit (HOSPITAL_BASED_OUTPATIENT_CLINIC_OR_DEPARTMENT_OTHER): Payer: PPO | Admitting: Cardiology

## 2021-12-31 ENCOUNTER — Encounter (HOSPITAL_BASED_OUTPATIENT_CLINIC_OR_DEPARTMENT_OTHER): Payer: Self-pay | Admitting: Cardiology

## 2021-12-31 VITALS — BP 178/84 | HR 71 | Ht 59.0 in | Wt 124.7 lb

## 2021-12-31 DIAGNOSIS — I340 Nonrheumatic mitral (valve) insufficiency: Secondary | ICD-10-CM | POA: Diagnosis not present

## 2021-12-31 DIAGNOSIS — I10 Essential (primary) hypertension: Secondary | ICD-10-CM | POA: Diagnosis not present

## 2021-12-31 DIAGNOSIS — N1832 Chronic kidney disease, stage 3b: Secondary | ICD-10-CM

## 2021-12-31 DIAGNOSIS — I480 Paroxysmal atrial fibrillation: Secondary | ICD-10-CM

## 2021-12-31 DIAGNOSIS — M1711 Unilateral primary osteoarthritis, right knee: Secondary | ICD-10-CM | POA: Diagnosis not present

## 2021-12-31 DIAGNOSIS — R002 Palpitations: Secondary | ICD-10-CM | POA: Diagnosis not present

## 2021-12-31 DIAGNOSIS — I351 Nonrheumatic aortic (valve) insufficiency: Secondary | ICD-10-CM | POA: Diagnosis not present

## 2021-12-31 NOTE — Progress Notes (Signed)
Cardiology Office Note:    Date:  12/31/2021   ID:  Yukari, Flax Oct 13, 1940, MRN [id number]  PCP:  Shon Baton, MD  Cardiologist:  Buford Dresser, MD  Referring MD: Elgie Congo, MD   CC: new patient to me/ evaluation for palpitations  History of Present Illness:    Nancy Baxter is a 81 y.o. female with a hx of atrial fibrillation, heart murmur, hypertension, CKD III, GERD, and arthritis, who is seen as a new consult at the request of Elgie Congo, MD for the evaluation and management of palpitations. She was seen by Dr. Harriet Masson in the hospital on 12/09/21.  She was admitted to the hospital 12/07/2021 for palpitations. She stated that her Apple Watch noted that she was in A-fib.  EKG showed sinus rhythm in the ED. Cardiology was consulted and felt that she had new onset atrial fibrillation. They had increased her Toprol dose to 50 to 75 mg daily. Based on dosing requirements, she was started on 2.5 mg of Eliquis twice daily. Troponin was mildly elevated, thought to be likely secondary to A-fib with RVR. She was referred for cardiology outpatient follow-up.  Tachycardia/palpitations: -Initial onset: Recently her hip became dislocated and she was unable to reach a phone, so she now has a smart watch that detected Afib. Initially she noticed rapid heart rates and palpitations that woke her up from sleep 11/11/21. On 8/28 her episodes of palpitations occurred twice, prompting her visit to the ED. -Frequency/Duration: Intermittent. Her longest episode of Afib per her monitor was 1 hr and 46 minutes. -Associated symptoms: Rapid heart rate -Prior cardiac history: When she was 81 yo she developed hypertension. She was also told that she had a heart murmur. -Prior workup: Her heart monitor showed 184 episodes of SVT, longest 24.4 seconds with an average rate of 113 bpm. Of note, she states that she was generally unaware of any palpitations correlating with her SVT. -Possible  medication interactions: She endorses having adverse reactions to many medications, including ambien. Currently she believes one of her medications is causing widespread pruritis and raised lesions/knots . This has also occurred previously when she was taking steroids, antibiotics, or had contact with adhesives. -Alcohol: Occasional glass of wine. -Comorbidities: Hypertension. In clinic today her blood pressure is elevated at 174/90. Lately she has some difficulty with using her blood pressure cuff at home as it frequently registers as "error." She will plan to replace her cuff. When she is able to check it at home she usually has readings around 148/80s-90s. -Labs: TSH, kidney function/electrolytes, CBC reviewed. -Cardiac ROS: no chest pain, no shortness of breath, no PND, no orthopnea, no LE edema. -Family history: She denies any significant family history of cardiovascular disease. Her father died of emphysema.  Overall she is feeling fine aside from her knee pain. She notes that she will eventually need a knee replacement. Recently she received a steroidal injection for pain management, and developed an area of purplish discolored skin of her knee.  She remains compliant with Eliquis.  Past Medical History:  Diagnosis Date   A-fib ([place])    Anemia    pt. denies   Arthritis    Complication of anesthesia    GERD (gastroesophageal reflux disease)    Heart murmur    evaluated 25yrago-no cardiac follow up needed   Hypertension    PONV (postoperative nausea and vomiting)    Postop Acute blood loss anemia 01/06/2012   Postop Hyponatremia 01/06/2012  Postop Transfusion 01/07/2012   Restless leg     Past Surgical History:  Procedure Laterality Date   BACK SURGERY  1961   spinal fusion age    46 TUNNEL RELEASE Left 05/18/2016   Procedure: LEFT CARPAL TUNNEL RELEASE;  Surgeon: Daryll Brod, MD;  Location: [facility];  Service: Orthopedics;  Laterality: Left;   EAR CYST  EXCISION  02/23/2011   pt. denies   FOOT ARTHRODESIS  5/12   hammer toes,bunionectomy   JOINT REPLACEMENT  2006   rt hip   JOINT REPLACEMENT  2008   lt hip   TONSILLECTOMY     age 70   TOTAL HIP REVISION  01/05/2012   Procedure: TOTAL HIP REVISION;  Surgeon: Gearlean Alf, MD;  Location: [facility];  Service: Orthopedics;  Laterality: Left;   TOTAL HIP REVISION Left 08/06/2020   Procedure: Left hip bearing surface revision;  Surgeon: Gaynelle Arabian, MD;  Location: [facility];  Service: Orthopedics;  Laterality: Left;  60mn   TRIGGER FINGER RELEASE  02/23/2011   Procedure: RELEASE TRIGGER FINGER/A-1 PULLEY;  Surgeon: GWynonia Sours MD;  Location: [facility]  Service: Orthopedics;  Laterality: Right;   TRIGGER FINGER RELEASE Left 07/26/2012   Procedure: RELEASE TRIGGER FINGER/A-1 PULLEY LEFT SMALL FINGER;  Surgeon: GWynonia Sours MD;  [place] [facility]  Service: Orthopedics;  Laterality: Left;   TRIGGER FINGER RELEASE Left 05/18/2016   Procedure: RELEASE TRIGGER FINGER/A-1 PULLEY LEFT INDEX;  Surgeon: GDaryll Brod MD;  [facility] [facility]  Service: Orthopedics;  Laterality: Left;   VAGINAL HYSTERECTOMY      Current Medications: Current Outpatient Medications on File Prior to Visit  Medication Sig   acetaminophen (TYLENOL) 650 MG CR tablet Take 1,300 mg by mouth 2 (two) times daily.   apixaban (ELIQUIS) 2.5 MG TABS tablet Take 1 tablet (2.5 mg total) by mouth 2 (two) times daily.   cholecalciferol (VITAMIN D3) 25 MCG (1000 UNIT) tablet Take 1,000 Units by mouth daily.   cyanocobalamin (VITAMIN B12) 1000 MCG tablet Take 2,000 mcg by mouth daily.   furosemide (LASIX) 20 MG tablet Take 40 mg by mouth in the morning.   losartan (COZAAR) 50 MG tablet Take 50 mg by mouth in the morning.   metoprolol succinate (TOPROL-XL) 25 MG 24 hr tablet Take 3 tablets (75 mg total) by mouth every morning. Take with or immediately following a meal.   rOPINIRole (REQUIP  XL) 2 MG 24 hr tablet TAKE 1 TABLET BY MOUTH AT 6 IN THE EVENING AND 1 TABLET EVERY NIGHT AT BEDTIME (Patient taking differently: 4 mg at bedtime.)   zinc gluconate 50 MG tablet Take 50 mg by mouth daily.   No current facility-administered medications on file prior to visit.     Allergies:   Sulfa antibiotics, Codeine, Prednisone, Zolpidem tartrate, and Adhesive [tape]   Social History   Tobacco Use   Smoking status: Never   Smokeless tobacco: Never  Vaping Use   Vaping Use: Never used  Substance Use Topics   Alcohol use: Yes    Alcohol/week: 1.0 standard drink of alcohol    Types: 1 Glasses of wine per week    Comment: one a day   Drug use: No    Family History: family history is negative for Colon cancer, Esophageal cancer, Rectal cancer, and Stomach cancer.  ROS:   Please see the history of present illness.  Additional pertinent ROS: Constitutional: Negative for chills, fever,  night sweats, unintentional weight loss  HENT: Negative for ear pain and hearing loss.   Eyes: Negative for loss of vision and eye pain.  Respiratory: Negative for cough, sputum, wheezing.   Cardiovascular: See HPI. Gastrointestinal: Negative for abdominal pain, melena, and hematochezia.  Genitourinary: Negative for dysuria and hematuria.  Musculoskeletal: Negative for falls and myalgias. Positive for knee pain. Skin: Positive for itching and rash, purple discoloration of knee.  Neurological: Negative for focal weakness, focal sensory changes and loss of consciousness.  Endo/Heme/Allergies: Does not bruise/bleed easily.     EKGs/Labs/Other Studies Reviewed:    The following studies were reviewed today:  Monitor  12/2021: HR 46 - 184 bpm, average 71 bpm. 184 SVT, longest 24.4 seconds with an average rate of 113 bpm. Review of rhythm strip suggests AT. 1% AF/AFL burden (average ventricular rate 127 bpm), longest episode 1hr70mn Occasional supraventricular ectopy, 3.2%. Rare ventricular  ectopy.  Echo  12/09/2021:  1. Left ventricular ejection fraction, by estimation, is 60 to 65%. The  left ventricle has normal function. The left ventricle has no regional  wall motion abnormalities. Left ventricular diastolic parameters were  normal.   2. Right ventricular systolic function is normal. The right ventricular  size is normal. Tricuspid regurgitation signal is inadequate for assessing  PA pressure.   3. Left atrial size was moderately dilated.   4. The mitral valve is normal in structure. Mild to moderate mitral valve  regurgitation.   5. The aortic valve is tricuspid. There is mild calcification of the  aortic valve. There is mild thickening of the aortic valve. Aortic valve  regurgitation is moderate. Aortic valve sclerosis is present, with no  evidence of aortic valve stenosis.   6. There is borderline dilatation of the ascending aorta, measuring 37  mm.   7. The inferior vena cava is dilated in size with >50% respiratory  variability, suggesting right atrial pressure of 8 mmHg.    EKG:  EKG is personally reviewed.   12/31/2021:  NSR at 71 bpm, PRWP  Recent Labs: 12/07/2021: Magnesium 2.0; TSH 2.631 12/09/2021: BUN 35; Creatinine, Ser 1.65; Hemoglobin 12.9; Platelets 134; Potassium 4.0; Sodium 136   Recent Lipid Panel No results found for: "CHOL", "TRIG", "HDL", "CHOLHDL", "VLDL", "LDLCALC", "LDLDIRECT"  Physical Exam:    VS:  BP (!) 178/84 (BP Location: Right Arm, Patient Position: Sitting, Cuff Size: Normal)   Pulse 71   Ht '4\' 11"'$  (1.499 m)   Wt 124 lb 11.2 oz (56.6 kg)   BMI 25.19 kg/m     Wt Readings from Last 3 Encounters:  12/31/21 124 lb 11.2 oz (56.6 kg)  12/08/21 119 lb 8 oz (54.2 kg)  08/06/20 119 lb (54 kg)    GEN: Well nourished, well developed in no acute distress HEENT: Normal, moist mucous membranes NECK: No JVD CARDIAC: regular rhythm, normal S1 and S2, no rubs or gallops. 2/6 mitral valve murmur. Aortic valve diastolic murmur not  appreciated. VASCULAR: Radial and DP pulses 2+ bilaterally. No carotid bruits RESPIRATORY:  Clear to auscultation without rales, wheezing or rhonchi  ABDOMEN: Soft, non-tender, non-distended MUSCULOSKELETAL:  Ambulates independently SKIN: Warm and dry, no edema NEUROLOGIC:  Alert and oriented x 3. No focal neuro deficits noted. PSYCHIATRIC:  Normal affect    ASSESSMENT:    1. Heart palpitations   2. Essential hypertension   3. Nonrheumatic mitral valve regurgitation   4. Nonrheumatic aortic valve insufficiency   5. Paroxysmal atrial fibrillation (HCC)   6. Stage 3b chronic  kidney disease (HCC)    PLAN:    Palpitations Paroxysmal atrial fibrillation -CHA2DS2/VAS Stroke Risk Points=4 -continue apixaban -on metoprolol succinate 75 mg daily for rate control -in SR today -normal EF on echo -monitor with pAfib/flutter, 1% burden, longest episode 1 hr 46 min. Also noted atach episodes  Hypertension Chronic kidney disease stage 3b -very elevated in office and on recheck -currently on furosemide 40 mg daily and losartan 50 mg daily -elevated today, reports typically better controlled. Discussed options, adjusting meds vs home BP monitoring. She will monitor at home and send reading via mychart in about 2 weeks.  -with CKD and need for afterload reduction, amlodipine may be a good option  Mitral regurgitation Aortic regurgitation -clinically euvolemic -optimize afterload/BP  Cardiac risk counseling and prevention recommendations: -recommend heart healthy/Mediterranean diet, with whole grains, fruits, vegetable, fish, lean meats, nuts, and olive oil. Limit salt. -recommend moderate walking, 3-5 times/week for 30-50 minutes each session. Aim for at least 150 minutes.week. Goal should be pace of 3 miles/hours, or walking 1.5 miles in 30 minutes -recommend avoidance of tobacco products. Avoid excess alcohol.  Plan for follow up: 3 months or sooner as needed.  Total time of  encounter: 45 minutes total time of encounter, including 35 minutes spent in face-to-face patient care. This time includes coordination of care and counseling regarding above conditions. Remainder of non-face-to-face time involved reviewing chart documents/testing relevant to the patient encounter and documentation in the medical record.  Buford Dresser, MD, PhD, [facility]    Medication Adjustments/Labs and Tests Ordered: Current medicines are reviewed at length with the patient today.  Concerns regarding medicines are outlined above.   Orders Placed This Encounter  Procedures   EKG 12-Lead   No orders of the defined types were placed in this encounter.  Patient Instructions  Medication Instructions:  Your physician recommends that you continue on your current medications as directed. Please refer to the Current Medication list given to you today.   Labwork: NONE  Testing/Procedures: NONE  Follow-Up: 3 MONTHS WITH DR Harrell Gave   Any Other Special Instructions Will Be Listed Below (If Applicable).  how to check blood pressure:  -sit comfortably in a chair, feet uncrossed and flat on floor, for 5-10 minutes  -arm ideally should rest at the level of the heart. However, arm should be relaxed and not tense (for example, do not hold the arm up unsupported)  -avoid exercise, caffeine, and tobacco for at least 30 minutes prior to BP reading  -don't take BP cuff reading over clothes (always place on skin directly)  -I prefer to know how well the medication is working, so I would like you to take your readings 1-2 hours after taking your blood pressure medication if possible   CHECK YOUR BLOOD PRESSURE DAILY, SEND UPDATED BLOOD PRESSURES IN ABOUT 2 WEEKS IN [facility]    I,Mathew Stumpf,acting as a scribe for [employer], MD.,have documented all relevant documentation on the behalf of Buford Dresser, MD,as directed by  Buford Dresser,  MD while in the presence of Buford Dresser, MD.  I, Buford Dresser, MD, have reviewed all documentation for this visit. The documentation on 01/13/22 for the exam, diagnosis, procedures, and orders are all accurate and complete.   Signed, Buford Dresser, MD PhD 12/31/2021     [place]

## 2021-12-31 NOTE — Patient Instructions (Addendum)
Medication Instructions:  Your physician recommends that you continue on your current medications as directed. Please refer to the Current Medication list given to you today.   Labwork: NONE  Testing/Procedures: NONE  Follow-Up: 3 MONTHS WITH DR Harrell Gave   Any Other Special Instructions Will Be Listed Below (If Applicable).  how to check blood pressure:  -sit comfortably in a chair, feet uncrossed and flat on floor, for 5-10 minutes  -arm ideally should rest at the level of the heart. However, arm should be relaxed and not tense (for example, do not hold the arm up unsupported)  -avoid exercise, caffeine, and tobacco for at least 30 minutes prior to BP reading  -don't take BP cuff reading over clothes (always place on skin directly)  -I prefer to know how well the medication is working, so I would like you to take your readings 1-2 hours after taking your blood pressure medication if possible   CHECK YOUR BLOOD PRESSURE DAILY, SEND UPDATED BLOOD PRESSURES IN ABOUT 2 WEEKS IN [facility]

## 2022-01-04 ENCOUNTER — Encounter (HOSPITAL_BASED_OUTPATIENT_CLINIC_OR_DEPARTMENT_OTHER): Payer: Self-pay

## 2022-01-04 DIAGNOSIS — I480 Paroxysmal atrial fibrillation: Secondary | ICD-10-CM

## 2022-01-04 MED ORDER — METOPROLOL SUCCINATE ER 25 MG PO TB24: 75.0000 mg | ORAL_TABLET | Freq: Every morning | ORAL | 3 refills | Status: DC

## 2022-01-04 MED ORDER — APIXABAN 2.5 MG PO TABS: 2.5000 mg | ORAL_TABLET | Freq: Two times a day (BID) | ORAL | 11 refills | Status: DC

## 2022-01-04 NOTE — Telephone Encounter (Signed)
Okay to refill in your name?

## 2022-01-05 DIAGNOSIS — M1711 Unilateral primary osteoarthritis, right knee: Secondary | ICD-10-CM | POA: Diagnosis not present

## 2022-01-11 DIAGNOSIS — I129 Hypertensive chronic kidney disease with stage 1 through stage 4 chronic kidney disease, or unspecified chronic kidney disease: Secondary | ICD-10-CM | POA: Diagnosis not present

## 2022-01-13 DIAGNOSIS — I351 Nonrheumatic aortic (valve) insufficiency: Secondary | ICD-10-CM | POA: Insufficient documentation

## 2022-01-13 DIAGNOSIS — N1832 Chronic kidney disease, stage 3b: Secondary | ICD-10-CM | POA: Insufficient documentation

## 2022-01-13 DIAGNOSIS — I340 Nonrheumatic mitral (valve) insufficiency: Secondary | ICD-10-CM | POA: Insufficient documentation

## 2022-01-18 DIAGNOSIS — N1831 Chronic kidney disease, stage 3a: Secondary | ICD-10-CM | POA: Diagnosis not present

## 2022-01-18 DIAGNOSIS — Z0000 Encounter for general adult medical examination without abnormal findings: Secondary | ICD-10-CM | POA: Diagnosis not present

## 2022-01-18 DIAGNOSIS — R82998 Other abnormal findings in urine: Secondary | ICD-10-CM | POA: Diagnosis not present

## 2022-01-18 DIAGNOSIS — M1990 Unspecified osteoarthritis, unspecified site: Secondary | ICD-10-CM | POA: Diagnosis not present

## 2022-01-18 DIAGNOSIS — G2581 Restless legs syndrome: Secondary | ICD-10-CM | POA: Diagnosis not present

## 2022-01-18 DIAGNOSIS — I129 Hypertensive chronic kidney disease with stage 1 through stage 4 chronic kidney disease, or unspecified chronic kidney disease: Secondary | ICD-10-CM | POA: Diagnosis not present

## 2022-01-18 DIAGNOSIS — D696 Thrombocytopenia, unspecified: Secondary | ICD-10-CM | POA: Diagnosis not present

## 2022-01-18 DIAGNOSIS — D6869 Other thrombophilia: Secondary | ICD-10-CM | POA: Diagnosis not present

## 2022-01-18 DIAGNOSIS — I480 Paroxysmal atrial fibrillation: Secondary | ICD-10-CM | POA: Diagnosis not present

## 2022-01-18 DIAGNOSIS — F4320 Adjustment disorder, unspecified: Secondary | ICD-10-CM | POA: Diagnosis not present

## 2022-01-18 DIAGNOSIS — Z7901 Long term (current) use of anticoagulants: Secondary | ICD-10-CM | POA: Diagnosis not present

## 2022-01-18 DIAGNOSIS — M169 Osteoarthritis of hip, unspecified: Secondary | ICD-10-CM | POA: Diagnosis not present

## 2022-01-18 DIAGNOSIS — Z1331 Encounter for screening for depression: Secondary | ICD-10-CM | POA: Diagnosis not present

## 2022-02-24 DIAGNOSIS — Z1152 Encounter for screening for COVID-19: Secondary | ICD-10-CM | POA: Diagnosis not present

## 2022-02-24 DIAGNOSIS — R059 Cough, unspecified: Secondary | ICD-10-CM | POA: Diagnosis not present

## 2022-02-24 DIAGNOSIS — Z7901 Long term (current) use of anticoagulants: Secondary | ICD-10-CM | POA: Diagnosis not present

## 2022-02-24 DIAGNOSIS — I129 Hypertensive chronic kidney disease with stage 1 through stage 4 chronic kidney disease, or unspecified chronic kidney disease: Secondary | ICD-10-CM | POA: Diagnosis not present

## 2022-02-24 DIAGNOSIS — I480 Paroxysmal atrial fibrillation: Secondary | ICD-10-CM | POA: Diagnosis not present

## 2022-02-24 DIAGNOSIS — R5383 Other fatigue: Secondary | ICD-10-CM | POA: Diagnosis not present

## 2022-02-24 DIAGNOSIS — N1831 Chronic kidney disease, stage 3a: Secondary | ICD-10-CM | POA: Diagnosis not present

## 2022-02-24 DIAGNOSIS — B349 Viral infection, unspecified: Secondary | ICD-10-CM | POA: Diagnosis not present

## 2022-02-24 DIAGNOSIS — R0981 Nasal congestion: Secondary | ICD-10-CM | POA: Diagnosis not present

## 2022-02-24 DIAGNOSIS — I340 Nonrheumatic mitral (valve) insufficiency: Secondary | ICD-10-CM | POA: Diagnosis not present

## 2022-02-26 DIAGNOSIS — M1711 Unilateral primary osteoarthritis, right knee: Secondary | ICD-10-CM | POA: Diagnosis not present

## 2022-04-01 ENCOUNTER — Encounter (HOSPITAL_BASED_OUTPATIENT_CLINIC_OR_DEPARTMENT_OTHER): Payer: Self-pay | Admitting: Cardiology

## 2022-04-01 ENCOUNTER — Ambulatory Visit (INDEPENDENT_AMBULATORY_CARE_PROVIDER_SITE_OTHER): Payer: PPO | Admitting: Cardiology

## 2022-04-01 VITALS — BP 148/74 | HR 68 | Ht 59.0 in | Wt 121.7 lb

## 2022-04-01 DIAGNOSIS — I351 Nonrheumatic aortic (valve) insufficiency: Secondary | ICD-10-CM | POA: Diagnosis not present

## 2022-04-01 DIAGNOSIS — I340 Nonrheumatic mitral (valve) insufficiency: Secondary | ICD-10-CM | POA: Diagnosis not present

## 2022-04-01 DIAGNOSIS — N1832 Chronic kidney disease, stage 3b: Secondary | ICD-10-CM

## 2022-04-01 DIAGNOSIS — I480 Paroxysmal atrial fibrillation: Secondary | ICD-10-CM | POA: Diagnosis not present

## 2022-04-01 DIAGNOSIS — I10 Essential (primary) hypertension: Secondary | ICD-10-CM | POA: Diagnosis not present

## 2022-04-01 NOTE — Patient Instructions (Addendum)
Medication Instructions:  Your physician recommends that you continue on your current medications as directed. Please refer to the Current Medication list given to you today.   Labwork: NONE  Testing/Procedures: NONE  Follow-Up: 3 MONTHS  Any Other Special Instructions Will Be Listed Below (If Applicable).  Avoid any medication that has pseudophedrine or phenylephrine (common decongestants), as these can raise your blood pressure. Medications like dextromethorphan or guaifenisin are ok.  Look for a blood pressure monitor that has a D ring cuff (Omron is a good brand).  Check some blood pressure readings at home. Goal is 130 or less on the top number, 80 or less on the bottom number.  -how to check blood pressure:  -sit comfortably in a chair, feet uncrossed and flat on floor, for 5-10 minutes  -arm ideally should rest at the level of the heart. However, arm should be relaxed and not tense (for example, do not hold the arm up unsupported)  -avoid exercise, caffeine, and tobacco for at least 30 minutes prior to BP reading  -don't take BP cuff reading over clothes (always place on skin directly)  -I prefer to know how well the medication is working, so I would like you to take your readings 1-2 hours after taking your blood pressure medication if possible
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
## 2022-05-04 ENCOUNTER — Encounter (HOSPITAL_BASED_OUTPATIENT_CLINIC_OR_DEPARTMENT_OTHER): Payer: Self-pay

## 2022-05-04 NOTE — Telephone Encounter (Signed)
Advised patient to discontinue metop, scheduled her OV 2/2, please advise for any other immediate changes before appointment.

## 2022-05-05 ENCOUNTER — Encounter (HOSPITAL_BASED_OUTPATIENT_CLINIC_OR_DEPARTMENT_OTHER): Payer: Self-pay

## 2022-05-14 ENCOUNTER — Encounter (HOSPITAL_BASED_OUTPATIENT_CLINIC_OR_DEPARTMENT_OTHER): Payer: Self-pay | Admitting: Cardiology

## 2022-05-14 ENCOUNTER — Ambulatory Visit (INDEPENDENT_AMBULATORY_CARE_PROVIDER_SITE_OTHER): Payer: PPO | Admitting: Cardiology

## 2022-05-14 VITALS — BP 160/80 | HR 91 | Ht 59.0 in | Wt 123.0 lb

## 2022-05-14 DIAGNOSIS — I480 Paroxysmal atrial fibrillation: Secondary | ICD-10-CM

## 2022-05-14 DIAGNOSIS — N1832 Chronic kidney disease, stage 3b: Secondary | ICD-10-CM | POA: Diagnosis not present

## 2022-05-14 DIAGNOSIS — I340 Nonrheumatic mitral (valve) insufficiency: Secondary | ICD-10-CM

## 2022-05-14 DIAGNOSIS — I351 Nonrheumatic aortic (valve) insufficiency: Secondary | ICD-10-CM | POA: Diagnosis not present

## 2022-05-14 DIAGNOSIS — I10 Essential (primary) hypertension: Secondary | ICD-10-CM

## 2022-05-14 DIAGNOSIS — R002 Palpitations: Secondary | ICD-10-CM

## 2022-05-14 MED ORDER — DILTIAZEM HCL ER COATED BEADS 120 MG PO CP24: 120.0000 mg | ORAL_CAPSULE | Freq: Every day | ORAL | 3 refills | Status: DC

## 2022-05-14 NOTE — Patient Instructions (Signed)
Medication Instructions:  Your physician has recommended you make the following change in your medication:   Stop: Metoprolol   Start: Diltiazem  '120mg'$  daily   *If you need a refill on your cardiac medications before your next appointment, please call your pharmacy*  Follow-Up: At [facility], you and your health needs are our priority.  As part of our continuing mission to provide you with exceptional heart care, we have created designated Provider Care Teams.  These Care Teams include your primary Cardiologist (physician) and Advanced Practice Providers (APPs -  Physician Assistants and Nurse Practitioners) who all work together to provide you with the care you need, when you need it.  We recommend signing up for the patient portal called "MyChart".  Sign up information is provided on this After Visit Summary.  MyChart is used to connect with patients for Virtual Visits (Telemedicine).  Patients are able to view lab/test results, encounter notes, upcoming appointments, etc.  Non-urgent messages can be sent to your provider as well.   To learn more about what you can do with MyChart, go to [email or web address].    Your next appointment:   4 month(s)  Provider:   Buford Dresser, MD
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
## 2022-05-17 ENCOUNTER — Encounter (HOSPITAL_BASED_OUTPATIENT_CLINIC_OR_DEPARTMENT_OTHER): Payer: Self-pay

## 2022-05-21 NOTE — Telephone Encounter (Signed)
Please advise for new medical therapy, patient currently stable

## 2022-05-24 ENCOUNTER — Telehealth: Payer: Self-pay | Admitting: Cardiology
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
## 2022-05-24 NOTE — Telephone Encounter (Signed)
I would recommend she stop the medication and monitor how often she has symptoms off of the medication. We have limited options, so if she can tolerate symptoms being on no medications that might be the best option.
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
## 2022-06-14 ENCOUNTER — Ambulatory Visit (HOSPITAL_BASED_OUTPATIENT_CLINIC_OR_DEPARTMENT_OTHER): Payer: PPO | Admitting: Cardiology

## 2022-06-16 ENCOUNTER — Encounter (HOSPITAL_BASED_OUTPATIENT_CLINIC_OR_DEPARTMENT_OTHER): Payer: Self-pay | Admitting: Cardiology

## 2022-06-17 DIAGNOSIS — I480 Paroxysmal atrial fibrillation: Secondary | ICD-10-CM | POA: Diagnosis not present

## 2022-06-17 DIAGNOSIS — I8390 Asymptomatic varicose veins of unspecified lower extremity: Secondary | ICD-10-CM | POA: Diagnosis not present

## 2022-06-17 DIAGNOSIS — I129 Hypertensive chronic kidney disease with stage 1 through stage 4 chronic kidney disease, or unspecified chronic kidney disease: Secondary | ICD-10-CM | POA: Diagnosis not present

## 2022-06-17 DIAGNOSIS — Z7901 Long term (current) use of anticoagulants: Secondary | ICD-10-CM | POA: Diagnosis not present

## 2022-06-17 DIAGNOSIS — G2581 Restless legs syndrome: Secondary | ICD-10-CM | POA: Diagnosis not present

## 2022-06-17 DIAGNOSIS — N1831 Chronic kidney disease, stage 3a: Secondary | ICD-10-CM | POA: Diagnosis not present

## 2022-06-17 DIAGNOSIS — I872 Venous insufficiency (chronic) (peripheral): Secondary | ICD-10-CM | POA: Diagnosis not present

## 2022-06-17 DIAGNOSIS — D6869 Other thrombophilia: Secondary | ICD-10-CM | POA: Diagnosis not present

## 2022-06-17 DIAGNOSIS — D696 Thrombocytopenia, unspecified: Secondary | ICD-10-CM | POA: Diagnosis not present

## 2022-06-17 DIAGNOSIS — E871 Hypo-osmolality and hyponatremia: Secondary | ICD-10-CM | POA: Diagnosis not present

## 2022-06-17 DIAGNOSIS — M1990 Unspecified osteoarthritis, unspecified site: Secondary | ICD-10-CM | POA: Diagnosis not present

## 2022-06-17 DIAGNOSIS — I340 Nonrheumatic mitral (valve) insufficiency: Secondary | ICD-10-CM | POA: Diagnosis not present

## 2022-06-29 NOTE — H&P (Signed)
TOTAL KNEE ADMISSION H&P  Patient is being admitted for right total knee arthroplasty.  Subjective:  Chief Complaint: Right knee pain.  HPI: Nancy Baxter, 82 y.o. female has a history of pain and functional disability in the right knee due to arthritis and has failed non-surgical conservative treatments for greater than 12 weeks to include corticosteriod injections, viscosupplementation injections, and activity modification. Onset of symptoms was gradual, starting >10 years ago with gradually worsening course since that time. The patient noted no past surgery on the right knee.  Patient currently rates pain in the right knee at 8 out of 10 with activity. Patient has night pain, worsening of pain with activity and weight bearing, pain with passive range of motion, and crepitus. Patient has evidence of  bone-on-bone arthritis in the lateral and patellofemoral compartments with about a 10 degree valgus deformity in the right knee  by imaging studies. There is no active infection.  Patient Active Problem List   Diagnosis Date Noted   Nonrheumatic mitral valve regurgitation 01/13/2022   Nonrheumatic aortic valve insufficiency 01/13/2022   Stage 3b chronic kidney disease ([place]) 01/13/2022   Paroxysmal atrial fibrillation ([place]) 12/09/2021   PVCs (premature ventricular contractions) 12/08/2021   Elevated troponin 12/07/2021   Recurrent dislocation of left hip 08/06/2020   Essential hypertension 04/30/2020   Chronic kidney disease, stage 3a ([place]) 05/28/2019   Peripheral venous insufficiency 10/28/2014   Restless legs syndrome (RLS) 12/25/2012   Lumbosacral root lesions, not elsewhere classified 12/25/2012   Failed total hip arthroplasty ([place]) 01/05/2012   Gastro-esophageal reflux disease without esophagitis 07/08/2009   Benign paroxysmal positional vertigo 04/15/2009    Past Medical History:  Diagnosis Date   A-fib ([place])    Anemia    pt. denies   Arthritis    Complication of anesthesia     GERD (gastroesophageal reflux disease)    Heart murmur    evaluated 64yr ago-no cardiac follow up needed   Hypertension    PONV (postoperative nausea and vomiting)    Postop Acute blood loss anemia 01/06/2012   Postop Hyponatremia 01/06/2012   Postop Transfusion 01/07/2012   Restless leg     Past Surgical History:  Procedure Laterality Date   BACK SURGERY  1961   spinal fusion age    4 TUNNEL RELEASE Left 05/18/2016   Procedure: LEFT CARPAL TUNNEL RELEASE;  Surgeon: Daryll Brod, MD;  Location: [facility];  Service: Orthopedics;  Laterality: Left;   EAR CYST EXCISION  02/23/2011   pt. denies   FOOT ARTHRODESIS  5/12   hammer toes,bunionectomy   JOINT REPLACEMENT  2006   rt hip   JOINT REPLACEMENT  2008   lt hip   TONSILLECTOMY     age 24   TOTAL HIP REVISION  01/05/2012   Procedure: TOTAL HIP REVISION;  Surgeon: Gearlean Alf, MD;  Location: [facility];  Service: Orthopedics;  Laterality: Left;   TOTAL HIP REVISION Left 08/06/2020   Procedure: Left hip bearing surface revision;  Surgeon: Gaynelle Arabian, MD;  Location: [facility];  Service: Orthopedics;  Laterality: Left;  4min   TRIGGER FINGER RELEASE  02/23/2011   Procedure: RELEASE TRIGGER FINGER/A-1 PULLEY;  Surgeon: Wynonia Sours, MD;  Location: [facility];  Service: Orthopedics;  Laterality: Right;   TRIGGER FINGER RELEASE Left 07/26/2012   Procedure: RELEASE TRIGGER FINGER/A-1 PULLEY LEFT SMALL FINGER;  Surgeon: Wynonia Sours, MD;  Location: [facility];  Service: Orthopedics;  Laterality: Left;  TRIGGER FINGER RELEASE Left 05/18/2016   Procedure: RELEASE TRIGGER FINGER/A-1 PULLEY LEFT INDEX;  Surgeon: Daryll Brod, MD;  Location: [facility];  Service: Orthopedics;  Laterality: Left;   VAGINAL HYSTERECTOMY      Prior to Admission medications   Medication Sig Start Date End Date Taking? Authorizing Provider  acetaminophen (TYLENOL) 650 MG CR tablet Take 1,300 mg by mouth 2  (two) times daily.    [provider]  apixaban (ELIQUIS) 2.5 MG TABS tablet Take 1 tablet (2.5 mg total) by mouth 2 (two) times daily. 01/04/22   Buford Dresser, MD  cholecalciferol (VITAMIN D3) 25 MCG (1000 UNIT) tablet Take 1,000 Units by mouth daily.    [provider]  cyanocobalamin (VITAMIN B12) 1000 MCG tablet Take 2,000 mcg by mouth daily.    [provider]  diltiazem (CARDIZEM CD) 120 MG 24 hr capsule Take 1 capsule (120 mg total) by mouth daily. 05/14/22   Buford Dresser, MD  furosemide (LASIX) 20 MG tablet Take 40 mg by mouth in the morning. 02/25/15   [provider]  gabapentin (NEURONTIN) 100 MG capsule Take 200 mg by mouth at bedtime.    [provider]  losartan (COZAAR) 50 MG tablet Take 50 mg by mouth in the morning. 05/29/20   [provider]  rOPINIRole (REQUIP XL) 2 MG 24 hr tablet TAKE 1 TABLET BY MOUTH AT 6 IN THE EVENING AND 1 TABLET EVERY NIGHT AT BEDTIME Patient not taking: Reported on 05/14/2022 12/18/20   Dohmeier, Asencion Partridge, MD  zinc gluconate 50 MG tablet Take 50 mg by mouth daily.    [provider]    Allergies  Allergen Reactions   Sulfa Antibiotics Hives   Codeine Nausea And Vomiting   Prednisone Other (See Comments)    Shuts down adrenals   Zolpidem Tartrate Other (See Comments)    sleepwalks   Adhesive [Tape] Rash    blisters    Social History   Socioeconomic History   Marital status: Widowed    Spouse name: [name]    Number of children: 5   Years of education: College   Highest education level: Not on file  Occupational History    Comment: retired  Tobacco Use   Smoking status: Never   Smokeless tobacco: Never  Vaping Use   Vaping Use: Never used  Substance and Sexual Activity   Alcohol use: Yes    Alcohol/week: 1.0 standard drink of alcohol    Types: 1 Glasses of wine per week    Comment: one a day   Drug use: No   Sexual activity: Not Currently  Other Topics  Concern   Not on file  Social History Narrative   Patient is retired and married. [name])   Patient has some college education.    Patient has 5 children.    Right handed   Caffeine two cups of caffeine daily ( coffee)   Social Determinants of Health   Financial Resource Strain: Not on file  Food Insecurity: Not on file  Transportation Needs: Not on file  Physical Activity: Not on file  Stress: Not on file  Social Connections: Not on file  Intimate Partner Violence: Not on file    Tobacco Use: Low Risk  (06/16/2022)   Patient History    Smoking Tobacco Use: Never    Smokeless Tobacco Use: Never    Passive Exposure: Not on file   Social History   Substance and Sexual Activity  Alcohol Use Yes  Alcohol/week: 1.0 standard drink of alcohol   Types: 1 Glasses of wine per week   Comment: one a day    Family History  Problem Relation Age of Onset   Colon cancer Neg Hx    Esophageal cancer Neg Hx    Rectal cancer Neg Hx    Stomach cancer Neg Hx     Review of Systems  Constitutional:  Negative for chills and fever.  HENT:  Negative for congestion, sore throat and tinnitus.   Eyes:  Negative for double vision, photophobia and pain.  Respiratory:  Negative for cough, shortness of breath and wheezing.   Cardiovascular:  Negative for chest pain, palpitations and orthopnea.  Gastrointestinal:  Negative for heartburn, nausea and vomiting.  Genitourinary:  Negative for dysuria, frequency and urgency.  Musculoskeletal:  Positive for joint pain.  Neurological:  Negative for dizziness, weakness and headaches.    Objective:  Physical Exam: Well nourished and well developed.  General: Alert and oriented x3, cooperative and pleasant, no acute distress.  Head: normocephalic, atraumatic, neck supple.  Eyes: EOMI.  Musculoskeletal:  Right Knee Exam:  Significant valgus deformity.  Moderate effusion present.  The range of motion is: 0 to 125 degrees.  No crepitus on range of  motion of the knee.  Positive lateral greater than medial joint line tenderness.  The knee is stable.  Calves soft and nontender. Motor function intact in LE. Strength 5/5 LE bilaterally. Neuro: Distal pulses 2+. Sensation to light touch intact in LE.   Imaging Review Plain radiographs demonstrate severe degenerative joint disease of the right knee. The overall alignment is significant valgus. The bone quality appears to be adequate for age and reported activity level.  Assessment/Plan:  End stage arthritis, right knee   The patient history, physical examination, clinical judgment of the provider and imaging studies are consistent with end stage degenerative joint disease of the right knee and total knee arthroplasty is deemed medically necessary. The treatment options including medical management, injection therapy arthroscopy and arthroplasty were discussed at length. The risks and benefits of total knee arthroplasty were presented and reviewed. The risks due to aseptic loosening, infection, stiffness, patella tracking problems, thromboembolic complications and other imponderables were discussed. The patient acknowledged the explanation, agreed to proceed with the plan and consent was signed. Patient is being admitted for inpatient treatment for surgery, pain control, PT, OT, prophylactic antibiotics, VTE prophylaxis, progressive ambulation and ADLs and discharge planning. The patient is planning to be discharged  home .   Patient's anticipated LOS is less than 2 midnights, meeting these requirements: - Lives within 1 hour of care - Has a competent adult at home to recover with post-op recover - NO history of  - Chronic pain requiring opioids  - Diabetes  - Coronary Artery Disease  - Heart failure  - Heart attack  - Stroke  - DVT/VTE  - Respiratory Failure/COPD  - Renal failure  - Anemia  - Advanced Liver disease  Therapy Plans: Outpatient therapy at [facility] Disposition: Home with  friend Planned DVT Prophylaxis: Eliquis 2.5 mg BID DME Needed: None PCP: Shon Baton, MD (clearance received) Cardiologist: Buford Dresser, MD (last appt 2/24) TXA: IV Allergies: Codeine (N/V), corticosteroids, sulfa (hives) Anesthesia Concerns: Hx spinal fusion BMI: 24.4 Last HgbA1c: Not diabetic.  Ph[facility]: [facility] ([address])  Other:  - N/V with any pain meds containing codeine, discussed tramadol and a low dose of dilaudid postop - Two lesions on right shin distal to where incision will be.  Discussed with Dr. Wynelle Link after H&P.   - Patient was instructed on what medications to stop prior to surgery. - Follow-up visit in 2 weeks with Dr. Wynelle Link - Begin physical therapy following surgery - Pre-operative lab work as pre-surgical testing - Prescriptions will be provided in hospital at time of discharge  Theresa Duty, PA-C Orthopedic Surgery [facility] [place]

## 2022-07-06 DIAGNOSIS — M1711 Unilateral primary osteoarthritis, right knee: Secondary | ICD-10-CM | POA: Diagnosis not present

## 2022-07-06 DIAGNOSIS — M25661 Stiffness of right knee, not elsewhere classified: Secondary | ICD-10-CM | POA: Diagnosis not present

## 2022-07-06 DIAGNOSIS — M25561 Pain in right knee: Secondary | ICD-10-CM | POA: Diagnosis not present

## 2022-07-07 NOTE — Progress Notes (Signed)
COVID Vaccine Completed:  Date of COVID positive in last 90 days:  PCP - Shon Baton, MD Cardiologist - Buford Dresser, MD  Clearance by Shon Baton 04/23/22 on chart  Chest x-ray - 12/07/21 Epic EKG - 12/31/21 Epic Stress Test -  ECHO - 12/09/21 Epic Cardiac Cath -  Pacemaker/ICD device last checked: Spinal Cord Stimulator:  Bowel Prep -   Sleep Study -  CPAP -   Fasting Blood Sugar -  Checks Blood Sugar _____ times a day  Last dose of GLP1 agonist-  N/A GLP1 instructions:  N/A   Last dose of SGLT-2 inhibitors-  N/A SGLT-2 instructions: N/A   Blood Thinner Instructions: Eliquis Aspirin Instructions: Last Dose:  Activity level:  Can go up a flight of stairs and perform activities of daily living without stopping and without symptoms of chest pain or shortness of breath.  Able to exercise without symptoms  Unable to go up a flight of stairs without symptoms of     Anesthesia review: HTN, PVCs, a fib, MVR, CKD, palpitations  Patient denies shortness of breath, fever, cough and chest pain at PAT appointment  Patient verbalized understanding of instructions that were given to them at the PAT appointment. Patient was also instructed that they will need to review over the PAT instructions again at home before surgery.

## 2022-07-08 ENCOUNTER — Telehealth: Payer: Self-pay | Admitting: *Deleted

## 2022-07-08 ENCOUNTER — Other Ambulatory Visit: Payer: Self-pay

## 2022-07-08 ENCOUNTER — Encounter (HOSPITAL_COMMUNITY)
Admission: RE | Admit: 2022-07-08 | Discharge: 2022-07-08 | Disposition: A | Discharge status: Home / Self Care | Payer: PPO | Source: Ambulatory Visit | Attending: Orthopedic Surgery | Admitting: Orthopedic Surgery

## 2022-07-08 ENCOUNTER — Encounter (HOSPITAL_COMMUNITY): Payer: Self-pay

## 2022-07-08 VITALS — BP 165/80 | HR 70 | Temp 97.6°F | Resp 14 | Ht 58.5 in | Wt 122.0 lb

## 2022-07-08 DIAGNOSIS — Z01812 Encounter for preprocedural laboratory examination: Secondary | ICD-10-CM | POA: Diagnosis not present

## 2022-07-08 DIAGNOSIS — I480 Paroxysmal atrial fibrillation: Secondary | ICD-10-CM | POA: Diagnosis not present

## 2022-07-08 DIAGNOSIS — Z01818 Encounter for other preprocedural examination: Secondary | ICD-10-CM

## 2022-07-08 LAB — CBC
HCT: 41.7 % (ref 36.0–46.0)
Hemoglobin: 13.8 g/dL (ref 12.0–15.0)
MCH: 31.5 pg (ref 26.0–34.0)
MCHC: 33.1 g/dL (ref 30.0–36.0)
MCV: 95.2 fL (ref 80.0–100.0)
Platelets: 144 10*3/uL — ABNORMAL LOW (ref 150–400)
RBC: 4.38 MIL/uL (ref 3.87–5.11)
RDW: 13.1 % (ref 11.5–15.5)
WBC: 6.6 10*3/uL (ref 4.0–10.5)
nRBC: 0 % (ref 0.0–0.2)

## 2022-07-08 LAB — BASIC METABOLIC PANEL
Anion gap: 8 (ref 5–15)
BUN: 47 mg/dL — ABNORMAL HIGH (ref 8–23)
CO2: 26 mmol/L (ref 22–32)
Calcium: 9.3 mg/dL (ref 8.9–10.3)
Chloride: 104 mmol/L (ref 98–111)
Creatinine, Ser: 1.59 mg/dL — ABNORMAL HIGH (ref 0.44–1.00)
GFR, Estimated: 32 mL/min — ABNORMAL LOW (ref >60)
Glucose, Bld: 108 mg/dL — ABNORMAL HIGH (ref 70–99)
Potassium: 4.2 mmol/L (ref 3.5–5.1)
Sodium: 138 mmol/L (ref 135–145)

## 2022-07-08 LAB — SURGICAL PCR SCREEN
MRSA, PCR: NEGATIVE
Staphylococcus aureus: NEGATIVE

## 2022-07-08 NOTE — Patient Instructions (Addendum)
SURGICAL WAITING ROOM VISITATION  Patients having surgery or a procedure may have no more than 2 support people in the waiting area - these visitors may rotate.    Children under the age of 65 must have an adult with them who is not the patient.  Due to an increase in RSV and influenza rates and associated hospitalizations, children ages 59 and under may not visit patients in [place].  If the patient needs to stay at the hospital during part of their recovery, the visitor guidelines for inpatient rooms apply. Pre-op nurse will coordinate an appropriate time for 1 support person to accompany patient in pre-op.  This support person may not rotate.    Please refer to the [facility] website for the visitor guidelines for Inpatients (after your surgery is over and you are in a regular room).    Your procedure is scheduled on: 07/19/22   Report to [facility] Main Entrance    Report to admitting at 8:00 AM   Call this number if you have problems the morning of surgery [phone]   Do not eat food :After Midnight.   After Midnight you may have the following liquids until 7:30 AM DAY OF SURGERY  Water Non-Citrus Juices (without pulp, NO RED-Apple, White grape, White cranberry) Black Coffee (NO MILK/CREAM OR CREAMERS, sugar ok)  Clear Tea (NO MILK/CREAM OR CREAMERS, sugar ok) regular and decaf                             Plain Jell-O (NO RED)                                           Fruit ices (not with fruit pulp, NO RED)                                     Popsicles (NO RED)                                                               Sports drinks like Gatorade (NO RED)                 The day of surgery:  Drink ONE (1) Pre-Surgery Clear Ensure at 7:30 AM the morning of surgery. Drink in one sitting. Do not sip.  This drink was given to you during your hospital  pre-op appointment visit. Nothing else to drink after completing the  Pre-Surgery Clear Ensure.           If you have questions, please contact your surgeon's office.   FOLLOW BOWEL PREP AND ANY ADDITIONAL PRE OP INSTRUCTIONS YOU RECEIVED FROM YOUR SURGEON'S OFFICE!!!     Oral Hygiene is also important to reduce your risk of infection.                                    Remember - BRUSH YOUR TEETH THE MORNING OF SURGERY WITH YOUR REGULAR TOOTHPASTE  DENTURES WILL  BE REMOVED PRIOR TO SURGERY PLEASE DO NOT APPLY "Poly grip" OR ADHESIVES!!!   Take these medicines the morning of surgery with A SIP OF WATER: Tylenol  These are anesthesia recommendations for holding your anticoagulants.  Please contact your prescribing physician to confirm IF it is safe to hold your anticoagulants for this length of time.   Eliquis Apixaban   72 hours   Xarelto Rivaroxaban   72 hours  Plavix Clopidogrel   120 hours  Pletal Cilostazol   120 hours                                You may not have any metal on your body including hair pins, jewelry, and body piercing             Do not wear make-up, lotions, powders, perfumes, or deodorant  Do not wear nail polish including gel and S&S, artificial/acrylic nails, or any other type of covering on natural nails including finger and toenails. If you have artificial nails, gel coating, etc. that needs to be removed by a nail salon please have this removed prior to surgery or surgery may need to be canceled/ delayed if the surgeon/ anesthesia feels like they are unable to be safely monitored.   Do not shave  48 hours prior to surgery.    Do not bring valuables to the hospital. [place].   Contacts, glasses, dentures or bridgework may not be worn into surgery.   Bring small overnight bag day of surgery.   DO NOT [place]. PHARMACY WILL DISPENSE MEDICATIONS LISTED ON YOUR MEDICATION LIST TO YOU DURING YOUR ADMISSION [place]!              Please read over the  following fact sheets you were given: IF [place] [phone] Schneiders    If you received a COVID test during your pre-op visit  it is requested that you wear a mask when out in public, stay away from anyone that may not be feeling well and notify your surgeon if you develop symptoms. If you test positive for Covid or have been in contact with anyone that has tested positive in the last 10 days please notify you surgeon.    [facility] - Preparing for Surgery Before surgery, you can play an important role.  Because skin is not sterile, your skin needs to be as free of germs as possible.  You can reduce the number of germs on your skin by washing with CHG (chlorahexidine gluconate) soap before surgery.  CHG is an antiseptic cleaner which kills germs and bonds with the skin to continue killing germs even after washing. Please DO NOT use if you have an allergy to CHG or antibacterial soaps.  If your skin becomes reddened/irritated stop using the CHG and inform your nurse when you arrive at Short Stay. Do not shave (including legs and underarms) for at least 48 hours prior to the first CHG shower.  You may shave your face/neck.  Please follow these instructions carefully:  1.  Shower with CHG Soap the night before surgery and the  morning of surgery.  2.  If you choose to wash your hair, wash your hair first as usual with your normal  shampoo.  3.  After you shampoo, rinse your hair and body thoroughly to remove the shampoo.                             4.  Use CHG as you would any other liquid soap.  You can apply chg directly to the skin and wash.  Gently with a scrungie or clean washcloth.  5.  Apply the CHG Soap to your body ONLY FROM THE NECK DOWN.   Do   not use on face/ open                           Wound or open sores. Avoid contact with eyes, ears mouth and   genitals (private parts).                       Wash face,  Genitals (private parts)  with your normal soap.             6.  Wash thoroughly, paying special attention to the area where your    surgery  will be performed.  7.  Thoroughly rinse your body with warm water from the neck down.  8.  DO NOT shower/wash with your normal soap after using and rinsing off the CHG Soap.                9.  Pat yourself dry with a clean towel.            10.  Wear clean pajamas.            11.  Place clean sheets on your bed the night of your first shower and do not  sleep with pets. Day of Surgery : Do not apply any lotions/deodorants the morning of surgery.  Please wear clean clothes to the hospital/surgery center.  FAILURE TO FOLLOW THESE INSTRUCTIONS MAY RESULT IN THE CANCELLATION OF YOUR SURGERY  PATIENT SIGNATURE_________________________________  NURSE SIGNATURE__________________________________  ________________________________________________________________________  Adam Phenix  An incentive spirometer is a tool that can help keep your lungs clear and active. This tool measures how well you are filling your lungs with each breath. Taking long deep breaths may help reverse or decrease the chance of developing breathing (pulmonary) problems (especially infection) following: A long period of time when you are unable to move or be active. BEFORE THE PROCEDURE  If the spirometer includes an indicator to show your best effort, your nurse or respiratory therapist will set it to a desired goal. If possible, sit up straight or lean slightly forward. Try not to slouch. Hold the incentive spirometer in an upright position. INSTRUCTIONS FOR USE  Sit on the edge of your bed if possible, or sit up as far as you can in bed or on a chair. Hold the incentive spirometer in an upright position. Breathe out normally. Place the mouthpiece in your mouth and seal your lips tightly around it. Breathe in slowly and as deeply as possible, raising the piston or the ball toward the top of the  column. Hold your breath for 3-5 seconds or for as long as possible. Allow the piston or ball to fall to the bottom of the column. Remove the mouthpiece from your mouth and breathe out normally. Rest for a few seconds and repeat Steps 1 through 7 at least 10 times every 1-2 hours when you are awake. Take your time  and take a few normal breaths between deep breaths. The spirometer may include an indicator to show your best effort. Use the indicator as a goal to work toward during each repetition. After each set of 10 deep breaths, practice coughing to be sure your lungs are clear. If you have an incision (the cut made at the time of surgery), support your incision when coughing by placing a pillow or rolled up towels firmly against it. Once you are able to get out of bed, walk around indoors and cough well. You may stop using the incentive spirometer when instructed by your caregiver.  RISKS AND COMPLICATIONS Take your time so you do not get dizzy or light-headed. If you are in pain, you may need to take or ask for pain medication before doing incentive spirometry. It is harder to take a deep breath if you are having pain. AFTER USE Rest and breathe slowly and easily. It can be helpful to keep track of a log of your progress. Your caregiver can provide you with a simple table to help with this. If you are using the spirometer at home, follow these instructions: [place] IF:  You are having difficultly using the spirometer. You have trouble using the spirometer as often as instructed. Your pain medication is not giving enough relief while using the spirometer. You develop fever of 100.5 F (38.1 C) or higher. SEEK IMMEDIATE MEDICAL CARE IF:  You cough up bloody sputum that had not been present before. You develop fever of 102 F (38.9 C) or greater. You develop worsening pain at or near the incision site. MAKE SURE YOU:  Understand these instructions. Will watch your  condition. Will get help right away if you are not doing well or get worse. Document Released: 08/09/2006 Document Revised: 06/21/2011 Document Reviewed: 10/10/2006 [facility] Patient Information 2014 [place], [place].   ________________________________________________________________________

## 2022-07-08 NOTE — Telephone Encounter (Signed)
   Pre-operative Risk Assessment    Patient Name: Nancy Baxter  DOB: 04-15-1940 MRN: [id number]      Request for Surgical Clearance    Procedure:   RIGHT TOTAL KNEE ARTHROPLASTY  Date of Surgery:  Clearance 07/19/22                                 Surgeon:  DR. Gaynelle Arabian Surgeon's Group or Practice Name:  [facility] Phone number:  [phone] ATTN: [place] Fax number:  [phone]   Type of Clearance Requested:   - Medical  - Pharmacy:  Hold Apixaban (Eliquis)     Type of Anesthesia:   CHOICE   Additional requests/questions:    Jiles Prows   07/08/2022, 3:28 PM

## 2022-07-09 NOTE — Telephone Encounter (Signed)
Patient with diagnosis of afib on Eliquis for anticoagulation.    Procedure: right TKA Date of procedure: 07/19/22  CHA2DS2-VASc Score = 4  This indicates a 4.8% annual risk of stroke. The patient's score is based upon: CHF History: 0 HTN History: 1 Diabetes History: 0 Stroke History: 0 Vascular Disease History: 0 Age Score: 2 Gender Score: 1  CrCl 12mL/min Platelet count 144K  Per office protocol, patient can hold Eliquis for 3 days prior to procedure.    **This guidance is not considered finalized until pre-operative APP has relayed final recommendations.**

## 2022-07-14 ENCOUNTER — Encounter (HOSPITAL_BASED_OUTPATIENT_CLINIC_OR_DEPARTMENT_OTHER): Payer: Self-pay

## 2022-07-14 NOTE — Telephone Encounter (Signed)
Call placed today to review instructions for holding Eliquis and to see if patient has had any changes since previous visit with Dr. Harrell Gave.  She was unavailable to talk at this time and left detailed message on her voicemail  to call us back at her earliest convenience.  Ambrose Pancoast, NP

## 2022-07-14 NOTE — Telephone Encounter (Signed)
   Patient Name: Nancy Baxter  DOB: Aug 31, 1940 MRN: [id number]  Primary Cardiologist: Buford Dresser, MD  Chart reviewed as part of pre-operative protocol coverage. Given past medical history and time since last visit, based on ACC/AHA guidelines, TSERING LAMPKIN is at acceptable risk for the planned procedure without further cardiovascular testing.  She is able to complete 4 METS of activity and reports no new cardiac complaints.  Her current RCRI score is 0.9% placing her at low risk for major cardiac events.  The patient was advised that if she develops new symptoms prior to surgery to contact our office to arrange for a follow-up visit, and she verbalized understanding.  Per office protocol, patient can hold Eliquis for 3 days prior to procedure.    I will route this recommendation to the requesting party via Epic fax function and remove from pre-op pool.  Please call with questions.  Mable Fill, Marissa Nestle, NP 07/14/2022, 9:56 AM

## 2022-07-15 NOTE — Anesthesia Preprocedure Evaluation (Addendum)
Anesthesia Evaluation  Patient identified by MRN, date of birth, ID band Patient awake    Reviewed: Allergy & Precautions, NPO status , Patient's Chart, lab work & pertinent test results  History of Anesthesia Complications Negative for: history of anesthetic complications  Airway Mallampati: II  TM Distance: >3 FB Neck ROM: Full    Dental no notable dental hx.    Pulmonary neg pulmonary ROS   Pulmonary exam normal        Cardiovascular hypertension, Pt. on medications and Pt. on home beta blockers Normal cardiovascular exam+ dysrhythmias (on Eliquis) Atrial Fibrillation   TTE 11/2021: EF 60-65%, moderate LAE, mild to moderate MR, moderate AR   Neuro/Psych BPPV, RLS    GI/Hepatic Neg liver ROS,GERD  ,,  Endo/Other  negative endocrine ROS    Renal/GU Renal InsufficiencyRenal disease (Cr 1.59)  negative genitourinary   Musculoskeletal negative musculoskeletal ROS (+)    Abdominal   Peds  Hematology negative hematology ROS (+)   Anesthesia Other Findings Day of surgery medications reviewed with patient.  Reproductive/Obstetrics negative OB ROS                              Anesthesia Physical Anesthesia Plan  ASA: 2  Anesthesia Plan: Spinal   Post-op Pain Management: Ofirmev IV (intra-op)* and Regional block*   Induction:   PONV Risk Score and Plan: 3 and Treatment may vary due to age or medical condition, Ondansetron, Propofol infusion, Dexamethasone and TIVA  Airway Management Planned: Natural Airway and Simple Face Mask  Additional Equipment: None  Intra-op Plan:   Post-operative Plan:   Informed Consent: I have reviewed the patients History and Physical, chart, labs and discussed the procedure including the risks, benefits and alternatives for the proposed anesthesia with the patient or authorized representative who has indicated his/her understanding and acceptance.        Plan Discussed with: CRNA  Anesthesia Plan Comments: (See PAT note 07/08/2022)        Anesthesia Quick Evaluation

## 2022-07-15 NOTE — Telephone Encounter (Signed)
Responded to patient in separate encounter

## 2022-07-15 NOTE — Telephone Encounter (Signed)
BP log for prior to surgery as instructed by Ambrose Pancoast, NP

## 2022-07-15 NOTE — Progress Notes (Signed)
Anesthesia Chart Review   Case: [id number] Date/Time: 07/19/22 1015   Procedure: TOTAL KNEE ARTHROPLASTY (Right: Knee)   Anesthesia type: Choice   Pre-op diagnosis: right knee osteoarthritis   Location: [place] [facility] / [facility]   Surgeons: Gaynelle Arabian, MD       DISCUSSION:82 y.o. never smoker with h/o PONV, HTN, a-fib, right knee OA scheduled for above procedure 07/19/2022 with Dr. Gaynelle Arabian.   Per cardiology preoperative evaluation 07/13/2021, "Chart reviewed as part of pre-operative protocol coverage. Given past medical history and time since last visit, based on ACC/AHA guidelines, Nancy Baxter is at acceptable risk for the planned procedure without further cardiovascular testing.  She is able to complete 4 METS of activity and reports no new cardiac complaints.  Her current RCRI score is 0.9% placing her at low risk for major cardiac events.   The patient was advised that if she develops new symptoms prior to surgery to contact our office to arrange for a follow-up visit, and she verbalized understanding.   Per office protocol, patient can hold Eliquis for 3 days prior to procedure."  Anticipate pt can proceed with planned procedure barring acute status change.   VS: BP (!) 165/80   Pulse 70   Temp 36.4 C (Oral)   Resp 14   Ht 4' 10.5" (1.486 m)   Wt 55.3 kg   SpO2 100%   BMI 25.06 kg/m   PROVIDERS: Shon Baton, MD is PCP   Cardiologist - Buford Dresser, MD  LABS: Labs reviewed: Acceptable for surgery. (all labs ordered are listed, but only abnormal results are displayed)  Labs Reviewed  BASIC METABOLIC PANEL - Abnormal; Notable for the following components:      Result Value   Glucose, Bld 108 (*)    BUN 47 (*)    Creatinine, Ser 1.59 (*)    GFR, Estimated 32 (*)    All other components within normal limits  CBC - Abnormal; Notable for the following components:   Platelets 144 (*)    All other components within normal limits  SURGICAL PCR SCREEN      IMAGES:   EKG:   CV: Echo 12/09/2021 1. Left ventricular ejection fraction, by estimation, is 60 to 65%. The  left ventricle has normal function. The left ventricle has no regional  wall motion abnormalities. Left ventricular diastolic parameters were  normal.   2. Right ventricular systolic function is normal. The right ventricular  size is normal. Tricuspid regurgitation signal is inadequate for assessing  PA pressure.   3. Left atrial size was moderately dilated.   4. The mitral valve is normal in structure. Mild to moderate mitral valve  regurgitation.   5. The aortic valve is tricuspid. There is mild calcification of the  aortic valve. There is mild thickening of the aortic valve. Aortic valve  regurgitation is moderate. Aortic valve sclerosis is present, with no  evidence of aortic valve stenosis.   6. There is borderline dilatation of the ascending aorta, measuring 37  mm.   7. The inferior vena cava is dilated in size with >50% respiratory  variability, suggesting right atrial pressure of 8 mmHg.   Past Medical History:  Diagnosis Date   A-fib ([place])    Anemia    pt. denies   Arthritis    Complication of anesthesia    GERD (gastroesophageal reflux disease)    Heart murmur    evaluated 42yr ago-no cardiac follow up needed   Hypertension  PONV (postoperative nausea and vomiting)    Postop Acute blood loss anemia 01/06/2012   Postop Hyponatremia 01/06/2012   Postop Transfusion 01/07/2012   Restless leg     Past Surgical History:  Procedure Laterality Date   BACK SURGERY  1961   spinal fusion age    75 TUNNEL RELEASE Left 05/18/2016   Procedure: LEFT CARPAL TUNNEL RELEASE;  Surgeon: Daryll Brod, MD;  Location: [place];  Service: Orthopedics;  Laterality: Left;   EAR CYST EXCISION  02/23/2011   pt. denies   FOOT ARTHRODESIS  5/12   hammer toes,bunionectomy   JOINT REPLACEMENT  2006   rt hip   JOINT REPLACEMENT  2008   lt hip    TONSILLECTOMY     age 87   TOTAL HIP REVISION  01/05/2012   Procedure: TOTAL HIP REVISION;  Surgeon: Gearlean Alf, MD;  Location: [facility];  Service: Orthopedics;  Laterality: Left;   TOTAL HIP REVISION Left 08/06/2020   Procedure: Left hip bearing surface revision;  Surgeon: Gaynelle Arabian, MD;  Location: [facility];  Service: Orthopedics;  Laterality: Left;  69min   TRIGGER FINGER RELEASE  02/23/2011   Procedure: RELEASE TRIGGER FINGER/A-1 PULLEY;  Surgeon: Wynonia Sours, MD;  Location: [place];  Service: Orthopedics;  Laterality: Right;   TRIGGER FINGER RELEASE Left 07/26/2012   Procedure: RELEASE TRIGGER FINGER/A-1 PULLEY LEFT SMALL FINGER;  Surgeon: Wynonia Sours, MD;  Location: [place];  Service: Orthopedics;  Laterality: Left;   TRIGGER FINGER RELEASE Left 05/18/2016   Procedure: RELEASE TRIGGER FINGER/A-1 PULLEY LEFT INDEX;  Surgeon: Daryll Brod, MD;  Location: [place];  Service: Orthopedics;  Laterality: Left;   VAGINAL HYSTERECTOMY      MEDICATIONS:  acetaminophen (TYLENOL) 650 MG CR tablet   apixaban (ELIQUIS) 2.5 MG TABS tablet   cholecalciferol (VITAMIN D3) 25 MCG (1000 UNIT) tablet   cyanocobalamin (VITAMIN B12) 1000 MCG tablet   ferrous sulfate 325 (65 FE) MG tablet   furosemide (LASIX) 20 MG tablet   gabapentin (NEURONTIN) 100 MG capsule   losartan (COZAAR) 100 MG tablet   trolamine salicylate (ASPERCREME) 10 % cream   zinc gluconate 50 MG tablet   No current facility-administered medications for this encounter.     Nancy Baxter Ward, PA-C [facility] [phone]

## 2022-07-19 ENCOUNTER — Other Ambulatory Visit: Payer: Self-pay

## 2022-07-19 ENCOUNTER — Encounter (HOSPITAL_COMMUNITY)
Admission: RE | Disposition: A | Discharge status: Skilled Nursing Facility | Payer: Self-pay | Source: Home / Self Care | Attending: Orthopedic Surgery

## 2022-07-19 ENCOUNTER — Ambulatory Visit (HOSPITAL_COMMUNITY): Payer: PPO | Admitting: Physician Assistant

## 2022-07-19 ENCOUNTER — Encounter (HOSPITAL_COMMUNITY): Payer: Self-pay | Admitting: Orthopedic Surgery

## 2022-07-19 ENCOUNTER — Observation Stay (HOSPITAL_COMMUNITY)
Admission: RE | Admit: 2022-07-19 | Discharge: 2022-07-20 | Disposition: A | Discharge status: Skilled Nursing Facility | Payer: PPO | Attending: Orthopedic Surgery | Admitting: Orthopedic Surgery

## 2022-07-19 ENCOUNTER — Ambulatory Visit (HOSPITAL_BASED_OUTPATIENT_CLINIC_OR_DEPARTMENT_OTHER): Payer: PPO | Admitting: Certified Registered Nurse Anesthetist

## 2022-07-19 DIAGNOSIS — Z96643 Presence of artificial hip joint, bilateral: Secondary | ICD-10-CM | POA: Diagnosis not present

## 2022-07-19 DIAGNOSIS — Z79899 Other long term (current) drug therapy: Secondary | ICD-10-CM | POA: Diagnosis not present

## 2022-07-19 DIAGNOSIS — I4891 Unspecified atrial fibrillation: Secondary | ICD-10-CM | POA: Diagnosis not present

## 2022-07-19 DIAGNOSIS — M1711 Unilateral primary osteoarthritis, right knee: Secondary | ICD-10-CM

## 2022-07-19 DIAGNOSIS — M179 Osteoarthritis of knee, unspecified: Secondary | ICD-10-CM | POA: Diagnosis present

## 2022-07-19 DIAGNOSIS — N1832 Chronic kidney disease, stage 3b: Secondary | ICD-10-CM | POA: Diagnosis not present

## 2022-07-19 DIAGNOSIS — I129 Hypertensive chronic kidney disease with stage 1 through stage 4 chronic kidney disease, or unspecified chronic kidney disease: Secondary | ICD-10-CM | POA: Insufficient documentation

## 2022-07-19 DIAGNOSIS — G8918 Other acute postprocedural pain: Secondary | ICD-10-CM | POA: Diagnosis not present

## 2022-07-19 DIAGNOSIS — Z7901 Long term (current) use of anticoagulants: Secondary | ICD-10-CM | POA: Diagnosis not present

## 2022-07-19 HISTORY — PX: TOTAL KNEE ARTHROPLASTY: SHX125

## 2022-07-19 SURGERY — ARTHROPLASTY, KNEE, TOTAL
Anesthesia: Spinal | Site: Knee | Laterality: Right

## 2022-07-19 MED ORDER — OXYCODONE HCL 5 MG/5ML PO SOLN: 5.0000 mg | Freq: Once | ORAL | Status: DC | PRN

## 2022-07-19 MED ORDER — FENTANYL CITRATE (PF) 100 MCG/2ML IJ SOLN
INTRAMUSCULAR | Status: DC | PRN
  Administered 2022-07-19 (×2): 50 ug via INTRAVENOUS

## 2022-07-19 MED ORDER — SODIUM CHLORIDE (PF) 0.9 % IJ SOLN
INTRAMUSCULAR | Status: AC
  Filled 2022-07-19: qty 50

## 2022-07-19 MED ORDER — PROPOFOL 10 MG/ML IV BOLUS
INTRAVENOUS | Status: AC
  Filled 2022-07-19: qty 20

## 2022-07-19 MED ORDER — APIXABAN 2.5 MG PO TABS
2.5000 mg | ORAL_TABLET | Freq: Two times a day (BID) | ORAL | Status: DC
  Administered 2022-07-20: 2.5 mg via ORAL
  Filled 2022-07-19: qty 1

## 2022-07-19 MED ORDER — PHENOL 1.4 % MT LIQD: 1.0000 | OROMUCOSAL | Status: DC | PRN

## 2022-07-19 MED ORDER — ONDANSETRON HCL 4 MG/2ML IJ SOLN
INTRAMUSCULAR | Status: DC | PRN
  Administered 2022-07-19: 4 mg via INTRAVENOUS

## 2022-07-19 MED ORDER — STERILE WATER FOR IRRIGATION IR SOLN
Status: DC | PRN
  Administered 2022-07-19: 2000 mL

## 2022-07-19 MED ORDER — 0.9 % SODIUM CHLORIDE (POUR BTL) OPTIME
TOPICAL | Status: DC | PRN
  Administered 2022-07-19: 1000 mL

## 2022-07-19 MED ORDER — SODIUM CHLORIDE 0.9 % IV SOLN: INTRAVENOUS | Status: DC

## 2022-07-19 MED ORDER — EPHEDRINE SULFATE-NACL 50-0.9 MG/10ML-% IV SOSY
PREFILLED_SYRINGE | INTRAVENOUS | Status: DC | PRN
  Administered 2022-07-19: 5 mg via INTRAVENOUS

## 2022-07-19 MED ORDER — BUPIVACAINE LIPOSOME 1.3 % IJ SUSP
INTRAMUSCULAR | Status: AC
  Filled 2022-07-19: qty 20

## 2022-07-19 MED ORDER — ONDANSETRON HCL 4 MG PO TABS: 4.0000 mg | ORAL_TABLET | Freq: Four times a day (QID) | ORAL | Status: DC | PRN

## 2022-07-19 MED ORDER — FENTANYL CITRATE (PF) 100 MCG/2ML IJ SOLN
INTRAMUSCULAR | Status: AC
  Filled 2022-07-19: qty 2

## 2022-07-19 MED ORDER — PROMETHAZINE HCL 25 MG/ML IJ SOLN: 6.2500 mg | INTRAMUSCULAR | Status: DC | PRN

## 2022-07-19 MED ORDER — CEFAZOLIN SODIUM-DEXTROSE 2-4 GM/100ML-% IV SOLN
2.0000 g | INTRAVENOUS | Status: AC
  Administered 2022-07-19: 2 g via INTRAVENOUS
  Filled 2022-07-19: qty 100

## 2022-07-19 MED ORDER — SODIUM CHLORIDE 0.9 % IR SOLN
Status: DC | PRN
  Administered 2022-07-19: 1000 mL

## 2022-07-19 MED ORDER — OXYCODONE HCL 5 MG PO TABS: 5.0000 mg | ORAL_TABLET | Freq: Once | ORAL | Status: DC | PRN

## 2022-07-19 MED ORDER — DEXAMETHASONE SODIUM PHOSPHATE 10 MG/ML IJ SOLN
INTRAMUSCULAR | Status: AC
  Filled 2022-07-19: qty 1

## 2022-07-19 MED ORDER — LACTATED RINGERS IV SOLN: INTRAVENOUS | Status: DC

## 2022-07-19 MED ORDER — SODIUM CHLORIDE (PF) 0.9 % IJ SOLN
INTRAMUSCULAR | Status: DC | PRN
  Administered 2022-07-19: 60 mL

## 2022-07-19 MED ORDER — BUPIVACAINE LIPOSOME 1.3 % IJ SUSP
INTRAMUSCULAR | Status: DC | PRN
  Administered 2022-07-19: 20 mL

## 2022-07-19 MED ORDER — LOSARTAN POTASSIUM 50 MG PO TABS
100.0000 mg | ORAL_TABLET | Freq: Every day | ORAL | Status: DC
  Administered 2022-07-20: 100 mg via ORAL
  Filled 2022-07-19: qty 2

## 2022-07-19 MED ORDER — DEXAMETHASONE SODIUM PHOSPHATE 10 MG/ML IJ SOLN
INTRAMUSCULAR | Status: DC | PRN
  Administered 2022-07-19: 8 mg via INTRAVENOUS

## 2022-07-19 MED ORDER — ACETAMINOPHEN 10 MG/ML IV SOLN
1000.0000 mg | Freq: Four times a day (QID) | INTRAVENOUS | Status: DC
  Administered 2022-07-19: 1000 mg via INTRAVENOUS
  Filled 2022-07-19: qty 100

## 2022-07-19 MED ORDER — ONDANSETRON HCL 4 MG/2ML IJ SOLN: 4.0000 mg | Freq: Four times a day (QID) | INTRAMUSCULAR | Status: DC | PRN

## 2022-07-19 MED ORDER — CHLORHEXIDINE GLUCONATE 0.12 % MT SOLN
15.0000 mL | Freq: Once | OROMUCOSAL | Status: AC
  Administered 2022-07-19: 15 mL via OROMUCOSAL

## 2022-07-19 MED ORDER — METOPROLOL TARTRATE 25 MG PO TABS
25.0000 mg | ORAL_TABLET | Freq: Every day | ORAL | Status: DC
  Administered 2022-07-20: 25 mg via ORAL
  Filled 2022-07-19: qty 1

## 2022-07-19 MED ORDER — FENTANYL CITRATE PF 50 MCG/ML IJ SOSY: 25.0000 ug | PREFILLED_SYRINGE | INTRAMUSCULAR | Status: DC | PRN

## 2022-07-19 MED ORDER — TRANEXAMIC ACID-NACL 1000-0.7 MG/100ML-% IV SOLN
1000.0000 mg | INTRAVENOUS | Status: AC
  Administered 2022-07-19: 1000 mg via INTRAVENOUS
  Filled 2022-07-19: qty 100

## 2022-07-19 MED ORDER — FERROUS SULFATE 325 (65 FE) MG PO TABS
325.0000 mg | ORAL_TABLET | ORAL | Status: DC
  Administered 2022-07-20: 325 mg via ORAL
  Filled 2022-07-19: qty 1

## 2022-07-19 MED ORDER — BUPIVACAINE-EPINEPHRINE (PF) 0.5% -1:200000 IJ SOLN
INTRAMUSCULAR | Status: DC | PRN
  Administered 2022-07-19: 15 mL via PERINEURAL

## 2022-07-19 MED ORDER — ORAL CARE MOUTH RINSE: 15.0000 mL | OROMUCOSAL | Status: DC | PRN

## 2022-07-19 MED ORDER — FUROSEMIDE 40 MG PO TABS
40.0000 mg | ORAL_TABLET | Freq: Every day | ORAL | Status: DC
  Administered 2022-07-19 – 2022-07-20 (×2): 40 mg via ORAL
  Filled 2022-07-19 (×2): qty 1

## 2022-07-19 MED ORDER — POLYETHYLENE GLYCOL 3350 17 G PO PACK: 17.0000 g | PACK | Freq: Every day | ORAL | Status: DC | PRN

## 2022-07-19 MED ORDER — METHOCARBAMOL 500 MG PO TABS
500.0000 mg | ORAL_TABLET | Freq: Four times a day (QID) | ORAL | Status: DC | PRN
  Administered 2022-07-19 – 2022-07-20 (×4): 500 mg via ORAL
  Filled 2022-07-19 (×4): qty 1

## 2022-07-19 MED ORDER — POVIDONE-IODINE 10 % EX SWAB: 2.0000 | Freq: Once | CUTANEOUS | Status: AC

## 2022-07-19 MED ORDER — METHOCARBAMOL 500 MG IVPB - SIMPLE MED: 500.0000 mg | Freq: Four times a day (QID) | INTRAVENOUS | Status: DC | PRN

## 2022-07-19 MED ORDER — BISACODYL 10 MG RE SUPP: 10.0000 mg | Freq: Every day | RECTAL | Status: DC | PRN

## 2022-07-19 MED ORDER — LIDOCAINE HCL (PF) 2 % IJ SOLN
INTRAMUSCULAR | Status: AC
  Filled 2022-07-19: qty 5

## 2022-07-19 MED ORDER — ACETAMINOPHEN 500 MG PO TABS
1000.0000 mg | ORAL_TABLET | Freq: Four times a day (QID) | ORAL | Status: AC
  Administered 2022-07-19 – 2022-07-20 (×4): 1000 mg via ORAL
  Filled 2022-07-19 (×4): qty 2

## 2022-07-19 MED ORDER — METOCLOPRAMIDE HCL 5 MG PO TABS: 5.0000 mg | ORAL_TABLET | Freq: Three times a day (TID) | ORAL | Status: DC | PRN

## 2022-07-19 MED ORDER — PROPOFOL 10 MG/ML IV BOLUS
INTRAVENOUS | Status: DC | PRN
  Administered 2022-07-19 (×2): 10 mg via INTRAVENOUS
  Administered 2022-07-19 (×2): 20 mg via INTRAVENOUS
  Administered 2022-07-19: 10 mg via INTRAVENOUS

## 2022-07-19 MED ORDER — METOCLOPRAMIDE HCL 5 MG/ML IJ SOLN: 5.0000 mg | Freq: Three times a day (TID) | INTRAMUSCULAR | Status: DC | PRN

## 2022-07-19 MED ORDER — BUPIVACAINE LIPOSOME 1.3 % IJ SUSP: 20.0000 mL | Freq: Once | INTRAMUSCULAR | Status: AC

## 2022-07-19 MED ORDER — CEFAZOLIN SODIUM-DEXTROSE 2-4 GM/100ML-% IV SOLN
2.0000 g | Freq: Four times a day (QID) | INTRAVENOUS | Status: AC
  Administered 2022-07-19 (×2): 2 g via INTRAVENOUS
  Filled 2022-07-19 (×2): qty 100

## 2022-07-19 MED ORDER — PHENYLEPHRINE HCL-NACL 20-0.9 MG/250ML-% IV SOLN
INTRAVENOUS | Status: DC | PRN
  Administered 2022-07-19: 20 ug/min via INTRAVENOUS

## 2022-07-19 MED ORDER — TRAMADOL HCL 50 MG PO TABS
50.0000 mg | ORAL_TABLET | Freq: Four times a day (QID) | ORAL | Status: DC | PRN
  Administered 2022-07-19 – 2022-07-20 (×3): 100 mg via ORAL
  Filled 2022-07-19 (×4): qty 2

## 2022-07-19 MED ORDER — CLONIDINE HCL (ANALGESIA) 100 MCG/ML EP SOLN
EPIDURAL | Status: DC | PRN
  Administered 2022-07-19: 100 ug

## 2022-07-19 MED ORDER — MENTHOL 3 MG MT LOZG: 1.0000 | LOZENGE | OROMUCOSAL | Status: DC | PRN

## 2022-07-19 MED ORDER — HYDROMORPHONE HCL 1 MG/ML IJ SOLN: 0.5000 mg | INTRAMUSCULAR | Status: DC | PRN

## 2022-07-19 MED ORDER — FLEET ENEMA 7-19 GM/118ML RE ENEM: 1.0000 | ENEMA | Freq: Once | RECTAL | Status: DC | PRN

## 2022-07-19 MED ORDER — PROPOFOL 500 MG/50ML IV EMUL
INTRAVENOUS | Status: DC | PRN
  Administered 2022-07-19: 50 ug/kg/min via INTRAVENOUS

## 2022-07-19 MED ORDER — HYDROMORPHONE HCL 2 MG PO TABS: 1.0000 mg | ORAL_TABLET | ORAL | Status: DC | PRN

## 2022-07-19 MED ORDER — BUPIVACAINE IN DEXTROSE 0.75-8.25 % IT SOLN
INTRATHECAL | Status: DC | PRN
  Administered 2022-07-19: 1.4 mL via INTRATHECAL

## 2022-07-19 MED ORDER — FENTANYL CITRATE PF 50 MCG/ML IJ SOSY
50.0000 ug | PREFILLED_SYRINGE | INTRAMUSCULAR | Status: DC
  Administered 2022-07-19: 50 ug via INTRAVENOUS
  Filled 2022-07-19: qty 2

## 2022-07-19 MED ORDER — DIPHENHYDRAMINE HCL 12.5 MG/5ML PO ELIX: 12.5000 mg | ORAL_SOLUTION | ORAL | Status: DC | PRN

## 2022-07-19 MED ORDER — ORAL CARE MOUTH RINSE: 15.0000 mL | Freq: Once | OROMUCOSAL | Status: AC

## 2022-07-19 MED ORDER — GABAPENTIN 100 MG PO CAPS
200.0000 mg | ORAL_CAPSULE | Freq: Every day | ORAL | Status: DC
  Administered 2022-07-19: 200 mg via ORAL
  Filled 2022-07-19: qty 2

## 2022-07-19 MED ORDER — SODIUM CHLORIDE (PF) 0.9 % IJ SOLN
INTRAMUSCULAR | Status: AC
  Filled 2022-07-19: qty 10

## 2022-07-19 MED ORDER — DOCUSATE SODIUM 100 MG PO CAPS
100.0000 mg | ORAL_CAPSULE | Freq: Two times a day (BID) | ORAL | Status: DC
  Administered 2022-07-19 – 2022-07-20 (×2): 100 mg via ORAL
  Filled 2022-07-19 (×2): qty 1

## 2022-07-19 MED ORDER — PROPOFOL 1000 MG/100ML IV EMUL
INTRAVENOUS | Status: AC
  Filled 2022-07-19: qty 100

## 2022-07-19 MED ORDER — ONDANSETRON HCL 4 MG/2ML IJ SOLN
INTRAMUSCULAR | Status: AC
  Filled 2022-07-19: qty 2

## 2022-07-19 SURGICAL SUPPLY — 58 items
ATTUNE PSFEM RTSZ5 NARCEM KNEE (Femur) IMPLANT
ATTUNE PSRP INSR SZ5 8 KNEE (Insert) IMPLANT
BAG COUNTER SPONGE SURGICOUNT (BAG) IMPLANT
BAG SPEC THK2 15X12 ZIP CLS (MISCELLANEOUS) ×1
BAG SPNG CNTER NS LX DISP (BAG) ×1
BAG ZIPLOCK 12X15 (MISCELLANEOUS) ×2 IMPLANT
BASE TIBIAL ROT PLAT SZ 5 KNEE (Knees) IMPLANT
BLADE SAG 18X100X1.27 (BLADE) ×2 IMPLANT
BLADE SAW SGTL 11.0X1.19X90.0M (BLADE) ×2 IMPLANT
BNDG CMPR 5X62 HK CLSR LF (GAUZE/BANDAGES/DRESSINGS) ×1
BNDG CMPR 6"X 5 YARDS HK CLSR (GAUZE/BANDAGES/DRESSINGS) ×1
BNDG ELASTIC 6INX 5YD STR LF (GAUZE/BANDAGES/DRESSINGS) ×2 IMPLANT
BOWL SMART MIX CTS (DISPOSABLE) ×2 IMPLANT
BSPLAT TIB 5 CMNT ROT PLAT STR (Knees) ×1 IMPLANT
CEMENT HV SMART SET (Cement) ×4 IMPLANT
COVER SURGICAL LIGHT HANDLE (MISCELLANEOUS) ×2 IMPLANT
CUFF TOURN SGL QUICK 34 (TOURNIQUET CUFF) ×1
CUFF TRNQT CYL 34X4.125X (TOURNIQUET CUFF) ×2 IMPLANT
DRAPE INCISE IOBAN 66X45 STRL (DRAPES) ×2 IMPLANT
DRAPE U-SHAPE 47X51 STRL (DRAPES) ×2 IMPLANT
DRSG AQUACEL AG ADV 3.5X10 (GAUZE/BANDAGES/DRESSINGS) ×2 IMPLANT
DURAPREP 26ML APPLICATOR (WOUND CARE) ×2 IMPLANT
ELECT REM PT RETURN 15FT ADLT (MISCELLANEOUS) ×2 IMPLANT
GLOVE BIO SURGEON STRL SZ 6.5 (GLOVE) IMPLANT
GLOVE BIO SURGEON STRL SZ7.5 (GLOVE) IMPLANT
GLOVE BIO SURGEON STRL SZ8 (GLOVE) ×2 IMPLANT
GLOVE BIOGEL PI IND STRL 6.5 (GLOVE) IMPLANT
GLOVE BIOGEL PI IND STRL 7.0 (GLOVE) IMPLANT
GLOVE BIOGEL PI IND STRL 8 (GLOVE) ×2 IMPLANT
GOWN STRL REUS W/ TWL LRG LVL3 (GOWN DISPOSABLE) ×2 IMPLANT
GOWN STRL REUS W/ TWL XL LVL3 (GOWN DISPOSABLE) IMPLANT
GOWN STRL REUS W/TWL LRG LVL3 (GOWN DISPOSABLE) ×1
GOWN STRL REUS W/TWL XL LVL3 (GOWN DISPOSABLE)
HANDPIECE INTERPULSE COAX TIP (DISPOSABLE) ×1
HOLDER FOLEY CATH W/STRAP (MISCELLANEOUS) IMPLANT
IMMOBILIZER KNEE 20 (SOFTGOODS) ×1
IMMOBILIZER KNEE 20 THIGH 36 (SOFTGOODS) ×2 IMPLANT
KIT TURNOVER KIT A (KITS) IMPLANT
MANIFOLD NEPTUNE II (INSTRUMENTS) ×2 IMPLANT
NS IRRIG 1000ML POUR BTL (IV SOLUTION) ×2 IMPLANT
PACK TOTAL KNEE CUSTOM (KITS) ×2 IMPLANT
PADDING CAST COTTON 6X4 STRL (CAST SUPPLIES) ×4 IMPLANT
PATELLA MEDIAL ATTUN 35MM KNEE (Knees) IMPLANT
PIN STEINMAN FIXATION KNEE (PIN) IMPLANT
PROTECTOR NERVE ULNAR (MISCELLANEOUS) ×2 IMPLANT
SET HNDPC FAN SPRY TIP SCT (DISPOSABLE) ×2 IMPLANT
SPIKE FLUID TRANSFER (MISCELLANEOUS) ×2 IMPLANT
STRIP CLOSURE SKIN 1/2X4 (GAUZE/BANDAGES/DRESSINGS) ×4 IMPLANT
SUT MNCRL AB 4-0 PS2 18 (SUTURE) ×2 IMPLANT
SUT STRATAFIX 0 PDS 27 VIOLET (SUTURE) ×1
SUT VIC AB 2-0 CT1 27 (SUTURE) ×3
SUT VIC AB 2-0 CT1 TAPERPNT 27 (SUTURE) ×6 IMPLANT
SUTURE STRATFX 0 PDS 27 VIOLET (SUTURE) ×2 IMPLANT
TIBIAL BASE ROT PLAT SZ 5 KNEE (Knees) ×1 IMPLANT
TRAY FOLEY MTR SLVR 16FR STAT (SET/KITS/TRAYS/PACK) ×2 IMPLANT
TUBE SUCTION HIGH CAP CLEAR NV (SUCTIONS) ×2 IMPLANT
WATER STERILE IRR 1000ML POUR (IV SOLUTION) ×4 IMPLANT
WRAP KNEE MAXI GEL POST OP (GAUZE/BANDAGES/DRESSINGS) ×2 IMPLANT

## 2022-07-19 NOTE — Op Note (Signed)
OPERATIVE REPORT-TOTAL KNEE ARTHROPLASTY   Pre-operative diagnosis- Osteoarthritis  Right knee(s)  Post-operative diagnosis- Osteoarthritis Right knee(s)  Procedure-  Right  Total Knee Arthroplasty  Surgeon- Gus Rankin. Nikie Cid, MD  Assistant- Arther Abbott, PA-C   Anesthesia-   Adductor canal block and spinal  EBL-50 mL   Drains None  Tourniquet time-  Total Tourniquet Time Documented: Thigh (Right) - 27 minutes Total: Thigh (Right) - 27 minutes     Complications- None  Condition-PACU - hemodynamically stable.   Brief Clinical Note  Nancy Baxter is a 82 y.o. year old female with end stage OA of his right knee with progressively worsening pain and dysfunction. He has constant pain, with activity and at rest and significant functional deficits with difficulties even with ADLs. He has had extensive non-op management including analgesics, injections of cortisone and viscosupplements, and home exercise program, but remains in significant pain with significant dysfunction. Radiographs show bone on bone arthritis lateral and patellofemoral with large valgus deformity. He presents now for right Total Knee Arthroplasty.     Procedure in detail---   The patient is brought into the operating room and positioned supine on the operating table. After successful administration of  adductor canal block and spinal,   a tourniquet is placed high on the  Right thigh(s) and the lower extremity is prepped and draped in the usual sterile fashion. Time out is performed by the operating team and then the  Right lower extremity is wrapped in Esmarch, knee flexed and the tourniquet inflated to 300 mmHg.       A midline incision is made with a ten blade through the subcutaneous tissue to the level of the extensor mechanism. A fresh blade is used to make a medial parapatellar arthrotomy. Soft tissue over the proximal medial tibia is subperiosteally elevated to the joint line with a knife and into the  semimembranosus bursa with a Cobb elevator. Soft tissue over the proximal lateral tibia is elevated with attention being paid to avoiding the patellar tendon on the tibial tubercle. The patella is everted, knee flexed 90 degrees and the ACL and PCL are removed. Findings are bone on bone lateral and patellofemoral with large global osteophytes        The drill is used to create a starting hole in the distal femur and the canal is thoroughly irrigated with sterile saline to remove the fatty contents. The 5 degree Right  valgus alignment guide is placed into the femoral canal and the distal femoral cutting block is pinned to remove 9 mm off the distal femur. Resection is made with an oscillating saw.      The tibia is subluxed forward and the menisci are removed. The extramedullary alignment guide is placed referencing proximally at the medial aspect of the tibial tubercle and distally along the second metatarsal axis and tibial crest. The block is pinned to remove 13mm off the more deficient lateral  side. Resection is made with an oscillating saw. Size 5is the most appropriate size for the tibia and the proximal tibia is prepared with the modular drill and keel punch for that size.      The femoral sizing guide is placed and size 5 is most appropriate. Rotation is marked off the epicondylar axis and confirmed by creating a rectangular flexion gap at 90 degrees. The size 5 cutting block is pinned in this rotation and the anterior, posterior and chamfer cuts are made with the oscillating saw. The intercondylar block is then placed  and that cut is made.      Trial size 5 tibial component, trial size 5 narrow posterior stabilized femur and a 8  mm posterior stabilized rotating platform insert trial is placed. Full extension is achieved with excellent varus/valgus and anterior/posterior balance throughout full range of motion. The patella is everted and thickness measured to be 22  mm. Free hand resection is taken to  12 mm, a 35 template is placed, lug holes are drilled, trial patella is placed, and it tracks normally. Osteophytes are removed off the posterior femur with the trial in place. All trials are removed and the cut bone surfaces prepared with pulsatile lavage. Cement is mixed and once ready for implantation, the size 5 tibial implant, size  5 narrow posterior stabilized femoral component, and the size 35 patella are cemented in place and the patella is held with the clamp. The trial insert is placed and the knee held in full extension. The Exparel (20 ml mixed with 60 ml saline) is injected into the extensor mechanism, posterior capsule, medial and lateral gutters and subcutaneous tissues.  All extruded cement is removed and once the cement is hard the permanent 8 mm posterior stabilized rotating platform insert is placed into the tibial tray.      The wound is copiously irrigated with saline solution and the extensor mechanism closed with # 0 Stratofix suture. The tourniquet is released for a total tourniquet time of 27  minutes. Flexion against gravity is 140 degrees and the patella tracks normally. Subcutaneous tissue is closed with 2.0 vicryl and subcuticular with running 4.0 Monocryl. The incision is cleaned and dried and steri-strips and a bulky sterile dressing are applied. The limb is placed into a knee immobilizer and the patient is awakened and transported to recovery in stable condition.      Please note that a surgical assistant was a medical necessity for this procedure in order to perform it in a safe and expeditious manner. Surgical assistant was necessary to retract the ligaments and vital neurovascular structures to prevent injury to them and also necessary for proper positioning of the limb to allow for anatomic placement of the prosthesis.   Gus Rankin Tomi Grandpre, MD    07/19/2022, 11:25 AM

## 2022-07-19 NOTE — Interval H&P Note (Signed)
History and Physical Interval Note:  07/19/2022 8:27 AM  Nancy Nancy  has presented today for surgery, with the diagnosis of right knee osteoarthritis.  The various methods of treatment have been discussed with the patient and family. After consideration of risks, benefits and other options for treatment, the patient has consented to  Procedure(s): TOTAL KNEE ARTHROPLASTY (Right) as a surgical intervention.  The patient's history has been reviewed, patient examined, no change in status, stable for surgery.  I have reviewed the patient's chart and labs.  Questions were answered to the patient's satisfaction.     Nancy Nancy

## 2022-07-19 NOTE — Anesthesia Postprocedure Evaluation (Signed)
Anesthesia Post Note  Patient: Nancy Baxter  Procedure(s) Performed: TOTAL KNEE ARTHROPLASTY (Right: Knee)     Patient location during evaluation: PACU Anesthesia Type: Spinal Level of consciousness: awake and alert Pain management: pain level controlled Vital Signs Assessment: post-procedure vital signs reviewed and stable Respiratory status: spontaneous breathing, nonlabored ventilation and respiratory function stable Cardiovascular status: blood pressure returned to baseline Postop Assessment: no apparent nausea or vomiting, spinal receding, no headache and no backache Anesthetic complications: no   No notable events documented.  Last Vitals:  Vitals:   07/19/22 1216 07/19/22 1230  BP:  128/67  Pulse: 71 65  Resp: 17 12  Temp:    SpO2: 91% 99%    Last Pain:  Vitals:   07/19/22 1230  TempSrc:   PainSc: 0-No pain                 Shanda Howells

## 2022-07-19 NOTE — Anesthesia Procedure Notes (Addendum)
Spinal  Patient location during procedure: OR Start time: 07/19/2022 10:23 AM End time: 07/19/2022 10:26 AM Reason for block: surgical anesthesia Staffing Performed: anesthesiologist  Anesthesiologist: Kaylyn Layer, MD Performed by: Kaylyn Layer, MD Authorized by: Kaylyn Layer, MD   Preanesthetic Checklist Completed: patient identified, IV checked, risks and benefits discussed, surgical consent, monitors and equipment checked, pre-op evaluation and timeout performed Spinal Block Patient position: sitting Prep: DuraPrep and site prepped and draped Patient monitoring: continuous pulse ox, blood pressure and heart rate Approach: right paramedian Location: L3-4 Injection technique: single-shot Needle Needle type: Whitacre  Needle gauge: 22 G Needle length: 9 cm Assessment Events: CSF return Additional Notes Risks, benefits, and alternative discussed. Patient gave consent to procedure. Prepped and draped in sitting position. Patient sedated but responsive to voice. Clear CSF obtained after one needle redirection. Positive terminal aspiration. No pain or paraesthesias with injection. Patient tolerated procedure well. Vital signs stable. Amalia Greenhouse, MD

## 2022-07-19 NOTE — Discharge Instructions (Signed)
 Frank Aluisio, MD Total Joint Specialist [facility] [address] [place], [place] [address] [phone]  TOTAL KNEE REPLACEMENT POSTOPERATIVE DIRECTIONS    Knee Rehabilitation, Guidelines Following Surgery  Results after knee surgery are often greatly improved when you follow the exercise, range of motion and muscle strengthening exercises prescribed by your doctor. Safety measures are also important to protect the knee from further injury. If any of these exercises cause you to have increased pain or swelling in your knee joint, decrease the amount until you are comfortable again and slowly increase them. If you have problems or questions, call your caregiver or physical therapist for advice.   HOME CARE INSTRUCTIONS  Remove items at home which could result in a fall. This includes throw rugs or furniture in walking pathways.  ICE to the affected knee as much as tolerated. Icing helps control swelling. If the swelling is well controlled you will be more comfortable and rehab easier. Continue to use ice on the knee for pain and swelling from surgery. You may notice swelling that will progress down to the foot and ankle. This is normal after surgery. Elevate the leg when you are not up walking on it.    Continue to use the breathing machine which will help keep your temperature down. It is common for your temperature to cycle up and down following surgery, especially at night when you are not up moving around and exerting yourself. The breathing machine keeps your lungs expanded and your temperature down. Do not place pillow under the operative knee, focus on keeping the knee straight while resting  DIET You may resume your previous home diet once you are discharged from the hospital.  DRESSING / WOUND CARE / SHOWERING Keep your bulky bandage on for 2 days. On the third post-operative day you may remove the Ace bandage and gauze. There is a waterproof  adhesive bandage on your skin which will stay in place until your first follow-up appointment. Once you remove this you will not need to place another bandage You may begin showering 3 days following surgery, but do not submerge the incision under water.  ACTIVITY For the first 5 days, the key is rest and control of pain and swelling Do your home exercises twice a day starting on post-operative day 3. On the days you go to physical therapy, just do the home exercises once that day. You should rest, ice and elevate the leg for 50 minutes out of every hour. Get up and walk/stretch for 10 minutes per hour. After 5 days you can increase your activity slowly as tolerated. Walk with your walker as instructed. Use the walker until you are comfortable transitioning to a cane. Walk with the cane in the opposite hand of the operative leg. You may discontinue the cane once you are comfortable and walking steadily. Avoid periods of inactivity such as sitting longer than an hour when not asleep. This helps prevent blood clots.  You may discontinue the knee immobilizer once you are able to perform a straight leg raise while lying down. You may resume a sexual relationship in one month or when given the OK by your doctor.  You may return to work once you are cleared by your doctor.  Do not drive a car for 6 weeks or until released by your surgeon.  Do not drive while taking narcotics.  TED HOSE STOCKINGS Wear the elastic stockings on both legs for three weeks following surgery during the   day. You may remove them at night for sleeping.  WEIGHT BEARING Weight bearing as tolerated with assist device (walker, cane, etc) as directed, use it as long as suggested by your surgeon or therapist, typically at least 4-6 weeks.  POSTOPERATIVE CONSTIPATION PROTOCOL Constipation - defined medically as fewer than three stools per week and severe constipation as less than one stool per week.  One of the most common issues  patients have following surgery is constipation.  Even if you have a regular bowel pattern at home, your normal regimen is likely to be disrupted due to multiple reasons following surgery.  Combination of anesthesia, postoperative narcotics, change in appetite and fluid intake all can affect your bowels.  In order to avoid complications following surgery, here are some recommendations in order to help you during your recovery period.  Colace (docusate) - Pick up an over-the-counter form of Colace or another stool softener and take twice a day as long as you are requiring postoperative pain medications.  Take with a full glass of water daily.  If you experience loose stools or diarrhea, hold the colace until you stool forms back up. If your symptoms do not get better within 1 week or if they get worse, check with your doctor. Dulcolax (bisacodyl) - Pick up over-the-counter and take as directed by the product packaging as needed to assist with the movement of your bowels.  Take with a full glass of water.  Use this product as needed if not relieved by Colace only.  MiraLax (polyethylene glycol) - Pick up over-the-counter to have on hand. MiraLax is a solution that will increase the amount of water in your bowels to assist with bowel movements.  Take as directed and can mix with a glass of water, juice, soda, coffee, or tea. Take if you go more than two days without a movement. Do not use MiraLax more than once per day. Call your doctor if you are still constipated or irregular after using this medication for 7 days in a row.  If you continue to have problems with postoperative constipation, please contact the office for further assistance and recommendations.  If you experience "the worst abdominal pain ever" or develop nausea or vomiting, please contact the office immediatly for further recommendations for treatment.  ITCHING If you experience itching with your medications, try taking only a single pain  pill, or even half a pain pill at a time.  You can also use Benadryl over the counter for itching or also to help with sleep.   MEDICATIONS See your medication summary on the "After Visit Summary" that the nursing staff will review with you prior to discharge.  You may have some home medications which will be placed on hold until you complete the course of blood thinner medication.  It is important for you to complete the blood thinner medication as prescribed by your surgeon.  Continue your approved medications as instructed at time of discharge.  PRECAUTIONS If you experience chest pain or shortness of breath - call 911 immediately for transfer to the hospital emergency department.  If you develop a fever greater that 101 F, purulent drainage from wound, increased redness or drainage from wound, foul odor from the wound/dressing, or calf pain - CONTACT YOUR SURGEON.                                                     FOLLOW-UP APPOINTMENTS Make sure you keep all of your appointments after your operation with your surgeon and caregivers. You should call the office at the above phone number and make an appointment for approximately two weeks after the date of your surgery or on the date instructed by your surgeon outlined in the "After Visit Summary".  RANGE OF MOTION AND STRENGTHENING EXERCISES  Rehabilitation of the knee is important following a knee injury or an operation. After just a few days of immobilization, the muscles of the thigh which control the knee become weakened and shrink (atrophy). Knee exercises are designed to build up the tone and strength of the thigh muscles and to improve knee motion. Often times heat used for twenty to thirty minutes before working out will loosen up your tissues and help with improving the range of motion but do not use heat for the first two weeks following surgery. These exercises can be done on a training (exercise) mat, on the floor, on a table or on a bed.  Use what ever works the best and is most comfortable for you Knee exercises include:  Leg Lifts - While your knee is still immobilized in a splint or cast, you can do straight leg raises. Lift the leg to 60 degrees, hold for 3 sec, and slowly lower the leg. Repeat 10-20 times 2-3 times daily. Perform this exercise against resistance later as your knee gets better.  Quad and Hamstring Sets - Tighten up the muscle on the front of the thigh (Quad) and hold for 5-10 sec. Repeat this 10-20 times hourly. Hamstring sets are done by pushing the foot backward against an object and holding for 5-10 sec. Repeat as with quad sets.  Leg Slides: Lying on your back, slowly slide your foot toward your buttocks, bending your knee up off the floor (only go as far as is comfortable). Then slowly slide your foot back down until your leg is flat on the floor again. Angel Wings: Lying on your back spread your legs to the side as far apart as you can without causing discomfort.  A rehabilitation program following serious knee injuries can speed recovery and prevent re-injury in the future due to weakened muscles. Contact your doctor or a physical therapist for more information on knee rehabilitation.   POST-OPERATIVE OPIOID TAPER INSTRUCTIONS: It is important to wean off of your opioid medication as soon as possible. If you do not need pain medication after your surgery it is ok to stop day one. Opioids include: Codeine, Hydrocodone(Norco, Vicodin), Oxycodone(Percocet, oxycontin) and hydromorphone amongst others.  Long term and even short term use of opiods can cause: Increased pain response Dependence Constipation Depression Respiratory depression And more.  Withdrawal symptoms can include Flu like symptoms Nausea, vomiting And more Techniques to manage these symptoms Hydrate well Eat regular healthy meals Stay active Use relaxation techniques(deep breathing, meditating, yoga) Do Not substitute Alcohol to help  with tapering If you have been on opioids for less than two weeks and do not have pain than it is ok to stop all together.  Plan to wean off of opioids This plan should start within one week post op of your joint replacement. Maintain the same interval or time between taking each dose and first decrease the dose.  Cut the total daily intake of opioids by one tablet each day Next start to increase the time between doses. The last dose that should be eliminated is the evening dose.   IF YOU ARE TRANSFERRED TO   A SKILLED REHAB FACILITY If the patient is transferred to a skilled rehab facility following release from the hospital, a list of the current medications will be sent to the facility for the patient to continue.  When discharged from the skilled rehab facility, please have the facility set up the patient's Home Health Physical Therapy prior to being released. Also, the skilled facility will be responsible for providing the patient with their medications at time of release from the facility to include their pain medication, the muscle relaxants, and their blood thinner medication. If the patient is still at the rehab facility at time of the two week follow up appointment, the skilled rehab facility will also need to assist the patient in arranging follow up appointment in our office and any transportation needs.  MAKE SURE YOU:  Understand these instructions.  Get help right away if you are not doing well or get worse.   DENTAL ANTIBIOTICS:  In most cases prophylactic antibiotics for Dental procdeures after total joint surgery are not necessary.  Exceptions are as follows:  1. History of prior total joint infection  2. Severely immunocompromised (Organ Transplant, cancer chemotherapy, Rheumatoid biologic meds such as Humera)  3. Poorly controlled diabetes (A1C &gt; 8.0, blood glucose over 200)  If you have one of these conditions, contact your surgeon for an antibiotic prescription,  prior to your dental procedure.    Pick up stool softner and laxative for home use following surgery while on pain medications. Do not submerge incision under water. Please use good hand washing techniques while changing dressing each day. May shower starting three days after surgery. Please use a clean towel to pat the incision dry following showers. Continue to use ice for pain and swelling after surgery. Do not use any lotions or creams on the incision until instructed by your surgeon.  

## 2022-07-19 NOTE — Transfer of Care (Signed)
Immediate Anesthesia Transfer of Care Note  Patient: Nancy Baxter  Procedure(s) Performed: TOTAL KNEE ARTHROPLASTY (Right: Knee)  Patient Location: PACU  Anesthesia Type:Spinal  Level of Consciousness: awake and patient cooperative  Airway & Oxygen Therapy: Patient Spontanous Breathing and Patient connected to face mask oxygen  Post-op Assessment: Report given to RN and Post -op Vital signs reviewed and stable  Post vital signs: Reviewed and stable  Last Vitals:  Vitals Value Taken Time  BP 95/45 07/19/22 1140  Temp    Pulse 70 07/19/22 1143  Resp 20 07/19/22 1143  SpO2 100 % 07/19/22 1143  Vitals shown include unvalidated device data.  Last Pain:  Vitals:   07/19/22 1015  TempSrc:   PainSc: 0-No pain      Patients Stated Pain Goal: 4 (07/19/22 0817)  Complications: No notable events documented.

## 2022-07-19 NOTE — Evaluation (Signed)
Physical Therapy Evaluation Patient Details Name: Nancy Baxter MRN: [id number] DOB: March 16, 1941 Today's Date: 07/19/2022  History of Present Illness  82 yo female s/p R TKA 07/19/22. PMGH: afib, HTN, CKD, mitral valve regurgitation, bil THA  Clinical Impression  Pt is s/p TKA resulting in the deficits listed below (see PT Problem List).  Pt  amb 25' with RW and min assist. Anticipate steady progress in acute setting.   Pt will benefit from acute skilled PT to increase their independence and safety with mobility to allow discharge.         Recommendations for follow up therapy are one component of a multi-disciplinary discharge planning process, led by the attending physician.  Recommendations may be updated based on patient status, additional functional criteria and insurance authorization.  Follow Up Recommendations       Assistance Recommended at Discharge Intermittent Supervision/Assistance  Patient can return home with the following  A little help with bathing/dressing/bathroom;A little help with walking and/or transfers;Assistance with cooking/housework;Assist for transportation;Help with stairs or ramp for entrance    Equipment Recommendations None recommended by PT  Recommendations for Other Services       Functional Status Assessment Patient has had a recent decline in their functional status and demonstrates the ability to make significant improvements in function in a reasonable and predictable amount of time.     Precautions / Restrictions Precautions Precautions: Fall;Knee Required Braces or Orthoses: Knee Immobilizer - Right Knee Immobilizer - Right: Discontinue once straight leg raise with < 10 degree lag Restrictions Weight Bearing Restrictions: No Other Position/Activity Restrictions: WBAT      Mobility  Bed Mobility Overal bed mobility: Needs Assistance Bed Mobility: Supine to Sit     Supine to sit: Supervision     General bed mobility comments:  for safety    Transfers Overall transfer level: Needs assistance Equipment used: Rolling walker (2 wheels) Transfers: Sit to/from Stand Sit to Stand: Min guard           General transfer comment: cues for hand placement and RLE position    Ambulation/Gait Ambulation/Gait assistance: Min assist Gait Distance (Feet): 25 Feet Assistive device: Rolling walker (2 wheels) Gait Pattern/deviations: Step-to pattern, Decreased weight shift to right       General Gait Details: cues for sequence and RW distance from self  Stairs            Wheelchair Mobility    Modified Rankin (Stroke Patients Only)       Balance                                             Pertinent Vitals/Pain Pain Assessment Pain Assessment: 0-10 Pain Score: 3  Pain Location: right knee Pain Descriptors / Indicators: Aching, Discomfort, Sore Pain Intervention(s): Limited activity within patient's tolerance, Monitored during session, Premedicated before session    Home Living Family/patient expects to be discharged to:: Private residence Living Arrangements: Alone Available Help at Discharge: Family Type of Home: House Home Access: Stairs to enter Entrance Stairs-Rails: Right;Left;Can reach both Entrance Stairs-Number of Steps: 7   Home Layout: Multi-level;Able to live on main level with bedroom/bathroom Home Equipment: Rolling Walker (2 wheels) Additional Comments: pt had her stairs made shorter for her dog    Prior Function Prior Level of Function : Independent/Modified Independent;Working/employed;Driving  Hand Dominance        Extremity/Trunk Assessment   Upper Extremity Assessment Upper Extremity Assessment: Overall WFL for tasks assessed    Lower Extremity Assessment Lower Extremity Assessment: RLE deficits/detail RLE Deficits / Details: ankle WFL, SLR with15 degree quad lag       Communication   Communication: No  difficulties  Cognition Arousal/Alertness: Awake/alert Behavior During Therapy: WFL for tasks assessed/performed Overall Cognitive Status: Within Functional Limits for tasks assessed                                          General Comments      Exercises Total Joint Exercises Ankle Circles/Pumps: AROM, Both, 10 reps Quad Sets: 5 reps, Right, AROM   Assessment/Plan    PT Assessment Patient needs continued PT services  PT Problem List Decreased strength;Decreased range of motion;Decreased activity tolerance;Decreased knowledge of use of DME;Pain;Decreased mobility       PT Treatment Interventions DME instruction;Therapeutic activities;Gait training;Functional mobility training;Therapeutic exercise;Patient/family education    PT Goals (Current goals can be found in the Care Plan section)  Acute Rehab PT Goals Patient Stated Goal: home, less knee pain PT Goal Formulation: With patient Time For Goal Achievement: 07/26/22 Potential to Achieve Goals: Good    Frequency 7X/week     Co-evaluation               AM-PAC PT "6 Clicks" Mobility  Outcome Measure Help needed turning from your back to your side while in a flat bed without using bedrails?: A Little Help needed moving from lying on your back to sitting on the side of a flat bed without using bedrails?: A Little Help needed moving to and from a bed to a chair (including a wheelchair)?: A Little Help needed standing up from a chair using your arms (e.g., wheelchair or bedside chair)?: A Little Help needed to walk in hospital room?: A Little Help needed climbing 3-5 steps with a railing? : A Little 6 Click Score: 18    End of Session Equipment Utilized During Treatment: Gait belt;Right knee immobilizer Activity Tolerance: Patient tolerated treatment well Patient left: in chair;with chair alarm set;with call bell/phone within reach Nurse Communication: Mobility status PT Visit Diagnosis: Other  abnormalities of gait and mobility (R26.89);History of falling (Z91.81)    Time: 1884-1660 PT Time Calculation (min) (ACUTE ONLY): 29 min   Charges:   PT Evaluation $PT Eval Low Complexity: 1 Low PT Treatments $Gait Training: 8-22 mins        Delice Bison, PT  Acute Rehab Dept [facility]) [phone]  07/19/2022   [facility] 07/19/2022, 4:18 PM

## 2022-07-19 NOTE — Progress Notes (Signed)
Orthopedic Tech Progress Note Patient Details:  Nancy Baxter [birth year] [id number] CPM will be removed at 4:10pm.  CPM Right Knee CPM Right Knee: On Right Knee Flexion (Degrees): 40 Right Knee Extension (Degrees): 0  Post Interventions Patient Tolerated: Well  Genelle Bal Javaun Dimperio 07/19/2022, 12:19 PM

## 2022-07-19 NOTE — Anesthesia Procedure Notes (Addendum)
Anesthesia Regional Block: Adductor canal block   Pre-Anesthetic Checklist: , timeout performed,  Correct Patient, Correct Site, Correct Laterality,  Correct Procedure, Correct Position, site marked,  Risks and benefits discussed,  Pre-op evaluation,  At surgeon's request and post-op pain management  Laterality: Right  Prep: Maximum Sterile Barrier Precautions used, chloraprep       Needles:  Injection technique: Single-shot  Needle Type: Echogenic Stimulator Needle     Needle Length: 9cm  Needle Gauge: 22     Additional Needles:   Procedures:,,,, ultrasound used (permanent image in chart),,    Narrative:  Start time: 07/19/2022 10:03 AM End time: 07/19/2022 10:06 AM Injection made incrementally with aspirations every 5 mL.  Performed by: Personally  Anesthesiologist: Kaylyn Layer, MD  Additional Notes: Risks, benefits, and alternative discussed. Patient gave consent for procedure. Patient prepped and draped in sterile fashion. Sedation administered, patient remains easily responsive to voice. Relevant anatomy identified with ultrasound guidance. Local anesthetic given in 5cc increments with no signs or symptoms of intravascular injection. No pain or paraesthesias with injection. Patient monitored throughout procedure with signs of LAST or immediate complications. Tolerated well. Ultrasound image placed in chart.  Amalia Greenhouse, MD

## 2022-07-19 NOTE — Care Plan (Signed)
Ortho Bundle Case Management Note  Patient Details  Name: Nancy Baxter MRN: [id number] Date of Birth: 1940/07/15  R TKA on 07-19-22 DCP:  Home with friends DME:  No needs, has a RW PT:  [facility] on 07-22-22                   DME Arranged:  N/A DME Agency:  NA  HH Arranged:  NA HH Agency:  NA  Additional Comments: Please contact me with any questions of if this plan should need to change.  Ennis Forts, RN,CCM [facility]  [phone] 07/19/2022, 9:33 AM

## 2022-07-20 ENCOUNTER — Encounter: Payer: Self-pay | Admitting: Internal Medicine

## 2022-07-20 ENCOUNTER — Non-Acute Institutional Stay (SKILLED_NURSING_FACILITY): Payer: PPO | Admitting: Internal Medicine

## 2022-07-20 DIAGNOSIS — R600 Localized edema: Secondary | ICD-10-CM | POA: Diagnosis not present

## 2022-07-20 DIAGNOSIS — Z96651 Presence of right artificial knee joint: Secondary | ICD-10-CM

## 2022-07-20 DIAGNOSIS — N1832 Chronic kidney disease, stage 3b: Secondary | ICD-10-CM

## 2022-07-20 DIAGNOSIS — K219 Gastro-esophageal reflux disease without esophagitis: Secondary | ICD-10-CM | POA: Diagnosis not present

## 2022-07-20 DIAGNOSIS — I10 Essential (primary) hypertension: Secondary | ICD-10-CM | POA: Diagnosis not present

## 2022-07-20 DIAGNOSIS — M1711 Unilateral primary osteoarthritis, right knee: Secondary | ICD-10-CM | POA: Diagnosis not present

## 2022-07-20 DIAGNOSIS — I480 Paroxysmal atrial fibrillation: Secondary | ICD-10-CM | POA: Diagnosis not present

## 2022-07-20 DIAGNOSIS — D508 Other iron deficiency anemias: Secondary | ICD-10-CM

## 2022-07-20 DIAGNOSIS — I340 Nonrheumatic mitral (valve) insufficiency: Secondary | ICD-10-CM | POA: Diagnosis not present

## 2022-07-20 LAB — CBC
HCT: 32.9 % — ABNORMAL LOW (ref 36.0–46.0)
Hemoglobin: 11.3 g/dL — ABNORMAL LOW (ref 12.0–15.0)
MCH: 32.5 pg (ref 26.0–34.0)
MCHC: 34.3 g/dL (ref 30.0–36.0)
MCV: 94.5 fL (ref 80.0–100.0)
Platelets: 125 10*3/uL — ABNORMAL LOW (ref 150–400)
RBC: 3.48 MIL/uL — ABNORMAL LOW (ref 3.87–5.11)
RDW: 13.2 % (ref 11.5–15.5)
WBC: 10.1 10*3/uL (ref 4.0–10.5)
nRBC: 0 % (ref 0.0–0.2)

## 2022-07-20 LAB — BASIC METABOLIC PANEL
Anion gap: 9 (ref 5–15)
BUN: 35 mg/dL — ABNORMAL HIGH (ref 8–23)
CO2: 22 mmol/L (ref 22–32)
Calcium: 8.5 mg/dL — ABNORMAL LOW (ref 8.9–10.3)
Chloride: 103 mmol/L (ref 98–111)
Creatinine, Ser: 1.57 mg/dL — ABNORMAL HIGH (ref 0.44–1.00)
GFR, Estimated: 33 mL/min — ABNORMAL LOW (ref >60)
Glucose, Bld: 118 mg/dL — ABNORMAL HIGH (ref 70–99)
Potassium: 4.1 mmol/L (ref 3.5–5.1)
Sodium: 134 mmol/L — ABNORMAL LOW (ref 135–145)

## 2022-07-20 MED ORDER — ONDANSETRON HCL 4 MG PO TABS: 4.0000 mg | ORAL_TABLET | Freq: Four times a day (QID) | ORAL | 0 refills | Status: DC | PRN

## 2022-07-20 MED ORDER — TRAMADOL HCL 50 MG PO TABS: 50.0000 mg | ORAL_TABLET | Freq: Four times a day (QID) | ORAL | 0 refills | Status: DC | PRN

## 2022-07-20 MED ORDER — METHOCARBAMOL 500 MG PO TABS: 500.0000 mg | ORAL_TABLET | Freq: Four times a day (QID) | ORAL | 0 refills | Status: DC | PRN

## 2022-07-20 MED ORDER — HYDROMORPHONE HCL 2 MG PO TABS: 1.0000 mg | ORAL_TABLET | Freq: Four times a day (QID) | ORAL | 0 refills | Status: DC | PRN

## 2022-07-20 NOTE — Progress Notes (Signed)
   Subjective: 1 Day Post-Op Procedure(s) (LRB): TOTAL KNEE ARTHROPLASTY (Right) Patient reports pain as mild.   Patient seen in rounds by Dr. Lequita Halt. Patient is well, and has had no acute complaints or problems. Doing very well this morning. Denies chest pain or SOB. Foley catheter removed this AM. We will continue therapy today.  Objective: Vital signs in last 24 hours: Temp:  [97.6 F (36.4 C)-98.3 F (36.8 C)] 97.9 F (36.6 C) (04/09 0555) Pulse Rate:  [62-73] 71 (04/09 0555) Resp:  [11-31] 16 (04/09 0555) BP: (95-152)/(45-84) 112/76 (04/09 0555) SpO2:  [91 %-100 %] 96 % (04/09 0555)  Intake/Output from previous day:  Intake/Output Summary (Last 24 hours) at 07/20/2022 0841 Last data filed at 07/20/2022 0600 Gross per 24 hour  Intake 3242.59 ml  Output 2445 ml  Net 797.59 ml     Intake/Output this shift: No intake/output data recorded.  Labs: Recent Labs    07/20/22 0343  HGB 11.3*   Recent Labs    07/20/22 0343  WBC 10.1  RBC 3.48*  HCT 32.9*  PLT 125*   Recent Labs    07/20/22 0343  NA 134*  K 4.1  CL 103  CO2 22  BUN 35*  CREATININE 1.57*  GLUCOSE 118*  CALCIUM 8.5*   No results for input(s): "LABPT", "INR" in the last 72 hours.  Exam: General - Patient is Alert and Oriented Extremity - Neurologically intact Neurovascular intact Sensation intact distally Dorsiflexion/Plantar flexion intact Dressing - dressing C/D/I Motor Function - intact, moving foot and toes well on exam.   Past Medical History:  Diagnosis Date   A-fib    Anemia    pt. denies   Arthritis    GERD (gastroesophageal reflux disease)    Heart murmur    evaluated 72yr ago-no cardiac follow up needed   Hypertension    Postop Acute blood loss anemia 01/06/2012   Postop Hyponatremia 01/06/2012   Postop Transfusion 01/07/2012   Restless leg     Assessment/Plan: 1 Day Post-Op Procedure(s) (LRB): TOTAL KNEE ARTHROPLASTY (Right) Principal Problem:   OA  (osteoarthritis) of knee Active Problems:   Primary osteoarthritis of right knee  Estimated body mass index is 25.06 kg/m as calculated from the following:   Height as of this encounter: 4' 10.5" (1.486 m).   Weight as of this encounter: 55.3 kg. Advance diet Up with therapy D/C IV fluids   Patient's anticipated LOS is less than 2 midnights, meeting these requirements: - Lives within 1 hour of care - Has a competent adult at home to recover with post-op recover - NO history of  - Chronic pain requiring opiods  - Diabetes  - Coronary Artery Disease  - Heart failure  - Heart attack  - Stroke  - DVT/VTE  - Respiratory Failure/COPD  - Renal failure  - Anemia  - Advanced Liver disease  DVT Prophylaxis -  Eliquis Weight bearing as tolerated. Continue therapy.  Plan is to go to Skilled nursing facility after hospital stay. Discharge to [facility] once bed available.  Follow-up in the office in 2 weeks.  The PDMP database was reviewed today prior to any opioid medications being prescribed to this patient.  Arther Abbott, PA-C Orthopedic Surgery [phone] 07/20/2022, 8:41 AM

## 2022-07-20 NOTE — TOC Transition Note (Signed)
Transition of Care [facility]) - CM/SW Discharge Note   Patient Details  Name: Nancy Baxter MRN: [id number] Date of Birth: 1940-10-20  Transition of Care [facility]) CM/SW Contact:  Amada Jupiter, LCSW Phone Number: 07/20/2022, 12:42 PM   Clinical Narrative:     Pt reports dc plan is to go to rehab at [facility].  Have confirmed with [facility] that rehab bed is ready today.  Daughter to provide tranportation.  RN to call report to [phone].  No further TOC needs.  Final next level of care: Skilled Nursing Facility Barriers to Discharge: No Barriers Identified   Patient Goals and CMS Choice      Discharge Placement                Patient chooses bed at: [facility] Patient to be transferred to facility by: daughter Name of family member notified: daughter Patient and family notified of of transfer: 07/20/22  Discharge Plan and Services Additional resources added to the After Visit Summary for                  DME Arranged: N/A DME Agency: NA       HH Arranged: NA HH Agency: NA        Social Determinants of Health (SDOH) Interventions SDOH Screenings   Food Insecurity: No Food Insecurity (07/19/2022)  Housing: Low Risk  (07/19/2022)  Transportation Needs: No Transportation Needs (07/19/2022)  Utilities: Not At Risk (07/19/2022)  Tobacco Use: Low Risk  (07/19/2022)     Readmission Risk Interventions     No data to display

## 2022-07-20 NOTE — Discharge Summary (Signed)
Physician Discharge Summary   Patient ID: Nancy Nancy MRN: [id number] DOB/AGE: 1940/07/06 82 y.o.  Admit date: 07/19/2022 Discharge date: 07/20/2022  Primary Diagnosis: Osteoarthritis, right knee   Admission Diagnoses:  Past Medical History:  Diagnosis Date   A-fib    Anemia    pt. denies   Arthritis    GERD (gastroesophageal reflux disease)    Heart murmur    evaluated 12yr ago-no cardiac follow up needed   Hypertension    Postop Acute blood loss anemia 01/06/2012   Postop Hyponatremia 01/06/2012   Postop Transfusion 01/07/2012   Restless leg    Discharge Diagnoses:   Principal Problem:   OA (osteoarthritis) of knee Active Problems:   Primary osteoarthritis of right knee  Estimated body mass index is 25.06 kg/m as calculated from the following:   Height as of this encounter: 4' 10.5" (1.486 m).   Weight as of this encounter: 55.3 kg.  Procedure:  Procedure(s) (LRB): TOTAL KNEE ARTHROPLASTY (Right)   Consults: None  HPI: Nancy Nancy is a 82 y.o. year old female with end stage OA of his right knee with progressively worsening pain and dysfunction. He has constant pain, with activity and at rest and significant functional deficits with difficulties even with ADLs. He has had extensive non-op management including analgesics, injections of cortisone and viscosupplements, and home exercise program, but remains in significant pain with significant dysfunction. Radiographs show bone on bone arthritis lateral and patellofemoral with large valgus deformity. He presents now for right Total Knee Arthroplasty.     Laboratory Data: Admission on 07/19/2022  Component Date Value Ref Range Status   WBC 07/20/2022 10.1  4.0 - 10.5 K/uL Final   RBC 07/20/2022 3.48 (L)  3.87 - 5.11 MIL/uL Final   Hemoglobin 07/20/2022 11.3 (L)  12.0 - 15.0 g/dL Final   HCT 51/76/1607 32.9 (L)  36.0 - 46.0 % Final   MCV 07/20/2022 94.5  80.0 - 100.0 fL Final   MCH 07/20/2022 32.5  26.0 - 34.0 pg  Final   MCHC 07/20/2022 34.3  30.0 - 36.0 g/dL Final   RDW 37/01/6268 13.2  11.5 - 15.5 % Final   Platelets 07/20/2022 125 (L)  150 - 400 K/uL Final   nRBC 07/20/2022 0.0  0.0 - 0.2 % Final   Performed at [facility], [address], [place], [place] [address]   Sodium 07/20/2022 134 (L)  135 - 145 mmol/L Final   Potassium 07/20/2022 4.1  3.5 - 5.1 mmol/L Final   Chloride 07/20/2022 103  98 - 111 mmol/L Final   CO2 07/20/2022 22  22 - 32 mmol/L Final   Glucose, Bld 07/20/2022 118 (H)  70 - 99 mg/dL Final   Glucose reference range applies only to samples taken after fasting for at least 8 hours.   BUN 07/20/2022 35 (H)  8 - 23 mg/dL Final   Creatinine, Ser 07/20/2022 1.57 (H)  0.44 - 1.00 mg/dL Final   Calcium 27/06/5007 8.5 (L)  8.9 - 10.3 mg/dL Final   GFR, Estimated 07/20/2022 33 (L)  >60 mL/min Final   Comment: (NOTE) Calculated using the CKD-EPI Creatinine Equation (2021)    Anion gap 07/20/2022 9  5 - 15 Final   Performed at [facility], [address], [place], [place] [address]  Hospital Outpatient Visit on 07/08/2022  Component Date Value Ref Range Status   MRSA, PCR 07/08/2022 NEGATIVE  NEGATIVE Final   Staphylococcus aureus 07/08/2022 NEGATIVE  NEGATIVE  Final   Comment: (NOTE) The Xpert SA Assay (FDA approved for NASAL specimens in patients 83 years of age and older), is one component of a comprehensive surveillance program. It is not intended to diagnose infection nor to guide or monitor treatment. Performed at [facility], [address] [place], [place] [address]    Sodium 07/08/2022 138  135 - 145 mmol/L Final   Potassium 07/08/2022 4.2  3.5 - 5.1 mmol/L Final   Chloride 07/08/2022 104  98 - 111 mmol/L Final   CO2 07/08/2022 26  22 - 32 mmol/L Final   Glucose, Bld 07/08/2022 108 (H)  70 - 99 mg/dL Final   Glucose reference range applies only to samples taken after fasting for at least 8 hours.   BUN  07/08/2022 47 (H)  8 - 23 mg/dL Final   Creatinine, Ser 07/08/2022 1.59 (H)  0.44 - 1.00 mg/dL Final   Calcium 60/45/409803/28/2024 9.3  8.9 - 10.3 mg/dL Final   GFR, Estimated 07/08/2022 32 (L)  >60 mL/min Final   Comment: (NOTE) Calculated using the CKD-EPI Creatinine Equation (2021)    Anion gap 07/08/2022 8  5 - 15 Final   Performed at [facility], [address] [place], [place] [address]   WBC 07/08/2022 6.6  4.0 - 10.5 K/uL Final   RBC 07/08/2022 4.38  3.87 - 5.11 MIL/uL Final   Hemoglobin 07/08/2022 13.8  12.0 - 15.0 g/dL Final   HCT 78/29/562103/28/2024 41.7  36.0 - 46.0 % Final   MCV 07/08/2022 95.2  80.0 - 100.0 fL Final   MCH 07/08/2022 31.5  26.0 - 34.0 pg Final   MCHC 07/08/2022 33.1  30.0 - 36.0 g/dL Final   RDW 30/86/578403/28/2024 13.1  11.5 - 15.5 % Final   Platelets 07/08/2022 144 (L)  150 - 400 K/uL Final   nRBC 07/08/2022 0.0  0.0 - 0.2 % Final   Performed at [facility], [address] [place], [place] [address]     X-Rays:No results found.  EKG: Orders placed or performed in visit on 12/31/21   EKG 12-Lead     Hospital Course: Nancy Nancy is a 82 y.o. who was admitted to [facility]. They were brought to the operating room on 07/19/2022 and underwent Procedure(s): TOTAL KNEE ARTHROPLASTY.  Patient tolerated the procedure well and was later transferred to the recovery room and then to the orthopaedic floor for postoperative care. They were given PO and IV analgesics for pain control following their surgery. They were given 24 hours of postoperative antibiotics of  Anti-infectives (From admission, onward)    Start     Dose/Rate Route Frequency Ordered Stop   07/19/22 1600  ceFAZolin (ANCEF) IVPB 2g/100 mL premix        2 g 200 mL/hr over 30 Minutes Intravenous Every 6 hours 07/19/22 1153 07/19/22 2212   07/19/22 0815  ceFAZolin (ANCEF) IVPB 2g/100 mL premix        2 g 200 mL/hr over 30 Minutes Intravenous On call to O.R. 07/19/22 0801  07/19/22 1037      and started on DVT prophylaxis in the form of  Eliquis .   PT and OT were ordered for total joint protocol. Discharge planning consulted to help with postop disposition and equipment needs.  Patient had a good night on the evening of surgery. They started to get up OOB with therapy on POD #0. Pt was seen during rounds and was ready for  discharge pending bed availability. Arrangements were made with [facility] and patient was discharged.   Diet: Cardiac diet Activity: WBAT Follow-up: in 2 weeks Disposition: Skilled nursing facility Discharged Condition: stable   Discharge Instructions     Call MD / Call 911   Complete by: As directed    If you experience chest pain or shortness of breath, CALL 911 and be transported to the hospital emergency room.  If you develope a fever above 101 F, pus (white drainage) or increased drainage or redness at the wound, or calf pain, call your surgeon's office.   Change dressing   Complete by: As directed    You may remove the bulky bandage (ACE wrap and gauze) two days after surgery. You will have an adhesive waterproof bandage underneath. Leave this in place until your first follow-up appointment.   Constipation Prevention   Complete by: As directed    Drink plenty of fluids.  Prune juice may be helpful.  You may use a stool softener, such as Colace (over the counter) 100 mg twice a day.  Use MiraLax (over the counter) for constipation as needed.   Diet - low sodium heart healthy   Complete by: As directed    Do not put a pillow under the knee. Place it under the heel.   Complete by: As directed    Driving restrictions   Complete by: As directed    No driving for two weeks   Post-operative opioid taper instructions:   Complete by: As directed    POST-OPERATIVE OPIOID TAPER INSTRUCTIONS: It is important to wean off of your opioid medication as soon as possible. If you do not need pain medication after your surgery it is ok to stop  day one. Opioids include: Codeine, Hydrocodone(Norco, Vicodin), Oxycodone(Percocet, oxycontin) and hydromorphone amongst others.  Long term and even short term use of opiods can cause: Increased pain response Dependence Constipation Depression Respiratory depression And more.  Withdrawal symptoms can include Flu like symptoms Nausea, vomiting And more Techniques to manage these symptoms Hydrate well Eat regular healthy meals Stay active Use relaxation techniques(deep breathing, meditating, yoga) Do Not substitute Alcohol to help with tapering If you have been on opioids for less than two weeks and do not have pain than it is ok to stop all together.  Plan to wean off of opioids This plan should start within one week post op of your joint replacement. Maintain the same interval or time between taking each dose and first decrease the dose.  Cut the total daily intake of opioids by one tablet each day Next start to increase the time between doses. The last dose that should be eliminated is the evening dose.      TED hose   Complete by: As directed    Use stockings (TED hose) for three weeks on both leg(s).  You may remove them at night for sleeping.   Weight bearing as tolerated   Complete by: As directed       Allergies as of 07/20/2022       Reactions   Sulfa Antibiotics Hives   Codeine Nausea And Vomiting   Prednisone Other (See Comments)   Shuts down adrenals   Zolpidem Tartrate Other (See Comments)   sleepwalks   Adhesive [tape] Rash   blisters   Diltiazem Palpitations   Dizziness        Medication List     TAKE these medications    acetaminophen 650 MG CR tablet Commonly known  as: TYLENOL Take 1,300 mg by mouth 2 (two) times daily.   apixaban 2.5 MG Tabs tablet Commonly known as: ELIQUIS Take 1 tablet (2.5 mg total) by mouth 2 (two) times daily.   cholecalciferol 25 MCG (1000 UNIT) tablet Commonly known as: VITAMIN D3 Take 1,000 Units by mouth  daily.   cyanocobalamin 1000 MCG tablet Commonly known as: VITAMIN B12 Take 2,000 mcg by mouth daily.   ferrous sulfate 325 (65 FE) MG tablet Take 325 mg by mouth once a week.   furosemide 20 MG tablet Commonly known as: LASIX Take 40 mg by mouth in the morning.   gabapentin 100 MG capsule Commonly known as: NEURONTIN Take 200 mg by mouth at bedtime. RLS   HYDROmorphone 2 MG tablet Commonly known as: DILAUDID Take 0.5-1 tablets (1-2 mg total) by mouth every 6 (six) hours as needed for moderate pain or severe pain.   losartan 100 MG tablet Commonly known as: COZAAR Take 100 mg by mouth in the morning.   methocarbamol 500 MG tablet Commonly known as: ROBAXIN Take 1 tablet (500 mg total) by mouth every 6 (six) hours as needed for muscle spasms.   metoprolol tartrate 25 MG tablet Commonly known as: LOPRESSOR Take 25 mg by mouth 1 day or 1 dose.   ondansetron 4 MG tablet Commonly known as: ZOFRAN Take 1 tablet (4 mg total) by mouth every 6 (six) hours as needed for nausea.   traMADol 50 MG tablet Commonly known as: ULTRAM Take 1-2 tablets (50-100 mg total) by mouth every 6 (six) hours as needed for moderate pain.   trolamine salicylate 10 % cream Commonly known as: ASPERCREME Apply 1 Application topically as needed for muscle pain.   zinc gluconate 50 MG tablet Take 50 mg by mouth daily.               Discharge Care Instructions  (From admission, onward)           Start     Ordered   07/20/22 0000  Weight bearing as tolerated        07/20/22 0844   07/20/22 0000  Change dressing       Comments: You may remove the bulky bandage (ACE wrap and gauze) two days after surgery. You will have an adhesive waterproof bandage underneath. Leave this in place until your first follow-up appointment.   07/20/22 0844            Follow-up Information     Ollen Gross, MD. Go on 08/04/2022.   Specialty: Orthopedic Surgery Why: You are scheduled for a follow up  appointment on 08-04-22 at 2:15 pm. Contact information: [address] [place] [address] [place] [address] [phone]                 Signed: Arther Abbott, PA-C Orthopedic Surgery 07/20/2022, 8:44 AM

## 2022-07-20 NOTE — [place] FL2 (Signed)
[place] MEDICAID FL2 LEVEL OF CARE FORM     IDENTIFICATION  Patient Name: Nancy Baxter Birthdate: 28-Nov-1940 Sex: female Admission Date (Current Location): 07/19/2022  [facility] and [place] Number:  Producer, television/film/video and Address:  [facility],  [address] [place], [place] [address]      Provider Number: [id number]  Attending Physician Name and Address:  Ollen Gross, MD  Relative Name and Phone Number:  daughterInda Coke @ [phone]    Current Level of Care: Hospital Recommended Level of Care: Skilled Nursing Facility Prior Approval Number:    Date Approved/Denied:   PASRR Number:    Discharge Plan: SNF    Current Diagnoses: Patient Active Problem List   Diagnosis Date Noted   OA (osteoarthritis) of knee 07/19/2022   Primary osteoarthritis of right knee 07/19/2022   Nonrheumatic mitral valve regurgitation 01/13/2022   Nonrheumatic aortic valve insufficiency 01/13/2022   Stage 3b chronic kidney disease 01/13/2022   Paroxysmal atrial fibrillation 12/09/2021   PVCs (premature ventricular contractions) 12/08/2021   Elevated troponin 12/07/2021   Recurrent dislocation of left hip 08/06/2020   Essential hypertension 04/30/2020   Chronic kidney disease, stage 3a 05/28/2019   Peripheral venous insufficiency 10/28/2014   Restless legs syndrome (RLS) 12/25/2012   Lumbosacral root lesions, not elsewhere classified 12/25/2012   Failed total hip arthroplasty 01/05/2012   Gastro-esophageal reflux disease without esophagitis 07/08/2009   Benign paroxysmal positional vertigo 04/15/2009    Orientation RESPIRATION BLADDER Height & Weight     Self, Time, Situation, Place  Normal Continent Weight: 122 lb (55.3 kg) Height:  4' 10.5" (148.6 cm)  BEHAVIORAL SYMPTOMS/MOOD NEUROLOGICAL BOWEL NUTRITION STATUS      Continent Diet (Regular)  AMBULATORY STATUS COMMUNICATION OF NEEDS Skin   Limited Assist Verbally Other (Comment) (surgical incision only)                        Personal Care Assistance Level of Assistance  Bathing Bathing Assistance: Limited assistance   Dressing Assistance: Independent     Functional Limitations Info  Sight, Hearing, Speech Sight Info: Adequate Hearing Info: Adequate Speech Info: Adequate    SPECIAL CARE FACTORS FREQUENCY  PT (By licensed PT), OT (By licensed OT)     PT Frequency: 5x/wk OT Frequency: 5x/wk            Contractures Contractures Info: Not present    Additional Factors Info  Code Status, Allergies Code Status Info: Full Allergies Info: Sulfa Antibiotics, Codeine, Prednisone, Zolpidem Tartrate, Adhesive (Tape), Diltiazem           Current Medications (07/20/2022):  This is the current hospital active medication list Current Facility-Administered Medications  Medication Dose Route Frequency Provider Last Rate Last Admin   0.9 %  sodium chloride infusion   Intravenous Continuous Edmisten, Kristie L, PA 75 mL/hr at 07/20/22 0422 New Bag at 07/20/22 0422   acetaminophen (TYLENOL) tablet 1,000 mg  1,000 mg Oral Q6H Edmisten, Kristie L, PA   1,000 mg at 07/20/22 0548   apixaban (ELIQUIS) tablet 2.5 mg  2.5 mg Oral Q12H Edmisten, Kristie L, PA   2.5 mg at 07/20/22 0817   bisacodyl (DULCOLAX) suppository 10 mg  10 mg Rectal Daily PRN Edmisten, Kristie L, PA       diphenhydrAMINE (BENADRYL) 12.5 MG/5ML elixir 12.5-25 mg  12.5-25 mg Oral Q4H PRN Edmisten, Kristie L, PA       docusate sodium (COLACE) capsule 100 mg  100 mg  Oral BID Derenda Fennel, PA   100 mg at 07/20/22 4782   ferrous sulfate tablet 325 mg  325 mg Oral Weekly Edmisten, Kristie L, PA   325 mg at 07/20/22 0817   furosemide (LASIX) tablet 40 mg  40 mg Oral Daily Edmisten, Kristie L, PA   40 mg at 07/20/22 0818   gabapentin (NEURONTIN) capsule 200 mg  200 mg Oral QHS Edmisten, Kristie L, PA   200 mg at 07/19/22 2140   HYDROmorphone (DILAUDID) injection 0.5-1 mg  0.5-1 mg Intravenous Q4H PRN Edmisten, Kristie L, PA        HYDROmorphone (DILAUDID) tablet 1-2 mg  1-2 mg Oral Q4H PRN Edmisten, Kristie L, PA       losartan (COZAAR) tablet 100 mg  100 mg Oral Daily Edmisten, Kristie L, PA   100 mg at 07/20/22 0818   menthol-cetylpyridinium (CEPACOL) lozenge 3 mg  1 lozenge Oral PRN Edmisten, Kristie L, PA       Or   phenol (CHLORASEPTIC) mouth spray 1 spray  1 spray Mouth/Throat PRN Edmisten, Kristie L, PA       methocarbamol (ROBAXIN) tablet 500 mg  500 mg Oral Q6H PRN Edmisten, Kristie L, PA   500 mg at 07/20/22 0548   Or   methocarbamol (ROBAXIN) 500 mg in dextrose 5 % 50 mL IVPB  500 mg Intravenous Q6H PRN Edmisten, Kristie L, PA       metoCLOPramide (REGLAN) tablet 5-10 mg  5-10 mg Oral Q8H PRN Edmisten, Kristie L, PA       Or   metoCLOPramide (REGLAN) injection 5-10 mg  5-10 mg Intravenous Q8H PRN Edmisten, Kristie L, PA       metoprolol tartrate (LOPRESSOR) tablet 25 mg  25 mg Oral Daily Edmisten, Kristie L, PA   25 mg at 07/20/22 0817   ondansetron (ZOFRAN) tablet 4 mg  4 mg Oral Q6H PRN Edmisten, Kristie L, PA       Or   ondansetron (ZOFRAN) injection 4 mg  4 mg Intravenous Q6H PRN Edmisten, Kristie L, PA       Oral care mouth rinse  15 mL Mouth Rinse PRN Aluisio, Homero Fellers, MD       polyethylene glycol (MIRALAX / GLYCOLAX) packet 17 g  17 g Oral Daily PRN Edmisten, Kristie L, PA       sodium phosphate (FLEET) 7-19 GM/118ML enema 1 enema  1 enema Rectal Once PRN Edmisten, Kristie L, PA       traMADol (ULTRAM) tablet 50-100 mg  50-100 mg Oral Q6H PRN Edmisten, Kristie L, PA   100 mg at 07/20/22 9562     Discharge Medications: Please see discharge summary for a list of discharge medications.  Relevant Imaging Results:  Relevant Lab Results:   Additional Information SS# [id number]  Demarr Kluever, LCSW

## 2022-07-20 NOTE — Progress Notes (Signed)
Physical Therapy Treatment Patient Details Name: SHARLETTA BOLAN MRN: [id number] DOB: [birth year] Today's Date: 07/20/2022   History of Present Illness  82 yo female s/p R TKA 07/19/22. PMGH: afib, HTN, CKD, mitral valve regurgitation, bil THA     PT Comments    POD #1 am session Assisted pt out of recliner, where pt wanted to demonstrate the lack of quad lag in their straight leg raises. Assisted to ambulate in hallway 80 ft. Assisted pt to EOB to lay supine. Educated and competed ankle pumps, heel slides, short arc quads, and straight leg raise exercises. Addressed all mobility questions, discussed appropriate activity, educated on use of ICE. Continue PT POC.    Recommendations for follow up therapy are one component of a multi-disciplinary discharge planning process, led by the attending physician.  Recommendations may be updated based on patient status, additional functional criteria and insurance authorization.  Follow Up Recommendations       Assistance Recommended at Discharge    Patient can return home with the following     Equipment Recommendations       Recommendations for Other Services       Precautions / Restrictions Precautions Precautions: Fall;Knee Restrictions Weight Bearing Restrictions: No Other Position/Activity Restrictions: WBAT     Mobility  Bed Mobility Overal bed mobility: Needs Assistance Bed Mobility: Sit to Supine     Supine to sit: Supervision     General bed mobility comments: for safety    Transfers Overall transfer level: Needs assistance Equipment used: Rolling walker (2 wheels) Transfers: Sit to/from Stand Sit to Stand: Min guard                Ambulation/Gait Ambulation/Gait assistance: Min Chemical engineer (Feet): 80 Feet Assistive device: Rolling walker (2 wheels) Gait Pattern/deviations: Step-through pattern, Decreased stance time - right Gait velocity: decreased         Stairs              Wheelchair Mobility    Modified Rankin (Stroke Patients Only)       Balance                                            Cognition Arousal/Alertness: Awake/alert Behavior During Therapy: WFL for tasks assessed/performed Overall Cognitive Status: Within Functional Limits for tasks assessed                                          Exercises Total Joint Exercises Ankle Circles/Pumps: AROM, Both, 10 reps, Supine Quad Sets: 5 reps, Right, AAROM, Supine Short Arc Quad: AAROM, Right, 5 reps, Supine Heel Slides: AAROM, Right, 5 reps, Supine Straight Leg Raises: AROM, Right, 5 reps, Supine    General Comments        Pertinent Vitals/Pain Pain Assessment Pain Assessment: 0-10 Pain Score: 3  Pain Location: right knee Pain Descriptors / Indicators: Aching, Discomfort, Sore Pain Intervention(s): Monitored during session, Ice applied, Repositioned    Home Living                          Prior Function            PT Goals (current goals can now be found in the care plan section) Acute Rehab  PT Goals Patient Stated Goal: home, less knee pain PT Goal Formulation: With patient Time For Goal Achievement: 07/26/22 Potential to Achieve Goals: Good Progress towards PT goals: Progressing toward goals    Frequency           PT Plan      Co-evaluation              AM-PAC PT "6 Clicks" Mobility   Outcome Measure                   End of Session Equipment Utilized During Treatment: Gait belt Activity Tolerance: Patient tolerated treatment well Patient left: in bed;with call bell/phone within reach;with bed alarm set         Time:  -     Charges:                        Sharlene Motts, SPTA

## 2022-07-20 NOTE — Progress Notes (Unsigned)
Provider:   [facility] Place of Service:  SNF (31)  PCP: Creola Cornusso, John, MD Patient Care Team: Creola Cornusso, John, MD as PCP - General (Internal Medicine) Jodelle Redhristopher, Bridgette, MD as PCP - Cardiology (Cardiology)  Extended Emergency Contact Information Primary Emergency Contact: maker,champayne Home Phone: [phone] Mobile Phone: [phone] Relation: Daughter Secondary Emergency Contact: Illene BolusJames,Jeff  [place] Mobile Phone: [phone] Relation: Son  Code Status: Full Code Goals of Care: Advanced Directive information    07/20/2022    3:31 PM  Advanced Directives  Does Patient Have a Medical Advance Directive? Yes  Type of Estate agentAdvance Directive Healthcare Power of Ash[place]orney;Living will  Does patient want to make changes to medical advance directive? No - Patient declined  Copy of Healthcare Power of Attorney in Chart? No - copy requested      Chief Complaint  Patient presents with   New Admit To SNF    Patient is a being seen for admit to SNF    HPI: Patient is a 82 y.o. female seen today for admission to Rehab after undergoing  Right TKA  by Dr Jolinda CroakAlliusio on 07/19/22  Patient has h/o PAF on Eliquis CKD, Hypertension,Hyponatremia, Thrombocytopenia, Mitral Valve Insufficiency, Arthritis, Restless Legs Syndrome, Peripheral Edema  Admitted for elective surgery per Dr Lequita HaltAluisio Post op uneventful Already walking with mild Assist Pain is controlled Does not want to take Dilaudid Constipated but does not want to take anything for it for now  [facility] Very Independent Daughter and Son in the room  Past Medical History:  Diagnosis Date   A-fib    Anemia    pt. denies   Arthritis    GERD (gastroesophageal reflux disease)    Heart murmur    evaluated 7225yr ago-no cardiac follow up needed   Hypertension    Postop Acute blood loss anemia 01/06/2012   Postop  Hyponatremia 01/06/2012   Postop Transfusion 01/07/2012   Restless leg    Past Surgical History:  Procedure Laterality Date   BACK SURGERY  1961   spinal fusion age    CARPAL TUNNEL RELEASE Left 05/18/2016   Procedure: LEFT CARPAL TUNNEL RELEASE;  Surgeon: Cindee SaltGary Kuzma, MD;  Location: [facility];  Service: Orthopedics;  Laterality: Left;   EAR CYST EXCISION  02/23/2011   pt. denies   FOOT ARTHRODESIS  5/12   hammer toes,bunionectomy   JOINT REPLACEMENT  2006   rt hip   JOINT REPLACEMENT  2008   lt hip   TONSILLECTOMY     age 82   TOTAL HIP REVISION  01/05/2012   Procedure: TOTAL HIP REVISION;  Surgeon: Loanne DrillingFrank V Aluisio, MD;  Location: [facility];  Service: Orthopedics;  Laterality: Left;   TOTAL HIP REVISION Left 08/06/2020   Procedure: Left hip bearing surface revision;  Surgeon: Ollen GrossAluisio, Frank, MD;  Location: [facility];  Service: Orthopedics;  Laterality: Left;  90min   TRIGGER FINGER RELEASE  02/23/2011   Procedure: RELEASE TRIGGER FINGER/A-1 PULLEY;  Surgeon: Nicki ReaperGary R Kuzma, MD;  Location: [facility];  Service: Orthopedics;  Laterality: Right;   TRIGGER FINGER RELEASE Left 07/26/2012   Procedure: RELEASE TRIGGER FINGER/A-1 PULLEY LEFT SMALL FINGER;  Surgeon: Nicki ReaperGary R Kuzma, MD;  Location: [facility];  Service: Orthopedics;  Laterality: Left;   TRIGGER FINGER RELEASE Left 05/18/2016   Procedure: RELEASE TRIGGER FINGER/A-1 PULLEY LEFT INDEX;  Surgeon: Cindee SaltGary Kuzma, MD;  Location: [facility];  Service: Orthopedics;  Laterality: Left;   VAGINAL HYSTERECTOMY      reports that she has never smoked. She has never used smokeless tobacco. She reports current alcohol use of about 1.0 standard drink of alcohol per week. She reports that she does not use drugs. Social History   Socioeconomic History   Marital status: Widowed    Spouse name: Morrie Sheldon    Number of children: 5   Years of education: College   Highest education level: Not on file   Occupational History    Comment: retired  Tobacco Use   Smoking status: Never   Smokeless tobacco: Never  Vaping Use   Vaping Use: Never used  Substance and Sexual Activity   Alcohol use: Yes    Alcohol/week: 1.0 standard drink of alcohol    Types: 1 Glasses of wine per week    Comment: one a day   Drug use: No   Sexual activity: Not Currently  Other Topics Concern   Not on file  Social History Narrative   Patient is retired and married. Morrie Sheldon)   Patient has some college education.    Patient has 5 children.    Right handed   Caffeine two cups of caffeine daily ( coffee)   Social Determinants of Health   Financial Resource Strain: Not on file  Food Insecurity: No Food Insecurity (07/19/2022)   Hunger Vital Sign    Worried About Running Out of Food in the Last Year: Never true    Ran Out of Food in the Last Year: Never true  Transportation Needs: No Transportation Needs (07/19/2022)   PRAPARE - Administrator, Civil Service (Medical): No    Lack of Transportation (Non-Medical): No  Physical Activity: Not on file  Stress: Not on file  Social Connections: Not on file  Intimate Partner Violence: Not At Risk (07/19/2022)   Humiliation, Afraid, Rape, and Kick questionnaire    Fear of Current or Ex-Partner: No    Emotionally Abused: No    Physically Abused: No    Sexually Abused: No    Functional Status Survey:    Family History  Problem Relation Age of Onset   Colon cancer Neg Hx    Esophageal cancer Neg Hx    Rectal cancer Neg Hx    Stomach cancer Neg Hx     Health Maintenance  Topic Date Due   DTaP/Tdap/Td (1 - Tdap) Never done   Zoster Vaccines- Shingrix (1 of 2) Never done   Pneumonia Vaccine 61+ Years old (1 of 1 - PCV) Never done   DEXA SCAN  Never done   COLONOSCOPY (Pts 45-53yrs Insurance coverage will need to be confirmed)  08/24/2016   Medicare Annual Wellness (AWV)  12/05/2021   COVID-19 Vaccine (6 - 2023-24 season) 12/11/2021   INFLUENZA  VACCINE  11/11/2022   HPV VACCINES  Aged Out    Allergies  Allergen Reactions   Sulfa Antibiotics Hives   Codeine Nausea And Vomiting   Prednisone Other (See Comments)    Shuts down adrenals   Zolpidem Tartrate Other (See Comments)    sleepwalks   Adhesive [Tape] Rash    blisters   Diltiazem Palpitations    Dizziness    Outpatient Encounter Medications as of 07/20/2022  Medication Sig   acetaminophen (TYLENOL) 650 MG CR tablet Take 1,300 mg by mouth 2 (two) times daily.   apixaban (ELIQUIS) 2.5 MG TABS tablet Take 1 tablet (2.5 mg total) by  mouth 2 (two) times daily.   gabapentin (NEURONTIN) 100 MG capsule Take 200 mg by mouth at bedtime. RLS   HYDROmorphone (DILAUDID) 2 MG tablet Take 0.5-1 tablets (1-2 mg total) by mouth every 6 (six) hours as needed for moderate pain or severe pain.   methocarbamol (ROBAXIN) 500 MG tablet Take 1 tablet (500 mg total) by mouth every 6 (six) hours as needed for muscle spasms.   ondansetron (ZOFRAN) 4 MG tablet Take 1 tablet (4 mg total) by mouth every 6 (six) hours as needed for nausea.   traMADol (ULTRAM) 50 MG tablet Take 1-2 tablets (50-100 mg total) by mouth every 6 (six) hours as needed for moderate pain.   trolamine salicylate (ASPERCREME) 10 % cream Apply 1 Application topically as needed for muscle pain.   zinc gluconate 50 MG tablet Take 50 mg by mouth daily.   cholecalciferol (VITAMIN D3) 25 MCG (1000 UNIT) tablet Take 1,000 Units by mouth daily. (Patient not taking: Reported on 07/20/2022)   cyanocobalamin (VITAMIN B12) 1000 MCG tablet Take 2,000 mcg by mouth daily. (Patient not taking: Reported on 07/20/2022)   ferrous sulfate 325 (65 FE) MG tablet Take 325 mg by mouth once a week. (Patient not taking: Reported on 07/20/2022)   furosemide (LASIX) 20 MG tablet Take 40 mg by mouth in the morning. (Patient not taking: Reported on 07/20/2022)   losartan (COZAAR) 100 MG tablet Take 100 mg by mouth in the morning. (Patient not taking: Reported on  07/20/2022)   metoprolol tartrate (LOPRESSOR) 25 MG tablet Take 25 mg by mouth 1 day or 1 dose. (Patient not taking: Reported on 07/20/2022)   Facility-Administered Encounter Medications as of 07/20/2022  Medication   0.9 %  sodium chloride infusion   apixaban (ELIQUIS) tablet 2.5 mg   bisacodyl (DULCOLAX) suppository 10 mg   diphenhydrAMINE (BENADRYL) 12.5 MG/5ML elixir 12.5-25 mg   docusate sodium (COLACE) capsule 100 mg   ferrous sulfate tablet 325 mg   furosemide (LASIX) tablet 40 mg   gabapentin (NEURONTIN) capsule 200 mg   HYDROmorphone (DILAUDID) injection 0.5-1 mg   HYDROmorphone (DILAUDID) tablet 1-2 mg   losartan (COZAAR) tablet 100 mg   menthol-cetylpyridinium (CEPACOL) lozenge 3 mg   Or   phenol (CHLORASEPTIC) mouth spray 1 spray   methocarbamol (ROBAXIN) tablet 500 mg   Or   methocarbamol (ROBAXIN) 500 mg in dextrose 5 % 50 mL IVPB   metoCLOPramide (REGLAN) tablet 5-10 mg   Or   metoCLOPramide (REGLAN) injection 5-10 mg   metoprolol tartrate (LOPRESSOR) tablet 25 mg   ondansetron (ZOFRAN) tablet 4 mg   Or   ondansetron (ZOFRAN) injection 4 mg   Oral care mouth rinse   polyethylene glycol (MIRALAX / GLYCOLAX) packet 17 g   sodium phosphate (FLEET) 7-19 GM/118ML enema 1 enema   traMADol (ULTRAM) tablet 50-100 mg    Review of Systems  Constitutional:  Negative for activity change and appetite change.  HENT: Negative.    Respiratory:  Negative for cough and shortness of breath.   Cardiovascular:  Negative for leg swelling.  Gastrointestinal:  Positive for constipation.  Genitourinary: Negative.   Musculoskeletal:  Positive for gait problem. Negative for arthralgias and myalgias.  Skin: Negative.   Neurological:  Negative for dizziness and weakness.  Psychiatric/Behavioral:  Negative for confusion, dysphoric mood and sleep disturbance.     Vitals:   07/20/22 1507  BP: (!) 146/68  Pulse: 71  Resp: (!) 21  Temp: (!) 97.3 F (36.3 C)  TempSrc:  Temporal  SpO2:  98%  Height: 4' 10.5" (1.486 m)   Body mass index is 25.06 kg/m. Physical Exam Vitals reviewed.  Constitutional:      Appearance: Normal appearance.  HENT:     Head: Normocephalic.     Nose: Nose normal.     Mouth/Throat:     Mouth: Mucous membranes are moist.     Pharynx: Oropharynx is clear.  Eyes:     Pupils: Pupils are equal, round, and reactive to light.  Cardiovascular:     Rate and Rhythm: Normal rate and regular rhythm.     Pulses: Normal pulses.     Heart sounds: Murmur heard.  Pulmonary:     Effort: Pulmonary effort is normal.     Breath sounds: Normal breath sounds.  Abdominal:     General: Abdomen is flat. Bowel sounds are normal.     Palpations: Abdomen is soft.  Musculoskeletal:        General: No swelling.     Cervical back: Neck supple.  Skin:    General: Skin is warm.  Neurological:     General: No focal deficit present.     Mental Status: She is alert and oriented to person, place, and time.  Psychiatric:        Mood and Affect: Mood normal.        Thought Content: Thought content normal.     Labs reviewed: Basic Metabolic Panel: Recent Labs    12/07/21 1837 12/08/21 1837 12/09/21 0503 07/08/22 1342 07/20/22 0343  NA  --    < > 136 138 134*  K  --    < > 4.0 4.2 4.1  CL  --    < > 106 104 103  CO2  --    < > 25 26 22   GLUCOSE  --    < > 90 108* 118*  BUN  --    < > 35* 47* 35*  CREATININE  --    < > 1.65* 1.59* 1.57*  CALCIUM  --    < > 9.1 9.3 8.5*  MG 2.0  --   --   --   --    < > = values in this interval not displayed.   Liver Function Tests: No results for input(s): "AST", "ALT", "ALKPHOS", "BILITOT", "PROT", "ALBUMIN" in the last 8760 hours. No results for input(s): "LIPASE", "AMYLASE" in the last 8760 hours. No results for input(s): "AMMONIA" in the last 8760 hours. CBC: Recent Labs    12/09/21 0503 07/08/22 1342 07/20/22 0343  WBC 6.0 6.6 10.1  HGB 12.9 13.8 11.3*  HCT 38.0 41.7 32.9*  MCV 95.5 95.2 94.5  PLT 134*  144* 125*   Cardiac Enzymes: No results for input(s): "CKTOTAL", "CKMB", "CKMBINDEX", "TROPONINI" in the last 8760 hours. BNP: Invalid input(s): "POCBNP" No results found for: "HGBA1C" Lab Results  Component Value Date   TSH 2.631 12/07/2021   No results found for: "VITAMINB12" No results found for: "FOLATE" Lab Results  Component Value Date   IRON 107 05/01/2019   TIBC 297 05/01/2019   FERRITIN 150 05/01/2019    Imaging and Procedures obtained prior to SNF admission: No results found.  Assessment/Plan 1. S/P total knee arthroplasty, right Discontinue Dilaudid per her request Pain Control with Robaxin,Tylenol And Tramadol Therapy WBAT Already on Eliquis  2. Essential hypertension On Toprol, Losartan  3. Paroxysmal atrial fibrillation Toprol and Eliquis  4. Nonrheumatic mitral valve regurgitation   5. Gastro-esophageal reflux disease without esophagitis  6. Bilateral leg edema On Lasix  7. Stage 3b chronic kidney disease Creat stable  8. Other iron deficiency anemia Good Iron stores On iron and B12     Family/ staff Communication:   Labs/tests ordered:

## 2022-07-21 ENCOUNTER — Emergency Department (HOSPITAL_COMMUNITY)
Admission: EM | Admit: 2022-07-21 | Discharge: 2022-07-22 | Disposition: A | Discharge status: Home / Self Care | Payer: PPO | Attending: Emergency Medicine | Admitting: Emergency Medicine

## 2022-07-21 ENCOUNTER — Other Ambulatory Visit: Payer: Self-pay

## 2022-07-21 ENCOUNTER — Encounter (HOSPITAL_COMMUNITY): Payer: Self-pay

## 2022-07-21 DIAGNOSIS — R2689 Other abnormalities of gait and mobility: Secondary | ICD-10-CM | POA: Diagnosis not present

## 2022-07-21 DIAGNOSIS — I4891 Unspecified atrial fibrillation: Secondary | ICD-10-CM | POA: Diagnosis not present

## 2022-07-21 DIAGNOSIS — I480 Paroxysmal atrial fibrillation: Secondary | ICD-10-CM | POA: Insufficient documentation

## 2022-07-21 DIAGNOSIS — Z7901 Long term (current) use of anticoagulants: Secondary | ICD-10-CM | POA: Diagnosis not present

## 2022-07-21 DIAGNOSIS — Z96651 Presence of right artificial knee joint: Secondary | ICD-10-CM | POA: Diagnosis not present

## 2022-07-21 DIAGNOSIS — R000 Tachycardia, unspecified: Secondary | ICD-10-CM | POA: Diagnosis not present

## 2022-07-21 DIAGNOSIS — M7989 Other specified soft tissue disorders: Secondary | ICD-10-CM | POA: Diagnosis not present

## 2022-07-21 DIAGNOSIS — I491 Atrial premature depolarization: Secondary | ICD-10-CM | POA: Diagnosis not present

## 2022-07-21 DIAGNOSIS — R509 Fever, unspecified: Secondary | ICD-10-CM | POA: Insufficient documentation

## 2022-07-21 DIAGNOSIS — M62561 Muscle wasting and atrophy, not elsewhere classified, right lower leg: Secondary | ICD-10-CM | POA: Diagnosis not present

## 2022-07-21 LAB — CBC WITH DIFFERENTIAL/PLATELET
Abs Immature Granulocytes: 0.17 10*3/uL — ABNORMAL HIGH (ref 0.00–0.07)
Basophils Absolute: 0 10*3/uL (ref 0.0–0.1)
Basophils Relative: 0 %
Eosinophils Absolute: 0.1 10*3/uL (ref 0.0–0.5)
Eosinophils Relative: 1 %
HCT: 32.7 % — ABNORMAL LOW (ref 36.0–46.0)
Hemoglobin: 11 g/dL — ABNORMAL LOW (ref 12.0–15.0)
Immature Granulocytes: 2 %
Lymphocytes Relative: 18 %
Lymphs Abs: 2 10*3/uL (ref 0.7–4.0)
MCH: 31.3 pg (ref 26.0–34.0)
MCHC: 33.6 g/dL (ref 30.0–36.0)
MCV: 93.2 fL (ref 80.0–100.0)
Monocytes Absolute: 0.9 10*3/uL (ref 0.1–1.0)
Monocytes Relative: 8 %
Neutro Abs: 7.5 10*3/uL (ref 1.7–7.7)
Neutrophils Relative %: 71 %
Platelets: 135 10*3/uL — ABNORMAL LOW (ref 150–400)
RBC: 3.51 MIL/uL — ABNORMAL LOW (ref 3.87–5.11)
RDW: 13.2 % (ref 11.5–15.5)
WBC: 10.7 10*3/uL — ABNORMAL HIGH (ref 4.0–10.5)
nRBC: 0 % (ref 0.0–0.2)

## 2022-07-21 LAB — BASIC METABOLIC PANEL
Anion gap: 9 (ref 5–15)
BUN: 30 mg/dL — ABNORMAL HIGH (ref 8–23)
CO2: 24 mmol/L (ref 22–32)
Calcium: 8.4 mg/dL — ABNORMAL LOW (ref 8.9–10.3)
Chloride: 95 mmol/L — ABNORMAL LOW (ref 98–111)
Creatinine, Ser: 1.52 mg/dL — ABNORMAL HIGH (ref 0.44–1.00)
GFR, Estimated: 34 mL/min — ABNORMAL LOW (ref >60)
Glucose, Bld: 113 mg/dL — ABNORMAL HIGH (ref 70–99)
Potassium: 3.6 mmol/L (ref 3.5–5.1)
Sodium: 128 mmol/L — ABNORMAL LOW (ref 135–145)

## 2022-07-21 LAB — MAGNESIUM: Magnesium: 1.8 mg/dL (ref 1.7–2.4)

## 2022-07-21 NOTE — ED Triage Notes (Signed)
BIB EMS from [facility] for abnormal heart rate throughout the day. HX of afib. HR was WNL with EMS. Rehab facility stated that HR was up to 130s at one point during the day. Pt denies any sx.  Recent left knee replacement 3 days ago.

## 2022-07-21 NOTE — ED Provider Triage Note (Signed)
Emergency Medicine Provider Triage Evaluation Note  Nancy Baxter , a 82 y.o. female  was evaluated in triage.  Pt complains of right knee pain.  Patient had a knee replacement on 4/8.  No fever or chills.  Patient was found to have an elevated heart rate today in the 130s.  Patient has a history of paroxysmal A-fib.  Was last in A-fib in August.  She has been compliant with her Eliquis. Denies CP or SOB.  Review of Systems  Positive: Knee pain Negative: fever  Physical Exam  There were no vitals taken for this visit. Gen:   Awake, no distress   Resp:  Normal effort  MSK:   Moves extremities without difficulty  Other:    Medical Decision Making  Medically screening exam initiated at 7:47 PM.  Appropriate orders placed.  Nancy Baxter was informed that the remainder of the evaluation will be completed by another provider, this initial triage assessment does not replace that evaluation, and the importance of remaining in the ED until their evaluation is complete.  Labs  EKG   Mannie Stabile, [place] 07/21/22 1949

## 2022-07-22 ENCOUNTER — Emergency Department (HOSPITAL_COMMUNITY): Payer: PPO

## 2022-07-22 DIAGNOSIS — Z96651 Presence of right artificial knee joint: Secondary | ICD-10-CM | POA: Diagnosis not present

## 2022-07-22 DIAGNOSIS — R509 Fever, unspecified: Secondary | ICD-10-CM | POA: Diagnosis not present

## 2022-07-22 DIAGNOSIS — R000 Tachycardia, unspecified: Secondary | ICD-10-CM | POA: Diagnosis not present

## 2022-07-22 DIAGNOSIS — M25661 Stiffness of right knee, not elsewhere classified: Secondary | ICD-10-CM | POA: Diagnosis not present

## 2022-07-22 DIAGNOSIS — R2689 Other abnormalities of gait and mobility: Secondary | ICD-10-CM | POA: Diagnosis not present

## 2022-07-22 DIAGNOSIS — M25561 Pain in right knee: Secondary | ICD-10-CM | POA: Diagnosis not present

## 2022-07-22 DIAGNOSIS — M62561 Muscle wasting and atrophy, not elsewhere classified, right lower leg: Secondary | ICD-10-CM | POA: Diagnosis not present

## 2022-07-22 LAB — URINALYSIS, W/ REFLEX TO CULTURE (INFECTION SUSPECTED)
Bilirubin Urine: NEGATIVE
Glucose, UA: NEGATIVE mg/dL
Hgb urine dipstick: NEGATIVE
Ketones, ur: NEGATIVE mg/dL
Leukocytes,Ua: NEGATIVE
Nitrite: NEGATIVE
Protein, ur: NEGATIVE mg/dL
Specific Gravity, Urine: 1.011 (ref 1.005–1.030)
pH: 5 (ref 5.0–8.0)

## 2022-07-22 NOTE — ED Provider Notes (Signed)
[facility]MENT AT [facility] Provider Note   CSN: [id number] Arrival date & time: 07/21/22  1928     History  Chief Complaint  Patient presents with   Irregular Heart Beat    Nancy Baxter is a 82 y.o. female.  Patient presents to the emergency department for evaluation of rapid heartbeat.  Patient had a right total knee replacement 2 days ago.  Earlier today she had a heart rate in the 130s and a fever.  She was sent to the emergency department to be evaluated for infection.  Patient denies URI symptoms.  She was intubated for the procedure and had a Foley catheter in place.  No urinary symptoms.       Home Medications Prior to Admission medications   Medication Sig Start Date End Date Taking? Authorizing Provider  acetaminophen (TYLENOL) 650 MG CR tablet Take 1,300 mg by mouth 2 (two) times daily.    [provider]  apixaban (ELIQUIS) 2.5 MG TABS tablet Take 1 tablet (2.5 mg total) by mouth 2 (two) times daily. 01/04/22   Jodelle Red, MD  cholecalciferol (VITAMIN D3) 25 MCG (1000 UNIT) tablet Take 1,000 Units by mouth daily.    [provider]  cyanocobalamin (VITAMIN B12) 1000 MCG tablet Take 2,000 mcg by mouth daily.    [provider]  ferrous sulfate 325 (65 FE) MG tablet Take 325 mg by mouth once a week.    [provider]  furosemide (LASIX) 20 MG tablet Take 40 mg by mouth in the morning. 02/25/15   [provider]  gabapentin (NEURONTIN) 100 MG capsule Take 200 mg by mouth at bedtime. RLS    [provider]  losartan (COZAAR) 100 MG tablet Take 100 mg by mouth in the morning. 05/29/20   [provider]  methocarbamol (ROBAXIN) 500 MG tablet Take 1 tablet (500 mg total) by mouth every 6 (six) hours as needed for muscle spasms. 07/20/22   Edmisten, Kristie L, PA  metoprolol succinate (TOPROL-XL) 25 MG 24 hr tablet Take 25 mg by mouth daily.    [provider]   ondansetron (ZOFRAN) 4 MG tablet Take 1 tablet (4 mg total) by mouth every 6 (six) hours as needed for nausea. 07/20/22   Edmisten, Lyn Hollingshead, PA  traMADol (ULTRAM) 50 MG tablet Take 1-2 tablets (50-100 mg total) by mouth every 6 (six) hours as needed for moderate pain. 07/20/22   Edmisten, Kristie L, PA  trolamine salicylate (ASPERCREME) 10 % cream Apply 1 Application topically as needed for muscle pain.    [provider]  zinc gluconate 50 MG tablet Take 50 mg by mouth daily.    [provider]      Allergies    Sulfa antibiotics, Codeine, Prednisone, Zolpidem tartrate, Adhesive [tape], and Diltiazem    Review of Systems   Review of Systems  Physical Exam Updated Vital Signs BP (!) 158/72 (BP Location: Left Arm)   Pulse 81   Temp 98.9 F (37.2 C) (Oral)   Resp 18   Ht 4' 10.5" (1.486 m)   Wt 55.3 kg   SpO2 98%   BMI 25.05 kg/m  Physical Exam Vitals and nursing note reviewed.  Constitutional:      General: She is not in acute distress.    Appearance: She is well-developed.  HENT:     Head: Normocephalic and atraumatic.     Mouth/Throat:     Mouth: Mucous membranes are moist.  Eyes:  General: Vision grossly intact. Gaze aligned appropriately.     Extraocular Movements: Extraocular movements intact.     Conjunctiva/sclera: Conjunctivae normal.  Cardiovascular:     Rate and Rhythm: Normal rate and regular rhythm.     Pulses: Normal pulses.     Heart sounds: Normal heart sounds, S1 normal and S2 normal. No murmur heard.    No friction rub. No gallop.  Pulmonary:     Effort: Pulmonary effort is normal. No respiratory distress.     Breath sounds: Normal breath sounds.  Abdominal:     General: Bowel sounds are normal.     Palpations: Abdomen is soft.     Tenderness: There is no abdominal tenderness. There is no guarding or rebound.     Hernia: No hernia is present.  Musculoskeletal:        General: No swelling.     Cervical back: Full passive range of  motion without pain, normal range of motion and neck supple. No spinous process tenderness or muscular tenderness. Normal range of motion.     Right knee: Swelling present. No erythema or ecchymosis. Decreased range of motion. Tenderness present.     Right lower leg: No edema.     Left lower leg: No edema.     Comments: Surgical dressing in place, no drainage or bleeding.  Skin:    General: Skin is warm and dry.     Capillary Refill: Capillary refill takes less than 2 seconds.     Findings: No ecchymosis, erythema, rash or wound.  Neurological:     General: No focal deficit present.     Mental Status: She is alert and oriented to person, place, and time.     GCS: GCS eye subscore is 4. GCS verbal subscore is 5. GCS motor subscore is 6.     Cranial Nerves: Cranial nerves 2-12 are intact.     Sensory: Sensation is intact.     Motor: Motor function is intact.     Coordination: Coordination is intact.  Psychiatric:        Attention and Perception: Attention normal.        Mood and Affect: Mood normal.        Speech: Speech normal.        Behavior: Behavior normal.     ED Results / Procedures / Treatments   Labs (all labs ordered are listed, but only abnormal results are displayed) Labs Reviewed  CBC WITH DIFFERENTIAL/PLATELET - Abnormal; Notable for the following components:      Result Value   WBC 10.7 (*)    RBC 3.51 (*)    Hemoglobin 11.0 (*)    HCT 32.7 (*)    Platelets 135 (*)    Abs Immature Granulocytes 0.17 (*)    All other components within normal limits  BASIC METABOLIC PANEL - Abnormal; Notable for the following components:   Sodium 128 (*)    Chloride 95 (*)    Glucose, Bld 113 (*)    BUN 30 (*)    Creatinine, Ser 1.52 (*)    Calcium 8.4 (*)    GFR, Estimated 34 (*)    All other components within normal limits  URINALYSIS, W/ REFLEX TO CULTURE (INFECTION SUSPECTED) - Abnormal; Notable for the following components:   Bacteria, UA RARE (*)    All other  components within normal limits  MAGNESIUM    EKG EKG Interpretation  Date/Time:  Thursday July 22 2022 00:48:15 EDT Ventricular Rate:  75 PR  Interval:  190 QRS Duration: 80 QT Interval:  389 QTC Calculation: 435 R Axis:   31 Text Interpretation: Sinus rhythm Normal ECG Confirmed by Gilda Crease [id number]) on 07/22/2022 1:24:16 AM  Radiology DG Chest Port 1 View  Result Date: 07/22/2022 CLINICAL DATA:  Fever and tachycardia EXAM: PORTABLE CHEST 1 VIEW COMPARISON:  12/07/2021 FINDINGS: Cardiac shadow is within normal limits. Tortuous thoracic aorta is noted with calcifications. The lungs are clear bilaterally. No acute bony abnormality is noted. IMPRESSION: No acute abnormality noted. Electronically Signed   By: Alcide Clever M.D.   On: 07/22/2022 01:15    Procedures Procedures    Medications Ordered in ED Medications - No data to display  ED Course/ Medical Decision Making/ A&P                             Medical Decision Making Amount and/or Complexity of Data Reviewed Labs: ordered. Decision-making details documented in ED Course. Radiology: ordered and independent interpretation performed. Decision-making details documented in ED Course. ECG/medicine tests: ordered and independent interpretation performed. Decision-making details documented in ED Course.   Patient presents to the emergency department for evaluation of rapid heartbeat.  Differential diagnosis considered includes, but not limited to: Sinus tachycardia, atrial fibrillation with rapid ventricular response, SVT  Patient was noted to have a rapid heartbeat of 130 bpm earlier.  Patient does have a history of paroxysmal atrial fibrillation.  Here in the ED heart rate is in the 70s and 80s consistently.  She did have 1 vital signs with a heart rate of 131 but was not on a monitor and no EKG was captured at that time.  Patient most likely had an episode of atrial fibrillation which then spontaneously  converted to sinus rhythm.  She does not, however, report fever earlier.  She could have had sinus tachycardia secondary to the fever.  She is several days postop.  Surgical site of the right knee does not have any worrisome findings for infection.  No significant leukocytosis.  Vital signs otherwise normal, no other SIRS criteria.  chest x-ray without evidence of pneumonia.  Urinalysis without infection.  Patient is anticoagulated on Eliquis.  No further treatment required if this was A-fib with RVR.  No signs of sepsis on workup today, has follow-up with orthopedics tomorrow for recheck of the knee.        Final Clinical Impression(s) / ED Diagnoses Final diagnoses:  Paroxysmal atrial fibrillation  Fever, unspecified fever cause    Rx / DC Orders ED Discharge Orders     None         Gilda Crease, MD 07/23/22 [phone]

## 2022-07-23 ENCOUNTER — Non-Acute Institutional Stay (SKILLED_NURSING_FACILITY): Payer: PPO | Admitting: Adult Health

## 2022-07-23 ENCOUNTER — Encounter: Payer: Self-pay | Admitting: Adult Health

## 2022-07-23 DIAGNOSIS — I340 Nonrheumatic mitral (valve) insufficiency: Secondary | ICD-10-CM

## 2022-07-23 DIAGNOSIS — I480 Paroxysmal atrial fibrillation: Secondary | ICD-10-CM

## 2022-07-23 DIAGNOSIS — Z96651 Presence of right artificial knee joint: Secondary | ICD-10-CM | POA: Diagnosis not present

## 2022-07-23 DIAGNOSIS — I10 Essential (primary) hypertension: Secondary | ICD-10-CM

## 2022-07-23 DIAGNOSIS — E871 Hypo-osmolality and hyponatremia: Secondary | ICD-10-CM | POA: Diagnosis not present

## 2022-07-23 DIAGNOSIS — N1832 Chronic kidney disease, stage 3b: Secondary | ICD-10-CM

## 2022-07-23 NOTE — Progress Notes (Signed)
Location:  [facility] Room Number: 157A Place of Service:  SNF [phone])  Provider: Tally Joe  PCP: Creola Corn, MD Patient Care Team: Creola Corn, MD as PCP - General (Internal Medicine) Jodelle Red, MD as PCP - Cardiology (Cardiology)  Extended Emergency Contact Information Primary Emergency Contact: maker,champayne Home Phone: [phone] Mobile Phone: [phone] Relation: Daughter Secondary Emergency Contact: Illene Bolus [place] Mobile Phone: [phone] Relation: Son  Code Status: Full Code Goals of care:  Advanced Directive information    07/23/2022    9:21 AM  Advanced Directives  Does Patient Have a Medical Advance Directive? Yes  Type of Estate agent of [place];Living will  Does patient want to make changes to medical advance directive? No - Patient declined  Copy of Healthcare Power of Attorney in Chart? No - copy requested     Allergies  Allergen Reactions   Sulfa Antibiotics Hives   Codeine Nausea And Vomiting   Prednisone Other (See Comments)    Shuts down adrenals   Zolpidem Tartrate Other (See Comments)    sleepwalks   Adhesive [Tape] Rash    blisters   Diltiazem Palpitations    Dizziness    Chief Complaint  Patient presents with   Acute Visit    Patient is being seen for discharge   Health Maintenance    Discussed the need for bone density, AWV and colonoscopy.   Immunizations    Discuss the need for Pneumonia and Covid vaccine     HPI:  82 y.o. female seen for discharge from [facility] back to home Admitted after right total knee arthroplasty by Trudee Grip MD on 07/19/22 due to severe OA.  She has worked with therapy and has made gains. Still needs some assistance with transfers. Reports she feels ready to go home and will see PT next week. Has contact with home care if needed. Bowels are moving No issues with pain control, using  ultram  Seen in the ED on 07/21/22 for afib (rate was in the 130s at [facility]), low grade temp.  Work up unrevealing. EKG showed regular rhythm. BMP NA 128 otherwise normal CBC WBC 10.7 Hgb 11.0 UA neg CXR neg  Saw ortho one day ago with no new orders.  Reports she is ready for discharge Has not had any further palpitations. Felt she may have been nervous.  No further low grade temps Using IS getting up to 1250     Past Medical History:  Diagnosis Date   A-fib    Anemia    pt. denies   Arthritis    GERD (gastroesophageal reflux disease)    Heart murmur    evaluated 8yr ago-no cardiac follow up needed   Hypertension    Postop Acute blood loss anemia 01/06/2012   Postop Hyponatremia 01/06/2012   Postop Transfusion 01/07/2012   Restless leg     Past Surgical History:  Procedure Laterality Date   BACK SURGERY  1961   spinal fusion age    CARPAL TUNNEL RELEASE Left 05/18/2016   Procedure: LEFT CARPAL TUNNEL RELEASE;  Surgeon: Cindee Salt, MD;  Location: [facility];  Service: Orthopedics;  Laterality: Left;   EAR CYST EXCISION  02/23/2011   pt. denies   FOOT ARTHRODESIS  5/12   hammer toes,bunionectomy   JOINT REPLACEMENT  2006   rt hip   JOINT REPLACEMENT  2008   lt hip   TONSILLECTOMY     age 21  TOTAL HIP REVISION  01/05/2012   Procedure: TOTAL HIP REVISION;  Surgeon: Loanne Drilling, MD;  Location: [facility];  Service: Orthopedics;  Laterality: Left;   TOTAL HIP REVISION Left 08/06/2020   Procedure: Left hip bearing surface revision;  Surgeon: Ollen Gross, MD;  Location: [facility];  Service: Orthopedics;  Laterality: Left;    TOTAL KNEE ARTHROPLASTY Right 07/19/2022   Procedure: TOTAL KNEE ARTHROPLASTY;  Surgeon: Ollen Gross, MD;  Location: [facility];  Service: Orthopedics;  Laterality: Right;   TRIGGER FINGER RELEASE  02/23/2011   Procedure: RELEASE TRIGGER FINGER/A-1 PULLEY;  Surgeon: Nicki Reaper, MD;  Location: [facility];   Service: Orthopedics;  Laterality: Right;   TRIGGER FINGER RELEASE Left 07/26/2012   Procedure: RELEASE TRIGGER FINGER/A-1 PULLEY LEFT SMALL FINGER;  Surgeon: Nicki Reaper, MD;  Location: [facility];  Service: Orthopedics;  Laterality: Left;   TRIGGER FINGER RELEASE Left 05/18/2016   Procedure: RELEASE TRIGGER FINGER/A-1 PULLEY LEFT INDEX;  Surgeon: Cindee Salt, MD;  Location: [facility];  Service: Orthopedics;  Laterality: Left;   VAGINAL HYSTERECTOMY        reports that she has never smoked. She has never used smokeless tobacco. She reports current alcohol use of about 1.0 standard drink of alcohol per week. She reports that she does not use drugs. Social History   Socioeconomic History   Marital status: Widowed    Spouse name: Morrie Sheldon    Number of children: 5   Years of education: College   Highest education level: Not on file  Occupational History    Comment: retired  Tobacco Use   Smoking status: Never   Smokeless tobacco: Never  Vaping Use   Vaping Use: Never used  Substance and Sexual Activity   Alcohol use: Yes    Alcohol/week: 1.0 standard drink of alcohol    Types: 1 Glasses of wine per week    Comment: one a day   Drug use: No   Sexual activity: Not Currently  Other Topics Concern   Not on file  Social History Narrative   Patient is retired and married. Morrie Sheldon)   Patient has some college education.    Patient has 5 children.    Right handed   Caffeine two cups of caffeine daily ( coffee)   Social Determinants of Health   Financial Resource Strain: Not on file  Food Insecurity: No Food Insecurity (07/19/2022)   Hunger Vital Sign    Worried About Running Out of Food in the Last Year: Never true    Ran Out of Food in the Last Year: Never true  Transportation Needs: No Transportation Needs (07/19/2022)   PRAPARE - Administrator, Civil Service (Medical): No    Lack of Transportation (Non-Medical): No  Physical Activity: Not on  file  Stress: Not on file  Social Connections: Not on file  Intimate Partner Violence: Not At Risk (07/19/2022)   Humiliation, Afraid, Rape, and Kick questionnaire    Fear of Current or Ex-Partner: No    Emotionally Abused: No    Physically Abused: No    Sexually Abused: No   Functional Status Survey:    Allergies  Allergen Reactions   Sulfa Antibiotics Hives   Codeine Nausea And Vomiting   Prednisone Other (See Comments)    Shuts down adrenals   Zolpidem Tartrate Other (See Comments)    sleepwalks   Adhesive [Tape] Rash    blisters   Diltiazem Palpitations  Dizziness    Pertinent  Health Maintenance Due  Topic Date Due   DEXA SCAN  Never done   COLONOSCOPY (Pts 45-74yrs Insurance coverage will need to be confirmed)  08/24/2016   INFLUENZA VACCINE  11/11/2022    Medications: Outpatient Encounter Medications as of 07/23/2022  Medication Sig   acetaminophen (TYLENOL) 650 MG CR tablet Take 1,300 mg by mouth 2 (two) times daily.   apixaban (ELIQUIS) 2.5 MG TABS tablet Take 1 tablet (2.5 mg total) by mouth 2 (two) times daily.   cholecalciferol (VITAMIN D3) 25 MCG (1000 UNIT) tablet Take 1,000 Units by mouth daily.   cyanocobalamin (VITAMIN B12) 1000 MCG tablet Take 2,000 mcg by mouth daily.   furosemide (LASIX) 40 MG tablet Take 40 mg by mouth.   gabapentin (NEURONTIN) 100 MG capsule Take 200 mg by mouth at bedtime. RLS   losartan (COZAAR) 100 MG tablet Take 100 mg by mouth in the morning.   methocarbamol (ROBAXIN) 500 MG tablet Take 1 tablet (500 mg total) by mouth every 6 (six) hours as needed for muscle spasms.   metoprolol succinate (TOPROL-XL) 25 MG 24 hr tablet Take 25 mg by mouth daily.   ondansetron (ZOFRAN) 4 MG tablet Take 1 tablet (4 mg total) by mouth every 6 (six) hours as needed for nausea.   traMADol (ULTRAM) 50 MG tablet Take 1-2 tablets (50-100 mg total) by mouth every 6 (six) hours as needed for moderate pain.   trolamine salicylate (ASPERCREME) 10 % cream  Apply 1 Application topically as needed for muscle pain.   zinc gluconate 50 MG tablet Take 50 mg by mouth daily.   ferrous sulfate 325 (65 FE) MG tablet Take 325 mg by mouth once a week. (Patient not taking: Reported on 07/23/2022)   [DISCONTINUED] furosemide (LASIX) 20 MG tablet Take 40 mg by mouth in the morning.   No facility-administered encounter medications on file as of 07/23/2022.    Review of Systems  Constitutional:  Positive for activity change. Negative for appetite change, chills, diaphoresis, fatigue, fever and unexpected weight change.  HENT:  Negative for congestion.   Respiratory:  Negative for cough, shortness of breath and wheezing.   Cardiovascular:  Positive for leg swelling. Negative for chest pain and palpitations.  Gastrointestinal:  Negative for abdominal distention, abdominal pain, constipation and diarrhea.  Genitourinary:  Negative for difficulty urinating and dysuria.  Musculoskeletal:  Positive for gait problem. Negative for arthralgias, back pain, joint swelling and myalgias.  Skin:  Positive for wound.  Neurological:  Negative for dizziness, tremors, seizures, syncope, facial asymmetry, speech difficulty, weakness, light-headedness, numbness and headaches.  Psychiatric/Behavioral:  Negative for agitation, behavioral problems and confusion.     Vitals:   07/23/22 0903  BP: (!) 151/80  Pulse: 76  Resp: 14  Temp: 98.8 F (37.1 C)  TempSrc: Temporal  SpO2: 98%  Height: 4' 10.5" (1.486 m)   Body mass index is 25.05 kg/m. Physical Exam Vitals and nursing note reviewed.  Constitutional:      General: She is not in acute distress.    Appearance: She is not diaphoretic.  HENT:     Head: Normocephalic and atraumatic.  Neck:     Vascular: No JVD.  Cardiovascular:     Rate and Rhythm: Normal rate and regular rhythm.     Heart sounds: No murmur heard. Pulmonary:     Effort: Pulmonary effort is normal. No respiratory distress.     Breath sounds: Normal  breath sounds. No wheezing.  Musculoskeletal:     Comments: BLE trace  Skin:    General: Skin is warm and dry.  Neurological:     Mental Status: She is alert and oriented to person, place, and time.     Labs reviewed: Basic Metabolic Panel: Recent Labs    12/07/21 1837 12/08/21 1837 07/08/22 1342 07/20/22 0343 07/21/22 2000  NA  --    < > 138 134* 128*  K  --    < > 4.2 4.1 3.6  CL  --    < > 104 103 95*  CO2  --    < > 26 22 24   GLUCOSE  --    < > 108* 118* 113*  BUN  --    < > 47* 35* 30*  CREATININE  --    < > 1.59* 1.57* 1.52*  CALCIUM  --    < > 9.3 8.5* 8.4*  MG 2.0  --   --   --  1.8   < > = values in this interval not displayed.   Liver Function Tests: No results for input(s): "AST", "ALT", "ALKPHOS", "BILITOT", "PROT", "ALBUMIN" in the last 8760 hours. No results for input(s): "LIPASE", "AMYLASE" in the last 8760 hours. No results for input(s): "AMMONIA" in the last 8760 hours. CBC: Recent Labs    07/08/22 1342 07/20/22 0343 07/21/22 2000  WBC 6.6 10.1 10.7*  NEUTROABS  --   --  7.5  HGB 13.8 11.3* 11.0*  HCT 41.7 32.9* 32.7*  MCV 95.2 94.5 93.2  PLT 144* 125* 135*   Cardiac Enzymes: No results for input(s): "CKTOTAL", "CKMB", "CKMBINDEX", "TROPONINI" in the last 8760 hours. BNP: Invalid input(s): "POCBNP" CBG: No results for input(s): "GLUCAP" in the last 8760 hours.  Procedures and Imaging Studies During Stay: [facility] Chest Port 1 View  Result Date: 07/22/2022 CLINICAL DATA:  Fever and tachycardia EXAM: PORTABLE CHEST 1 VIEW COMPARISON:  12/07/2021 FINDINGS: Cardiac shadow is within normal limits. Tortuous thoracic aorta is noted with calcifications. The lungs are clear bilaterally. No acute bony abnormality is noted. IMPRESSION: No acute abnormality noted. Electronically Signed   By: Alcide Clever M.D.   On: 07/22/2022 01:15    Assessment/Plan:    1. S/P total knee arthroplasty, right Pain well controlled Ready for d/c to home Will f/u with ortho  and therapy  2. Paroxysmal atrial fibrillation NO further issues with palpitations Rate controlled On eliquis for CVA risk reduction   3. Essential hypertension Slight elevation Continue current meds, f/u with PCP  4. Stage 3b chronic kidney disease Continue to periodically monitor BMP and avoid nephrotoxic agents  5. Hyponatremia NA 128 in the ER No current symptoms Likely due to fluid shifts in surgery  6. Nonrheumatic mitral valve regurgitation Followed by cardiology on lasix.   Patient is being discharged with the following home health services:    Patient is being discharged with the following durable medical equipment:    Patient has been advised to f/u with their PCP in 1-2 weeks to for a transitions of care visit.  Social services at their facility was responsible for arranging this appointment.  Pt was provided with adequate prescriptions of noncontrolled medications to reach the scheduled appointment .  For controlled substances, a limited supply was provided as appropriate for the individual patient.  If the pt normally receives these medications from a pain clinic or has a contract with another physician, these medications should be received from that clinic or physician only).  Future labs/tests needed:  BMP

## 2022-07-26 ENCOUNTER — Encounter (HOSPITAL_BASED_OUTPATIENT_CLINIC_OR_DEPARTMENT_OTHER): Payer: Self-pay

## 2022-07-26 ENCOUNTER — Telehealth: Payer: Self-pay

## 2022-07-26 DIAGNOSIS — M25661 Stiffness of right knee, not elsewhere classified: Secondary | ICD-10-CM | POA: Diagnosis not present

## 2022-07-26 DIAGNOSIS — M25561 Pain in right knee: Secondary | ICD-10-CM | POA: Diagnosis not present

## 2022-07-26 NOTE — Telephone Encounter (Signed)
     Patient  visit on 4/11  at [facility]    Have you been able to follow up with your primary care physician? Yes   The patient was or was not able to obtain any needed medicine or equipment. Yes   Are there diet recommendations that you are having difficulty following? Na   Patient expresses understanding of discharge instructions and education provided has no other needs at this time.  Yes      Lenard Forth [facility] Guide, [facility] [phone] [address] [place], [place], [place] [address] Phone: [phone] Email: [email or web address]

## 2022-07-28 DIAGNOSIS — M25661 Stiffness of right knee, not elsewhere classified: Secondary | ICD-10-CM | POA: Diagnosis not present

## 2022-07-28 DIAGNOSIS — M25561 Pain in right knee: Secondary | ICD-10-CM | POA: Diagnosis not present

## 2022-07-30 DIAGNOSIS — M25661 Stiffness of right knee, not elsewhere classified: Secondary | ICD-10-CM | POA: Diagnosis not present

## 2022-07-30 DIAGNOSIS — M25561 Pain in right knee: Secondary | ICD-10-CM | POA: Diagnosis not present

## 2022-08-02 DIAGNOSIS — M25661 Stiffness of right knee, not elsewhere classified: Secondary | ICD-10-CM | POA: Diagnosis not present

## 2022-08-02 DIAGNOSIS — M25561 Pain in right knee: Secondary | ICD-10-CM | POA: Diagnosis not present

## 2022-08-04 DIAGNOSIS — M25661 Stiffness of right knee, not elsewhere classified: Secondary | ICD-10-CM | POA: Diagnosis not present

## 2022-08-04 DIAGNOSIS — M25561 Pain in right knee: Secondary | ICD-10-CM | POA: Diagnosis not present

## 2022-08-06 DIAGNOSIS — M25661 Stiffness of right knee, not elsewhere classified: Secondary | ICD-10-CM | POA: Diagnosis not present

## 2022-08-06 DIAGNOSIS — M25561 Pain in right knee: Secondary | ICD-10-CM | POA: Diagnosis not present

## 2022-08-09 DIAGNOSIS — M25561 Pain in right knee: Secondary | ICD-10-CM | POA: Diagnosis not present

## 2022-08-09 DIAGNOSIS — M25661 Stiffness of right knee, not elsewhere classified: Secondary | ICD-10-CM | POA: Diagnosis not present

## 2022-08-11 DIAGNOSIS — M25661 Stiffness of right knee, not elsewhere classified: Secondary | ICD-10-CM | POA: Diagnosis not present

## 2022-08-11 DIAGNOSIS — M25561 Pain in right knee: Secondary | ICD-10-CM | POA: Diagnosis not present

## 2022-08-16 ENCOUNTER — Other Ambulatory Visit (HOSPITAL_COMMUNITY): Payer: Self-pay | Admitting: Orthopedic Surgery

## 2022-08-16 DIAGNOSIS — M79604 Pain in right leg: Secondary | ICD-10-CM

## 2022-08-17 DIAGNOSIS — Z5189 Encounter for other specified aftercare: Secondary | ICD-10-CM | POA: Diagnosis not present

## 2022-08-19 ENCOUNTER — Ambulatory Visit (HOSPITAL_COMMUNITY)
Admission: RE | Admit: 2022-08-19 | Discharge: 2022-08-19 | Disposition: A | Discharge status: Home / Self Care | Payer: PPO | Source: Ambulatory Visit | Attending: Orthopedic Surgery | Admitting: Orthopedic Surgery

## 2022-08-19 DIAGNOSIS — M79604 Pain in right leg: Secondary | ICD-10-CM | POA: Diagnosis not present

## 2022-08-20 DIAGNOSIS — M25661 Stiffness of right knee, not elsewhere classified: Secondary | ICD-10-CM | POA: Diagnosis not present

## 2022-08-20 DIAGNOSIS — M25561 Pain in right knee: Secondary | ICD-10-CM | POA: Diagnosis not present

## 2022-08-23 DIAGNOSIS — M25561 Pain in right knee: Secondary | ICD-10-CM | POA: Diagnosis not present

## 2022-08-23 DIAGNOSIS — M25661 Stiffness of right knee, not elsewhere classified: Secondary | ICD-10-CM | POA: Diagnosis not present

## 2022-08-25 DIAGNOSIS — M25661 Stiffness of right knee, not elsewhere classified: Secondary | ICD-10-CM | POA: Diagnosis not present

## 2022-08-25 DIAGNOSIS — M25561 Pain in right knee: Secondary | ICD-10-CM | POA: Diagnosis not present

## 2022-08-30 DIAGNOSIS — M25561 Pain in right knee: Secondary | ICD-10-CM | POA: Diagnosis not present

## 2022-08-30 DIAGNOSIS — M25661 Stiffness of right knee, not elsewhere classified: Secondary | ICD-10-CM | POA: Diagnosis not present

## 2022-09-01 DIAGNOSIS — M25561 Pain in right knee: Secondary | ICD-10-CM | POA: Diagnosis not present

## 2022-09-01 DIAGNOSIS — M25661 Stiffness of right knee, not elsewhere classified: Secondary | ICD-10-CM | POA: Diagnosis not present

## 2022-09-13 ENCOUNTER — Ambulatory Visit (HOSPITAL_BASED_OUTPATIENT_CLINIC_OR_DEPARTMENT_OTHER): Payer: PPO | Admitting: Cardiology

## 2022-09-13 ENCOUNTER — Encounter (HOSPITAL_BASED_OUTPATIENT_CLINIC_OR_DEPARTMENT_OTHER): Payer: Self-pay | Admitting: Cardiology

## 2022-09-13 ENCOUNTER — Other Ambulatory Visit (HOSPITAL_BASED_OUTPATIENT_CLINIC_OR_DEPARTMENT_OTHER): Payer: Self-pay

## 2022-09-13 VITALS — BP 152/74 | HR 65 | Ht 58.5 in | Wt 122.2 lb

## 2022-09-13 DIAGNOSIS — I340 Nonrheumatic mitral (valve) insufficiency: Secondary | ICD-10-CM | POA: Diagnosis not present

## 2022-09-13 DIAGNOSIS — Z7901 Long term (current) use of anticoagulants: Secondary | ICD-10-CM | POA: Diagnosis not present

## 2022-09-13 DIAGNOSIS — I351 Nonrheumatic aortic (valve) insufficiency: Secondary | ICD-10-CM

## 2022-09-13 DIAGNOSIS — N1832 Chronic kidney disease, stage 3b: Secondary | ICD-10-CM

## 2022-09-13 DIAGNOSIS — D6869 Other thrombophilia: Secondary | ICD-10-CM | POA: Diagnosis not present

## 2022-09-13 DIAGNOSIS — I480 Paroxysmal atrial fibrillation: Secondary | ICD-10-CM | POA: Diagnosis not present

## 2022-09-13 DIAGNOSIS — I10 Essential (primary) hypertension: Secondary | ICD-10-CM | POA: Diagnosis not present

## 2022-09-13 MED ORDER — AMLODIPINE BESYLATE 5 MG PO TABS
5.0000 mg | ORAL_TABLET | Freq: Every day | ORAL | 11 refills | Status: DC
Start: 2022-09-13 — End: 2023-01-19
  Filled 2022-09-13: qty 30, 30d supply, fill #0
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
## 2022-09-13 NOTE — Patient Instructions (Addendum)
Medication Instructions:  Your physician has recommended you make the following change in your medication:   Start: Amlodipine 5mg daily  *If you need a refill on your cardiac medications before your next appointment, please call your pharmacy*  Follow-Up: At [facility], you and your health needs are our priority.  As part of our continuing mission to provide you with exceptional heart care, we have created designated Provider Care Teams.  These Care Teams include your primary Cardiologist (physician) and Advanced Practice Providers (APPs -  Physician Assistants and Nurse Practitioners) who all work together to provide you with the care you need, when you need it.  We recommend signing up for the patient portal called "MyChart".  Sign up information is provided on this After Visit Summary.  MyChart is used to connect with patients for Virtual Visits (Telemedicine).  Patients are able to view lab/test results, encounter notes, upcoming appointments, etc.  Non-urgent messages can be sent to your provider as well.   To learn more about what you can do with MyChart, go to [email or web address].    Your next appointment:   3 month(s)  Provider:   Bridgette Christopher, MD    

## 2022-11-23 DIAGNOSIS — H00024 Hordeolum internum left upper eyelid: Secondary | ICD-10-CM | POA: Diagnosis not present

## 2022-12-01 DIAGNOSIS — Z13820 Encounter for screening for osteoporosis: Secondary | ICD-10-CM | POA: Diagnosis not present

## 2022-12-01 DIAGNOSIS — N958 Other specified menopausal and perimenopausal disorders: Secondary | ICD-10-CM | POA: Diagnosis not present

## 2022-12-01 DIAGNOSIS — Z1231 Encounter for screening mammogram for malignant neoplasm of breast: Secondary | ICD-10-CM | POA: Diagnosis not present

## 2022-12-01 DIAGNOSIS — Z01419 Encounter for gynecological examination (general) (routine) without abnormal findings: Secondary | ICD-10-CM | POA: Diagnosis not present

## 2022-12-01 DIAGNOSIS — R29890 Loss of height: Secondary | ICD-10-CM | POA: Diagnosis not present

## 2022-12-01 DIAGNOSIS — Z6824 Body mass index (BMI) 24.0-24.9, adult: Secondary | ICD-10-CM | POA: Diagnosis not present

## 2022-12-08 DIAGNOSIS — D485 Neoplasm of uncertain behavior of skin: Secondary | ICD-10-CM | POA: Diagnosis not present

## 2022-12-08 DIAGNOSIS — I872 Venous insufficiency (chronic) (peripheral): Secondary | ICD-10-CM | POA: Diagnosis not present

## 2022-12-08 DIAGNOSIS — C44712 Basal cell carcinoma of skin of right lower limb, including hip: Secondary | ICD-10-CM | POA: Diagnosis not present

## 2022-12-08 DIAGNOSIS — C44729 Squamous cell carcinoma of skin of left lower limb, including hip: Secondary | ICD-10-CM | POA: Diagnosis not present

## 2022-12-08 DIAGNOSIS — C44619 Basal cell carcinoma of skin of left upper limb, including shoulder: Secondary | ICD-10-CM | POA: Diagnosis not present

## 2022-12-24 ENCOUNTER — Ambulatory Visit (HOSPITAL_BASED_OUTPATIENT_CLINIC_OR_DEPARTMENT_OTHER): Payer: PPO | Admitting: Cardiology

## 2022-12-24 ENCOUNTER — Encounter (HOSPITAL_BASED_OUTPATIENT_CLINIC_OR_DEPARTMENT_OTHER): Payer: Self-pay

## 2022-12-28 ENCOUNTER — Other Ambulatory Visit (HOSPITAL_BASED_OUTPATIENT_CLINIC_OR_DEPARTMENT_OTHER): Payer: Self-pay | Admitting: Cardiology

## 2022-12-28 DIAGNOSIS — I480 Paroxysmal atrial fibrillation: Secondary | ICD-10-CM

## 2022-12-28 IMAGING — DX DG HIP (WITH OR WITHOUT PELVIS) 2-3V*L*
1 series · 1 of 1 positions shown · non-contrast
Comparison: None.

CLINICAL DATA: Post hip reduction

EXAM:
DG HIP (WITH OR WITHOUT PELVIS) 2-3V LEFT

[hip ap]
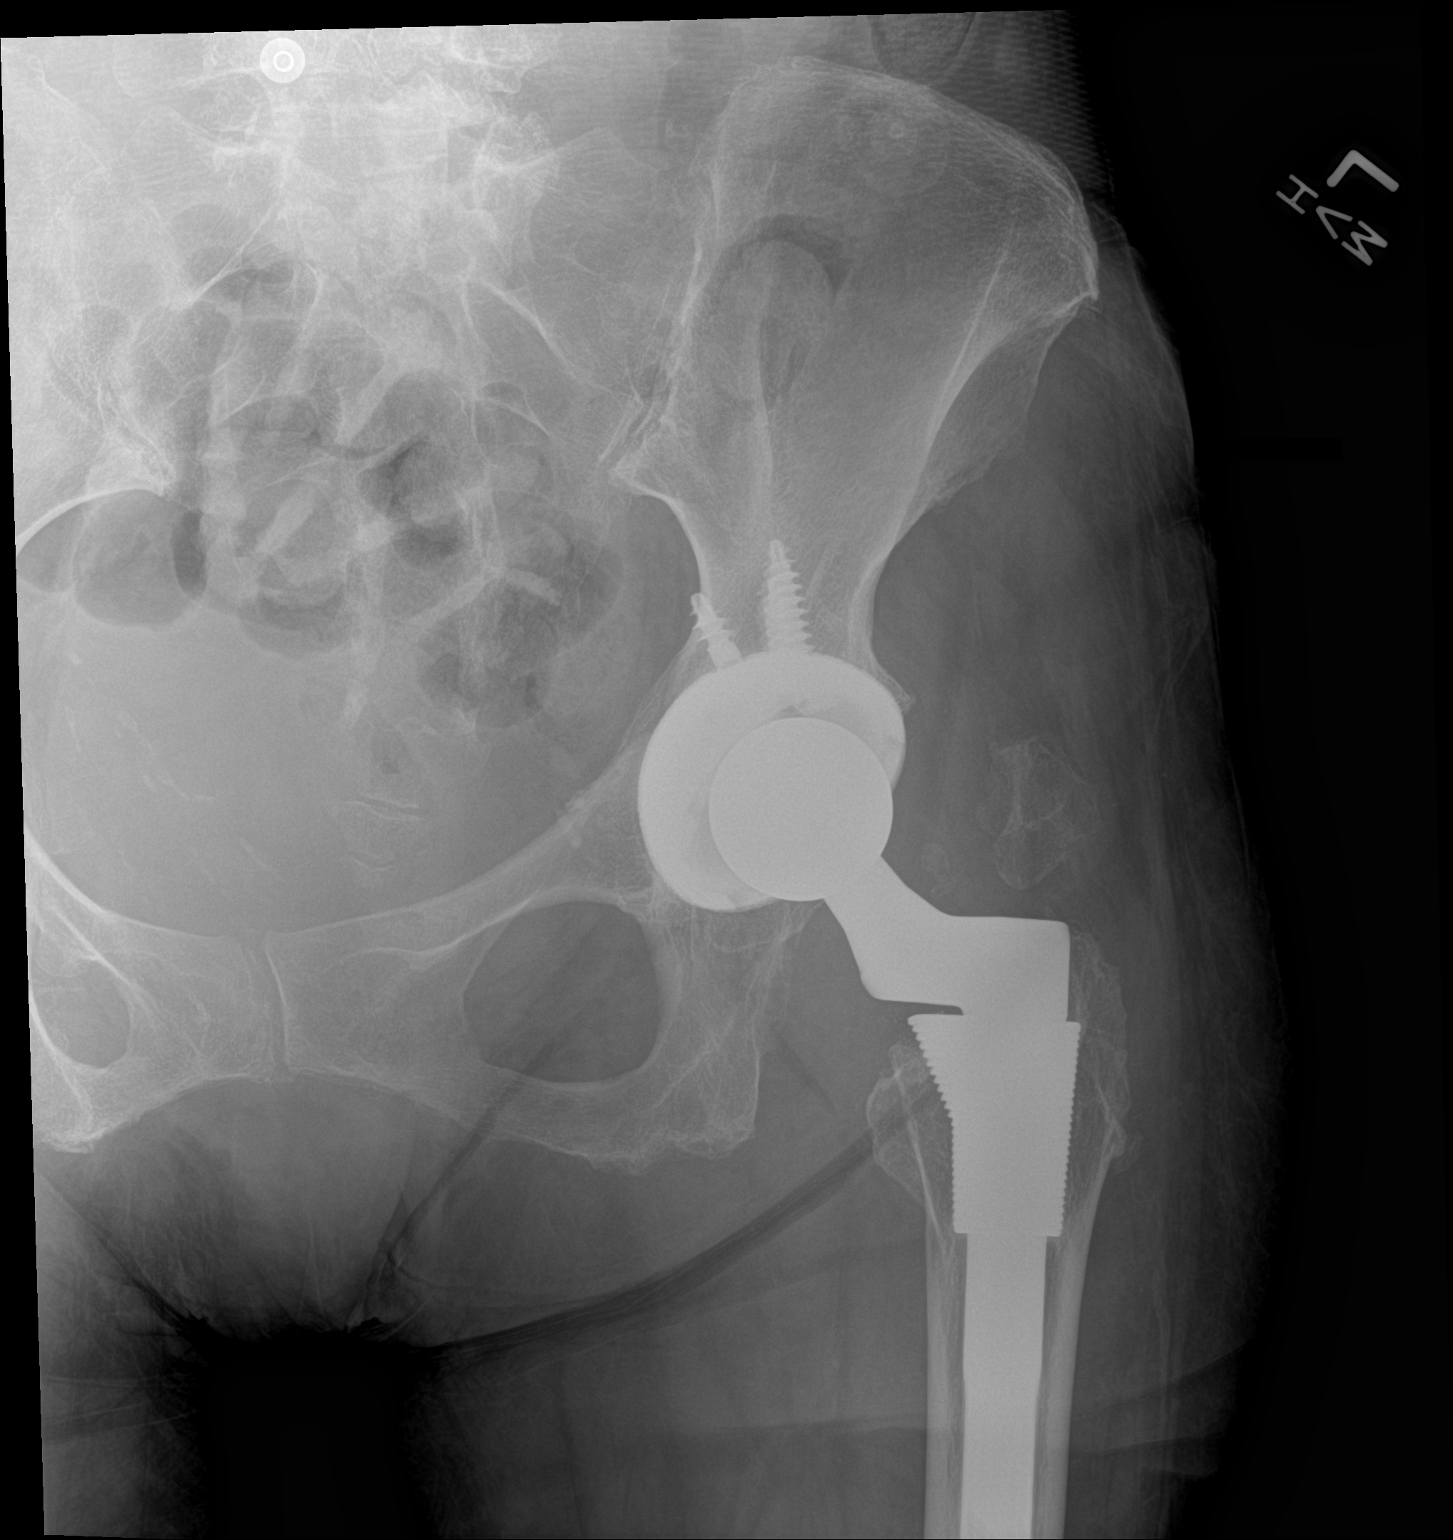

[1 of 1 positions shown; findings below may reference images not displayed]

FINDINGS: Interval reduction of the previously seen dislocated left hip
replacement. Normal AP alignment. Soft tissue calcification superior
to the femoral neck likely reflects heterotopic bone formation. No
fracture.
IMPRESSION: Interval reduction.  Normal AP alignment.

## 2022-12-28 IMAGING — DX DG HIP (WITH OR WITHOUT PELVIS) 1V PORT*L*
1 series · 1 of 1 positions shown · non-contrast
Comparison: Single view of the left hip 01/05/2012.

CLINICAL DATA: The patient felt a pop in her left hip when she bent
over today.

EXAM:
DG HIP (WITH OR WITHOUT PELVIS) 1V PORT LEFT

[hip ap]
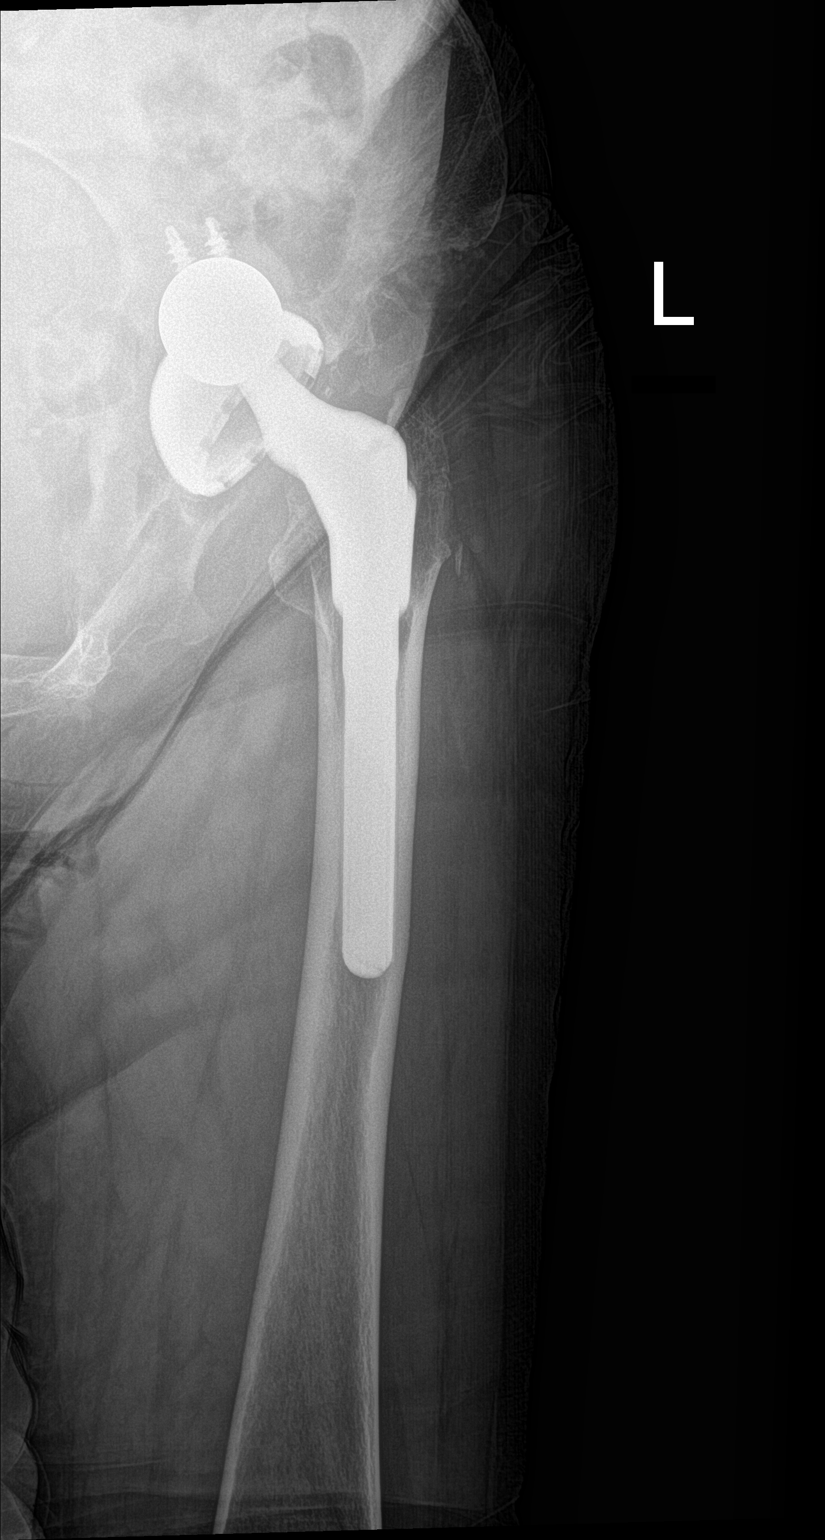

[1 of 1 positions shown; findings below may reference images not displayed]

FINDINGS: The patient has a left hip arthroplasty. The femoral head is
dislocated. No acute bony abnormality is seen.
IMPRESSION: Dislocated left hip replacement.

## 2022-12-28 NOTE — Telephone Encounter (Signed)
Prescription refill request for Eliquis received. Indication:afib Last office visit:6/24 Scr:1.52  4/24 Age: 82 Weight:55.4  kg  Prescription refilled

## 2023-01-17 ENCOUNTER — Ambulatory Visit: Payer: Self-pay | Admitting: Cardiology

## 2023-01-19 ENCOUNTER — Ambulatory Visit: Payer: PPO | Attending: Cardiology | Admitting: Cardiology

## 2023-01-19 ENCOUNTER — Encounter: Payer: Self-pay | Admitting: Cardiology

## 2023-01-19 VITALS — BP 138/76 | HR 68 | Resp 16 | Ht <= 58 in | Wt 120.0 lb

## 2023-01-19 DIAGNOSIS — D6869 Other thrombophilia: Secondary | ICD-10-CM

## 2023-01-19 DIAGNOSIS — I10 Essential (primary) hypertension: Secondary | ICD-10-CM | POA: Diagnosis not present

## 2023-01-19 DIAGNOSIS — I480 Paroxysmal atrial fibrillation: Secondary | ICD-10-CM

## 2023-01-19 DIAGNOSIS — Z1389 Encounter for screening for other disorder: Secondary | ICD-10-CM | POA: Diagnosis not present

## 2023-01-19 DIAGNOSIS — N1832 Chronic kidney disease, stage 3b: Secondary | ICD-10-CM | POA: Diagnosis not present

## 2023-01-19 MED ORDER — APIXABAN 2.5 MG PO TABS
2.5000 mg | ORAL_TABLET | Freq: Two times a day (BID) | ORAL | 3 refills | Status: DC
Start: 2023-01-19 — End: 2024-01-16

## 2023-01-19 NOTE — Progress Notes (Signed)
Cardiology Office Note:  .   Date:  01/19/2023  ID:  KENIDEE Baxter, DOB 09-26-1940, MRN [id number] PCP: Creola Corn, MD  [facility] Providers Cardiologist:  Yates Decamp, MD    History of Present Illness: .   Nancy Baxter is a 82 y.o. female with a hx of atrial fibrillation, heart murmur, hypertension, CKD III, GERD, and arthritis. Patient presented via EMS on 12/07/2021 to the emergency room with rapid heartbeat, heart rate around 160 bpm.  She was evaluated by Cardy Tobb, DO in the emergency room and her Apple Watch was personally interrogated by her and found to have persistent atrial fibrillation on 11/11/2021 and 12/07/2021.  For about 1.5 hours.  She was started on Eliquis and discharged home in view of elevated CHA2DS2-VASc score of 4.  She now presents to establish care with me and has a second opinion.  She has worn a cardiac monitor which revealed atrial tachycardia episodes but no atrial fibrillation.  Discussed the use of AI scribe software for clinical note transcription with the patient, who gave verbal consent to proceed.  History of Present Illness   Nancy Baxter, a patient with a history of high blood pressure and atrial fibrillation (AFib), presents for a follow-up visit. She reports that her Apple Watch detected AFib on two occasions, on 8/2 and 8/28. Since then, she has not had any further episodes. She is currently on Eliquis and metoprolol succinate. She requests a new prescription for Eliquis if she needs to continue on it.  The patient has a history of adverse reactions to previous blood pressure medications. One medication caused her to feel dizzy, another caused her ears to turn bright red and her heart to race, and a third caused insomnia. These side effects have been concerning for her, especially since she lives alone and needs to be able to take care of herself and drive.  She also reports having a knee replacement, which has limited her physical activity. 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 Currently, her physical activity is limited to walking down her long driveway a couple of times a day.  The patient also expresses concern about a newspaper article she read suggesting that a glass of wine could cause AFib. She enjoys a glass of wine with her dinner and is willing to give it up if it could cause AFib.      Review of Systems  Cardiovascular:  Negative for chest pain, dyspnea on exertion and leg swelling.    Risk Assessment/Calculations:    CHA2DS2-VASc Score = 4   This indicates a 4.8% annual risk of stroke. The patient's score is based upon: CHF History: 0 HTN History: 1 Diabetes History: 0 Stroke History: 0 Vascular Disease History: 0 Age Score: 2 Gender Score: 1             No results found for: "CHOL", "HDL", "LDLCALC", "LDLDIRECT", "TRIG", "CHOLHDL" Lab Results  Component Value Date   NA 128 (L) 07/21/2022   K 3.6 07/21/2022   CO2 24 07/21/2022   GLUCOSE 113 (H) 07/21/2022   BUN 30 (H) 07/21/2022   CREATININE 1.52 (H) 07/21/2022   CALCIUM 8.4 (L) 07/21/2022   GFRNONAA 34 (L) 07/21/2022   Lab Results  Component Value Date   WBC 10.7 (H) 07/21/2022   HGB 11.0 (L) 07/21/2022   HCT 32.7 (L) 07/21/2022   MCV 93.2 07/21/2022   PLT  135 (L) 07/21/2022    Physical Exam:   VS:  BP 138/76 (BP Location: Right Arm, Cuff Size: Normal)   Pulse 68   Resp 16   Ht 4\' 10"  (1.473 m)   Wt 120 lb (54.4 kg)   SpO2 98%   BMI 25.08 kg/m    Wt Readings from Last 3 Encounters:  01/19/23 120 lb (54.4 kg)  09/13/22 122 lb 3.2 oz (55.4 kg)  07/21/22 121 lb 14.6 oz (55.3 kg)     Physical Exam Neck:     Vascular: No carotid bruit or JVD.  Cardiovascular:     Rate and Rhythm: Normal rate and regular rhythm.     Pulses: Intact distal pulses.     Heart sounds: Normal heart sounds. No murmur heard.    No gallop.  Pulmonary:     Effort: Pulmonary effort is  normal.     Breath sounds: Normal breath sounds.  Abdominal:     General: Bowel sounds are normal.     Palpations: Abdomen is soft.  Musculoskeletal:     Right lower leg: No edema.     Left lower leg: No edema.     Studies Reviewed: Marland Kitchen    EKG:    EKG Interpretation Date/Time:  Wednesday January 19 2023 10:41:33 EDT Ventricular Rate:  67 PR Interval:  180 QRS Duration:  76 QT Interval:  412 QTC Calculation: 435 R Axis:   -28  Text Interpretation: EKG 01/19/2023: Normal sinus rhythm at rate of 67 bpm, poor R progression, cannot exclude anteroseptal infarct old.  No evidence of ischemia.  Compared to 07/22/2022, no significant change. Confirmed by Delrae Rend [phone]) on 01/19/2023 11:07:08 AM    Monitor  12/2021: Extended EKG monitoring 12/07/2021: Reveals almost 2 hours of persistent atrial fibrillation with rapid ventricular response, images personally reviewed.   1% AF/AFL burden (average ventricular rate 127 bpm), longest episode 1hr86min Occasional supraventricular ectopy, 3.2%. Rare ventricular ectopy.   Echo  12/09/2021:  1. Left ventricular ejection fraction, by estimation, is 60 to 65%. The left ventricle has normal function. The left ventricle has no regional wall motion abnormalities. Left ventricular diastolic parameters were normal.  2. Right ventricular systolic function is normal. The right ventricular size is normal. Tricuspid regurgitation signal is inadequate for assessing PA pressure.  3. Left atrial size was moderately dilated.  4. The mitral valve is normal in structure. Mild to moderate mitral valve regurgitation.  5. The aortic valve is tricuspid. There is mild calcification of the aortic valve. There is mild thickening of the aortic valve. Aortic valve regurgitation is moderate. Aortic valve sclerosis is present, with no evidence of aortic valve stenosis.  6. There is borderline dilatation of the ascending aorta, measuring 37 mm.  7. The inferior vena cava  is dilated in size with >50% respiratory variability, suggesting right atrial pressure of 8 mmHg.    ASSESSMENT AND PLAN: .      ICD-10-CM   1. Essential hypertension  I10 EKG 12-Lead    2. Paroxysmal atrial fibrillation (HCC)  I48.0 apixaban (ELIQUIS) 2.5 MG TABS tablet    3. Secondary hypercoagulable state (HCC)  D68.69     4. Stage 3b chronic kidney disease (HCC)  N18.32      CHA2DS2-VASc Score = 4 [CHF History: 0, HTN History: 1, Diabetes History: 0, Stroke History: 0, Vascular Disease History: 0, Age Score: 2, Gender Score: 1].  Therefore, the patient's annual risk of stroke is 4.8 %.  Assessment and Plan    Atrial Fibrillation Documented episodes on 11/12/2022 and 12/08/2022. Currently asymptomatic. Discussed the risk of stroke and the need for anticoagulation. -Continue Eliquis, provide prescription for 90 days with refills for a year. -If rapid heartbeat occurs, take 1-2 extra tablets of Metoprolol Succinate 25mg  and contact the office. Patient is currently on appropriate dose of Eliquis in view of her age and her weight 2.5 mg twice daily, secondary hypercoagulable state.  Hypertension History of high blood pressure since age 9. Recent medication changes due to side effects including dizziness, insomnia, and tachycardia, patient states that she is doing well on present medical regimen and would like to continue the same. Patient has stage IIIa chronic kidney disease but has remained stable and is being closely monitored by her PCP, present on losartan 100 mg daily for hypertension and furosemide 40 mg daily, continue the same for now.  Repeat blood pressure monitoring reveals good control with blood pressure <140/80 mmHg I will see her back in a year or sooner if problems.  -Continue current regimen. -If symptoms of hypertension occur, contact the office.  General Health Maintenance / Followup Plans -Encouraged to increase physical activity and return to previous level of  activity. -Follow-up in 1 year or sooner if symptoms of AFib occur.       Signed,  Yates Decamp, MD, [facility] 01/19/2023, 6:04 PM

## 2023-01-19 NOTE — Patient Instructions (Addendum)
You are on a medication called metoprolol succinate 25 mg daily for your atrial fibrillation.  If you ever develop rapid heartbeat, you can take 1-2 extra tablet and call our office to be seen.  Medication Instructions:  Your physician recommends that you continue on your current medications as directed. Please refer to the Current Medication list given to you today.  *If you need a refill on your cardiac medications before your next appointment, please call your pharmacy*   Lab Work: none If you have labs (blood work) drawn today and your tests are completely normal, you will receive your results only by: MyChart Message (if you have MyChart) OR A paper copy in the mail If you have any lab test that is abnormal or we need to change your treatment, we will call you to review the results.   Testing/Procedures: none   Follow-Up: At [facility], you and your health needs are our priority.  As part of our continuing mission to provide you with exceptional heart care, we have created designated Provider Care Teams.  These Care Teams include your primary Cardiologist (physician) and Advanced Practice Providers (APPs -  Physician Assistants and Nurse Practitioners) who all work together to provide you with the care you need, when you need it.  We recommend signing up for the patient portal called "MyChart".  Sign up information is provided on this After Visit Summary.  MyChart is used to connect with patients for Virtual Visits (Telemedicine).  Patients are able to view lab/test results, encounter notes, upcoming appointments, etc.  Non-urgent messages can be sent to your provider as well.   To learn more about what you can do with MyChart, go to [email or web address].    Your next appointment:   12 month(s)  Provider:   Yates Decamp, MD     Other Instructions

## 2023-01-27 DIAGNOSIS — I480 Paroxysmal atrial fibrillation: Secondary | ICD-10-CM | POA: Diagnosis not present

## 2023-01-27 DIAGNOSIS — Z1331 Encounter for screening for depression: Secondary | ICD-10-CM | POA: Diagnosis not present

## 2023-01-27 DIAGNOSIS — D6869 Other thrombophilia: Secondary | ICD-10-CM | POA: Diagnosis not present

## 2023-01-27 DIAGNOSIS — Z1389 Encounter for screening for other disorder: Secondary | ICD-10-CM | POA: Diagnosis not present

## 2023-01-27 DIAGNOSIS — M1990 Unspecified osteoarthritis, unspecified site: Secondary | ICD-10-CM | POA: Diagnosis not present

## 2023-01-27 DIAGNOSIS — D696 Thrombocytopenia, unspecified: Secondary | ICD-10-CM | POA: Diagnosis not present

## 2023-01-27 DIAGNOSIS — Z0000 Encounter for general adult medical examination without abnormal findings: Secondary | ICD-10-CM | POA: Diagnosis not present

## 2023-01-27 DIAGNOSIS — C44712 Basal cell carcinoma of skin of right lower limb, including hip: Secondary | ICD-10-CM | POA: Diagnosis not present

## 2023-01-27 DIAGNOSIS — I872 Venous insufficiency (chronic) (peripheral): Secondary | ICD-10-CM | POA: Diagnosis not present

## 2023-01-27 DIAGNOSIS — I129 Hypertensive chronic kidney disease with stage 1 through stage 4 chronic kidney disease, or unspecified chronic kidney disease: Secondary | ICD-10-CM | POA: Diagnosis not present

## 2023-01-27 DIAGNOSIS — Z7901 Long term (current) use of anticoagulants: Secondary | ICD-10-CM | POA: Diagnosis not present

## 2023-01-27 DIAGNOSIS — E871 Hypo-osmolality and hyponatremia: Secondary | ICD-10-CM | POA: Diagnosis not present

## 2023-01-27 DIAGNOSIS — I340 Nonrheumatic mitral (valve) insufficiency: Secondary | ICD-10-CM | POA: Diagnosis not present

## 2023-01-27 DIAGNOSIS — D692 Other nonthrombocytopenic purpura: Secondary | ICD-10-CM | POA: Diagnosis not present

## 2023-01-27 DIAGNOSIS — N1831 Chronic kidney disease, stage 3a: Secondary | ICD-10-CM | POA: Diagnosis not present

## 2023-01-28 DIAGNOSIS — M25551 Pain in right hip: Secondary | ICD-10-CM | POA: Diagnosis not present

## 2023-01-28 DIAGNOSIS — G5601 Carpal tunnel syndrome, right upper limb: Secondary | ICD-10-CM | POA: Diagnosis not present

## 2023-02-01 DIAGNOSIS — M25551 Pain in right hip: Secondary | ICD-10-CM | POA: Diagnosis not present

## 2023-02-03 DIAGNOSIS — C44629 Squamous cell carcinoma of skin of left upper limb, including shoulder: Secondary | ICD-10-CM | POA: Diagnosis not present

## 2023-02-06 IMAGING — DX DG PORTABLE PELVIS
2 series · 2 of 2 positions shown · non-contrast
Comparison: 06/27/2020

CLINICAL DATA: Left hip arthroplasty revision

EXAM:
PORTABLE PELVIS 1-2 VIEWS

[pelvis ap]
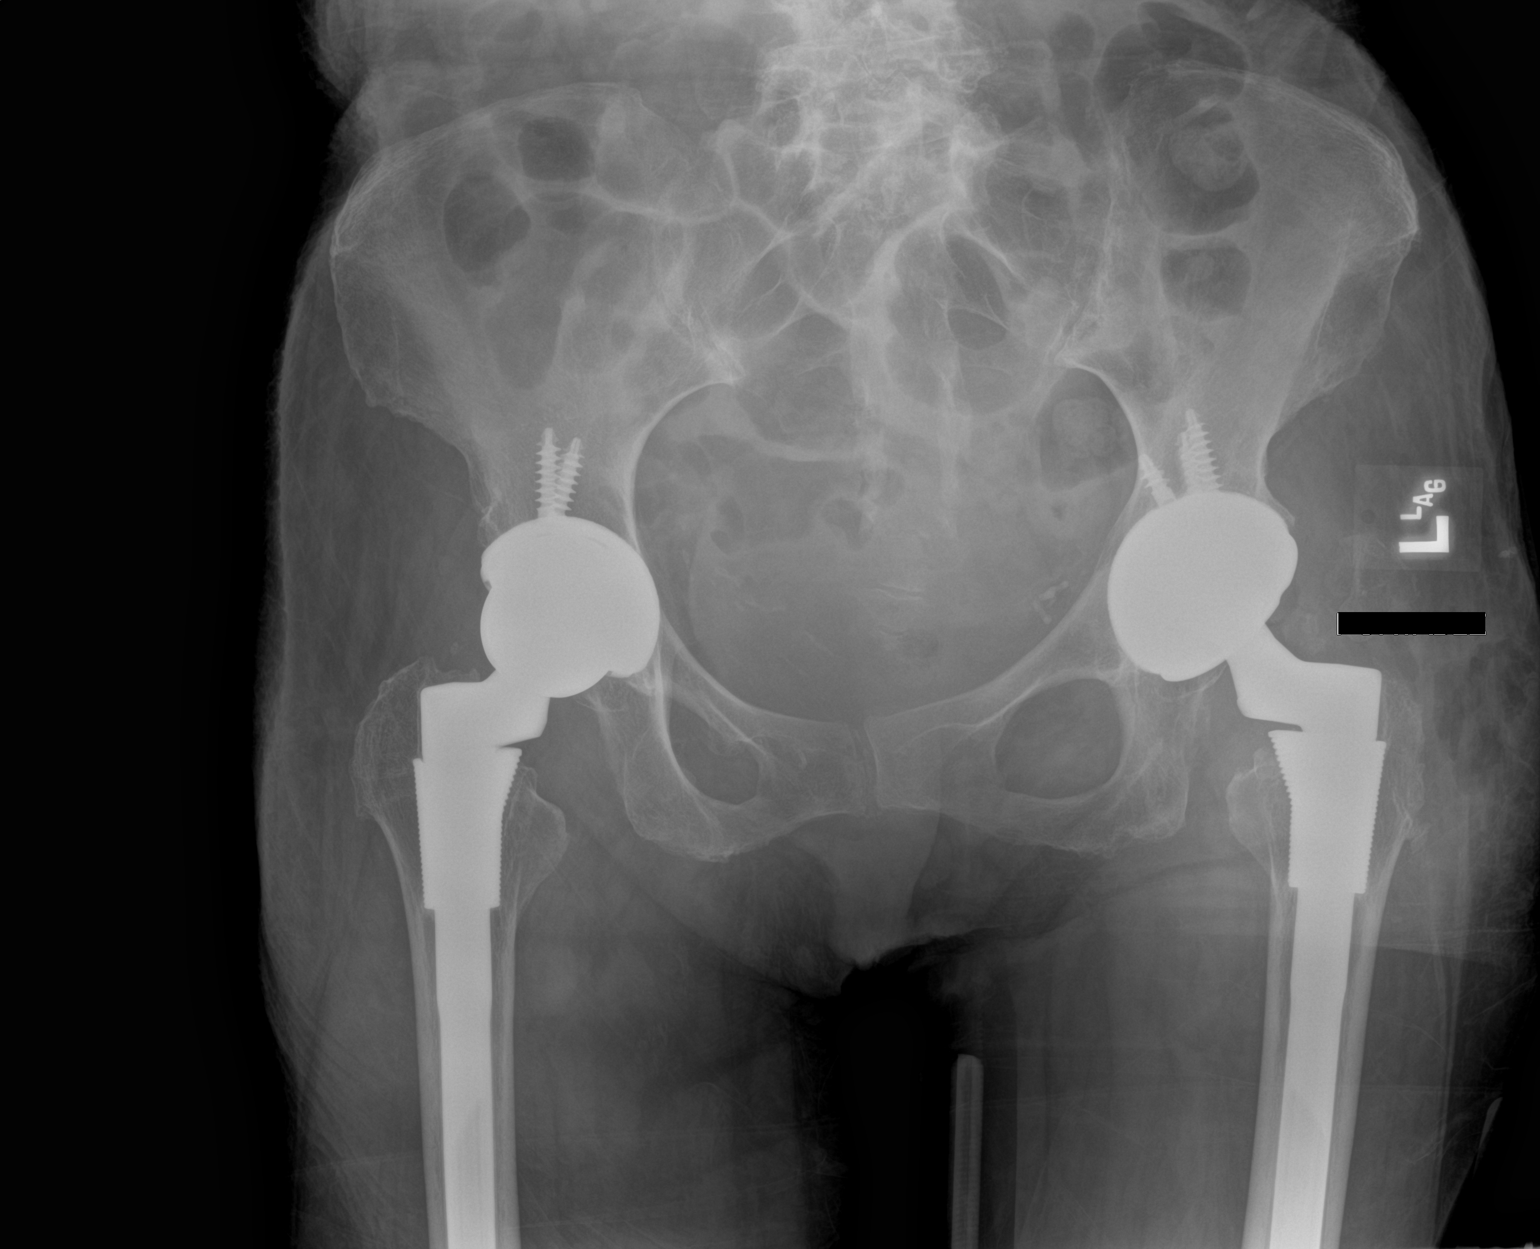

[pelvis lat]
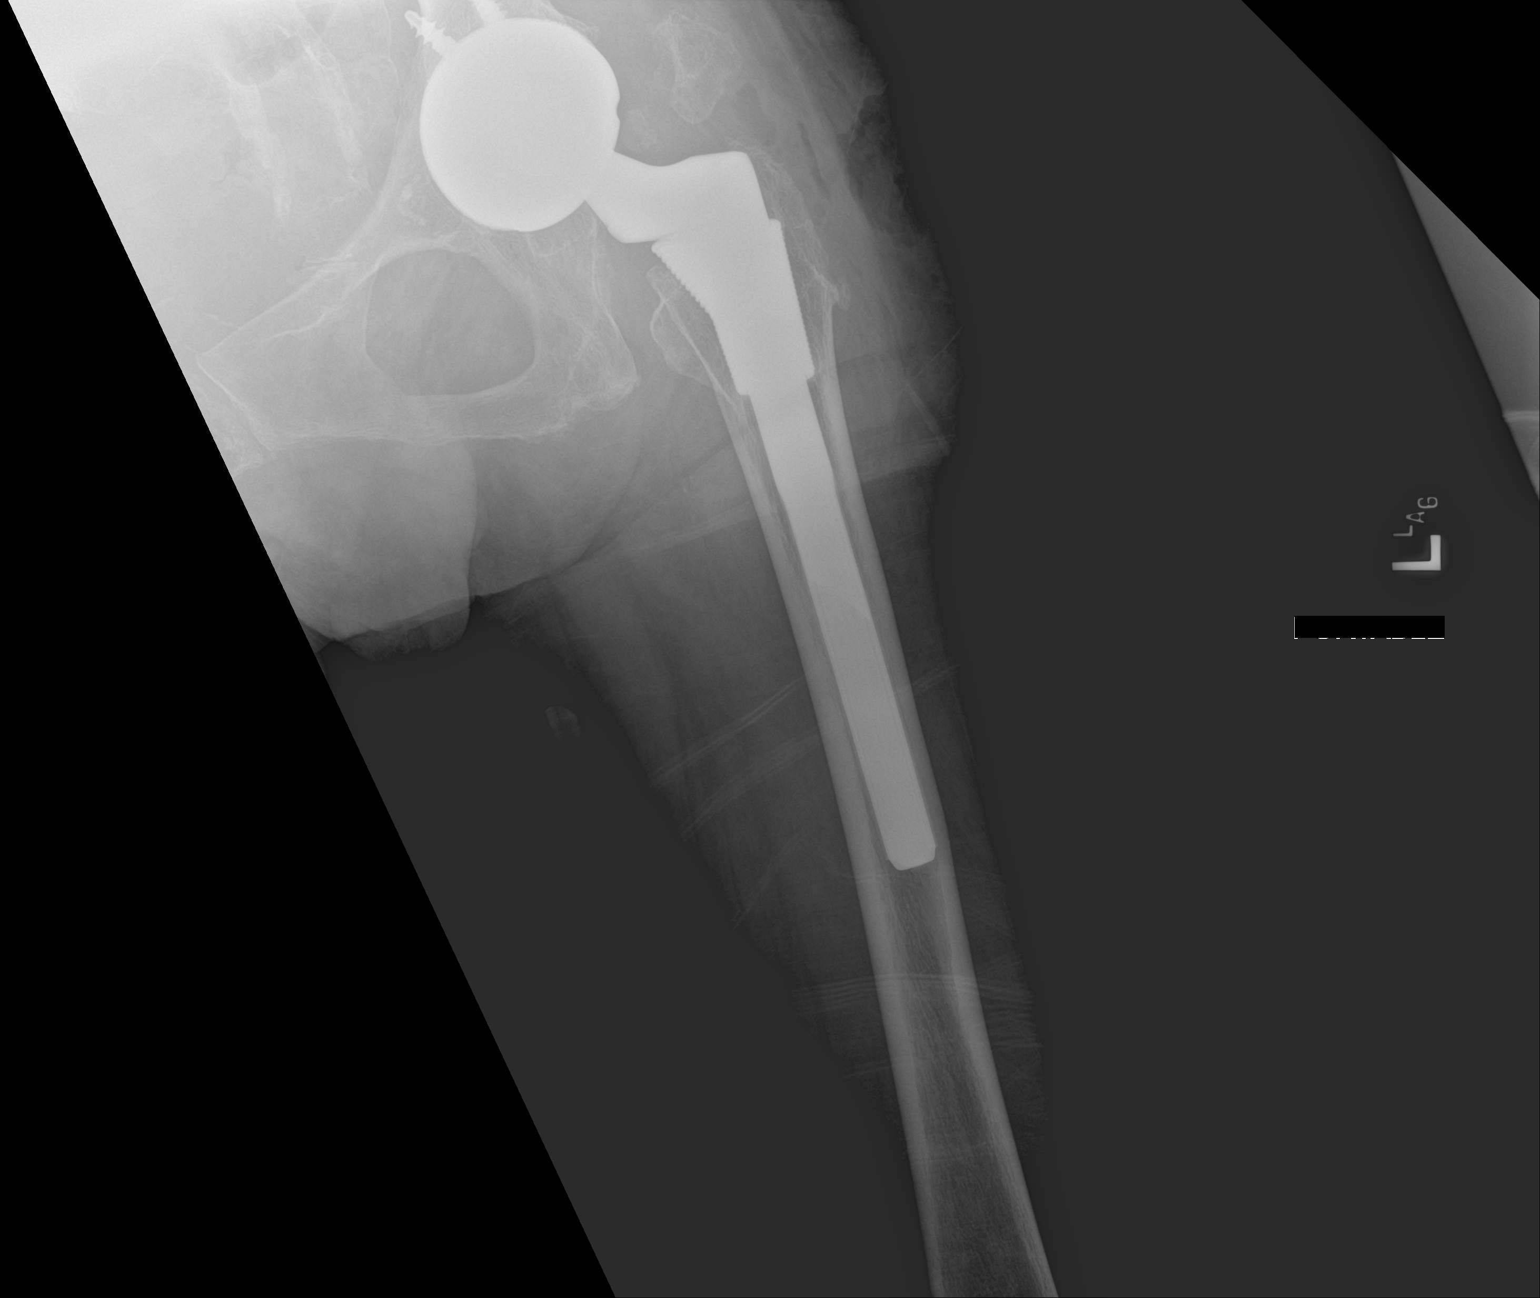

[2 of 2 positions shown; findings below may reference images not displayed]

FINDINGS: Single view radiograph of the pelvis and two view radiograph of the
left hip demonstrates surgical changes of bilateral total hip
arthroplasty. Arthroplasty components overlie the expected position.
No unexpected fracture or dislocation. Heterotopic ossification is
seen within the soft tissues superolateral to the left hip.
Subcutaneous gas is seen with the soft tissues lateral to the left
hip.
IMPRESSION: Normal alignment.  No unexpected fracture or dislocation.

## 2023-02-11 DIAGNOSIS — M7061 Trochanteric bursitis, right hip: Secondary | ICD-10-CM | POA: Diagnosis not present

## 2023-02-21 DIAGNOSIS — D492 Neoplasm of unspecified behavior of bone, soft tissue, and skin: Secondary | ICD-10-CM | POA: Diagnosis not present

## 2023-03-08 DIAGNOSIS — M25551 Pain in right hip: Secondary | ICD-10-CM | POA: Diagnosis not present

## 2023-03-14 DIAGNOSIS — G5601 Carpal tunnel syndrome, right upper limb: Secondary | ICD-10-CM | POA: Diagnosis not present

## 2023-03-14 DIAGNOSIS — M13841 Other specified arthritis, right hand: Secondary | ICD-10-CM | POA: Diagnosis not present

## 2023-03-15 ENCOUNTER — Telehealth: Payer: Self-pay | Admitting: *Deleted

## 2023-03-15 NOTE — Telephone Encounter (Signed)
We have chart reviewed as part of pre-op protocol. You recently saw this pt on 01/19/2023. Do you mind commenting on whether she will need further cardiovascular testing prior to carpel tunnel surgery. Please route your response to P CV DIV PREOP. Thank you!

## 2023-03-15 NOTE — Telephone Encounter (Signed)
Low risk for surgery. Hold Eliquis for 3 days and restart at the earliest per surgery

## 2023-03-15 NOTE — Telephone Encounter (Signed)
   Pre-operative Risk Assessment    Patient Name: Nancy Baxter  DOB: Apr 25, 1940 MRN: [id number]  DATE OF LAST VISIT: 01/19/23 DR. Jacinto Halim DATE OF NEXT VISIT: NONE     Request for Surgical Clearance    Procedure:   RIGHT CARPAL TUNNEL RELEASE  Date of Surgery:  Clearance TBD                                 Surgeon:  DR. Dominica Severin Surgeon's Group or Practice Name:  [facility] Phone number:  [phone] Fax number:  [phone] [facility]   Type of Clearance Requested:   - Medical  - Pharmacy:  Hold Apixaban (Eliquis) x 2 DAYS PRIOR TO SURGERY   Type of Anesthesia:  Local    Additional requests/questions:    Elpidio Anis   03/15/2023, 9:39 AM

## 2023-03-16 ENCOUNTER — Encounter: Payer: Self-pay | Admitting: Cardiology

## 2023-03-16 NOTE — Telephone Encounter (Signed)
Patient with diagnosis of afib on Eliquis for anticoagulation.    Procedure:  RIGHT CARPAL TUNNEL RELEASE  Date of procedure: TBD   CHA2DS2-VASc Score = 4   This indicates a 4.8% annual risk of stroke. The patient's score is based upon: CHF History: 0 HTN History: 1 Diabetes History: 0 Stroke History: 0 Vascular Disease History: 0 Age Score: 2 Gender Score: 1      CrCl 21 ml/min Platelet count 135  Per office protocol, patient can hold Eliquis for 3 days prior to procedure.    **This guidance is not considered finalized until pre-operative APP has relayed final recommendations.**

## 2023-03-16 NOTE — Telephone Encounter (Signed)
Nancy Baxter 82 year old female is requesting preoperative cardiac evaluation for right carpal tunnel release.  Her procedure has not yet been scheduled.  Pharmacy has provided recommendations for holding anticoagulation.  She was recently seen by you in clinic on 01/19/2023.  Could you please weigh in on cardiac risk for upcoming surgery?  Thank you for your help.  Please direct your response to CV DIV preop pool.    Nancy Ripple. [name] NP-C     03/16/2023, 9:15 AM [facility] [address] Office [phone] Fax [phone]

## 2023-03-22 NOTE — Telephone Encounter (Signed)
  Nancy Baxter is at low risk, from a cardiac standpoint, for her upcoming procedure: Carpel tunnel release.  It is ok to proceed without further cardiac testing.   If applicable can hold Eliquis for 3  day(s) prior to procedure and re-start ASAP post procedure  days post procedure.   Please call Dept: [phone] with any additional questions.   Per Dr. Jacinto Halim on 03/22/2023

## 2023-03-22 NOTE — Telephone Encounter (Signed)
Already done  Nancy Baxter is at low risk, from a cardiac standpoint, for her upcoming procedure: Carpel tunnel release.  It is ok to proceed without further cardiac testing.  If applicable can hold Eliquis for 3  day(s) prior to procedure and re-start ASAP post procedure  days post procedure.  Please call Dept: [phone] with any additional questions.

## 2023-04-19 DIAGNOSIS — D045 Carcinoma in situ of skin of trunk: Secondary | ICD-10-CM | POA: Diagnosis not present

## 2023-04-19 DIAGNOSIS — C44612 Basal cell carcinoma of skin of right upper limb, including shoulder: Secondary | ICD-10-CM | POA: Diagnosis not present

## 2023-04-19 DIAGNOSIS — C44519 Basal cell carcinoma of skin of other part of trunk: Secondary | ICD-10-CM | POA: Diagnosis not present

## 2023-04-19 DIAGNOSIS — Z08 Encounter for follow-up examination after completed treatment for malignant neoplasm: Secondary | ICD-10-CM | POA: Diagnosis not present

## 2023-04-19 DIAGNOSIS — C44712 Basal cell carcinoma of skin of right lower limb, including hip: Secondary | ICD-10-CM | POA: Diagnosis not present

## 2023-04-19 DIAGNOSIS — D492 Neoplasm of unspecified behavior of bone, soft tissue, and skin: Secondary | ICD-10-CM | POA: Diagnosis not present

## 2023-04-19 DIAGNOSIS — Z7189 Other specified counseling: Secondary | ICD-10-CM | POA: Diagnosis not present

## 2023-04-19 DIAGNOSIS — L814 Other melanin hyperpigmentation: Secondary | ICD-10-CM | POA: Diagnosis not present

## 2023-04-19 DIAGNOSIS — D225 Melanocytic nevi of trunk: Secondary | ICD-10-CM | POA: Diagnosis not present

## 2023-04-19 DIAGNOSIS — L821 Other seborrheic keratosis: Secondary | ICD-10-CM | POA: Diagnosis not present

## 2023-04-19 DIAGNOSIS — Z85828 Personal history of other malignant neoplasm of skin: Secondary | ICD-10-CM | POA: Diagnosis not present

## 2023-04-27 DIAGNOSIS — C44712 Basal cell carcinoma of skin of right lower limb, including hip: Secondary | ICD-10-CM | POA: Diagnosis not present

## 2023-04-27 DIAGNOSIS — C44612 Basal cell carcinoma of skin of right upper limb, including shoulder: Secondary | ICD-10-CM | POA: Diagnosis not present

## 2023-05-25 DIAGNOSIS — D045 Carcinoma in situ of skin of trunk: Secondary | ICD-10-CM | POA: Diagnosis not present

## 2023-07-01 DIAGNOSIS — C44519 Basal cell carcinoma of skin of other part of trunk: Secondary | ICD-10-CM | POA: Diagnosis not present

## 2023-08-12 DIAGNOSIS — D692 Other nonthrombocytopenic purpura: Secondary | ICD-10-CM | POA: Diagnosis not present

## 2023-08-12 DIAGNOSIS — I480 Paroxysmal atrial fibrillation: Secondary | ICD-10-CM | POA: Diagnosis not present

## 2023-08-12 DIAGNOSIS — I340 Nonrheumatic mitral (valve) insufficiency: Secondary | ICD-10-CM | POA: Diagnosis not present

## 2023-08-12 DIAGNOSIS — I129 Hypertensive chronic kidney disease with stage 1 through stage 4 chronic kidney disease, or unspecified chronic kidney disease: Secondary | ICD-10-CM | POA: Diagnosis not present

## 2023-08-12 DIAGNOSIS — G2581 Restless legs syndrome: Secondary | ICD-10-CM | POA: Diagnosis not present

## 2023-08-12 DIAGNOSIS — I872 Venous insufficiency (chronic) (peripheral): Secondary | ICD-10-CM | POA: Diagnosis not present

## 2023-08-12 DIAGNOSIS — E871 Hypo-osmolality and hyponatremia: Secondary | ICD-10-CM | POA: Diagnosis not present

## 2023-08-12 DIAGNOSIS — D6869 Other thrombophilia: Secondary | ICD-10-CM | POA: Diagnosis not present

## 2023-08-12 DIAGNOSIS — M1990 Unspecified osteoarthritis, unspecified site: Secondary | ICD-10-CM | POA: Diagnosis not present

## 2023-08-12 DIAGNOSIS — N1831 Chronic kidney disease, stage 3a: Secondary | ICD-10-CM | POA: Diagnosis not present

## 2023-08-12 DIAGNOSIS — Z7901 Long term (current) use of anticoagulants: Secondary | ICD-10-CM | POA: Diagnosis not present

## 2023-08-12 DIAGNOSIS — G4700 Insomnia, unspecified: Secondary | ICD-10-CM | POA: Diagnosis not present

## 2023-08-15 ENCOUNTER — Other Ambulatory Visit: Payer: Self-pay | Admitting: Internal Medicine

## 2023-08-15 DIAGNOSIS — R7989 Other specified abnormal findings of blood chemistry: Secondary | ICD-10-CM

## 2023-08-18 ENCOUNTER — Ambulatory Visit
Admission: RE | Admit: 2023-08-18 | Discharge: 2023-08-18 | Disposition: A | Discharge status: Home / Self Care | Source: Ambulatory Visit | Attending: Internal Medicine | Admitting: Internal Medicine

## 2023-08-18 DIAGNOSIS — N289 Disorder of kidney and ureter, unspecified: Secondary | ICD-10-CM | POA: Diagnosis not present

## 2023-08-18 DIAGNOSIS — R7989 Other specified abnormal findings of blood chemistry: Secondary | ICD-10-CM

## 2023-10-25 ENCOUNTER — Other Ambulatory Visit: Payer: Self-pay

## 2023-10-25 ENCOUNTER — Encounter (HOSPITAL_BASED_OUTPATIENT_CLINIC_OR_DEPARTMENT_OTHER): Payer: Self-pay | Admitting: Emergency Medicine

## 2023-10-25 ENCOUNTER — Emergency Department (HOSPITAL_BASED_OUTPATIENT_CLINIC_OR_DEPARTMENT_OTHER)
Admission: EM | Admit: 2023-10-25 | Discharge: 2023-10-25 | Disposition: A | Discharge status: Home / Self Care | Attending: Emergency Medicine | Admitting: Emergency Medicine

## 2023-10-25 ENCOUNTER — Other Ambulatory Visit (HOSPITAL_BASED_OUTPATIENT_CLINIC_OR_DEPARTMENT_OTHER): Payer: Self-pay

## 2023-10-25 DIAGNOSIS — S40861A Insect bite (nonvenomous) of right upper arm, initial encounter: Secondary | ICD-10-CM | POA: Insufficient documentation

## 2023-10-25 DIAGNOSIS — Z7901 Long term (current) use of anticoagulants: Secondary | ICD-10-CM | POA: Diagnosis not present

## 2023-10-25 DIAGNOSIS — W57XXXA Bitten or stung by nonvenomous insect and other nonvenomous arthropods, initial encounter: Secondary | ICD-10-CM | POA: Diagnosis not present

## 2023-10-25 MED ORDER — DOXYCYCLINE HYCLATE 100 MG PO CAPS
100.0000 mg | ORAL_CAPSULE | Freq: Two times a day (BID) | ORAL | 0 refills | Status: DC
  Filled 2023-10-25: qty 20, 10d supply, fill #0

## 2023-10-25 NOTE — ED Triage Notes (Signed)
 Pt arrived from home with c/o tick bite to R upper back. Removed last night. Reddened area at site and itching. Afebrile.

## 2023-10-25 NOTE — ED Provider Notes (Signed)
 [place] EMERGENCY DEPARTMENT AT [facility] Provider Note   CSN: [id number] Arrival date & time: 10/25/23  9198     Patient presents with: Tick Removal   Nancy Baxter is a 83 y.o. female.   83 year old female presents after a tick bite to her right upper back.  States he is unsure how long the tick was on her back for.  She removed it herself.  Denied any symptoms from this such as fever or chills.  Patient did show me a picture of the tape which was very much engorged       Prior to Admission medications   Medication Sig Start Date End Date Taking? Authorizing Provider  acetaminophen  (TYLENOL ) 650 MG CR tablet Take 1,300 mg by mouth 2 (two) times daily.    [provider]  apixaban  (ELIQUIS ) 2.5 MG TABS tablet Take 1 tablet (2.5 mg total) by mouth 2 (two) times daily. 01/19/23   Ladona Heinz, MD  cyanocobalamin (VITAMIN B12) 1000 MCG tablet Take 2,000 mcg by mouth daily.    [provider]  ferrous sulfate  325 (65 FE) MG tablet Take 325 mg by mouth once a week.    [provider]  furosemide  (LASIX ) 40 MG tablet Take 40 mg by mouth.    [provider]  gabapentin  (NEURONTIN ) 100 MG capsule Take 200 mg by mouth at bedtime. RLS    [provider]  losartan  (COZAAR ) 100 MG tablet Take 100 mg by mouth in the morning. 05/29/20   [provider]  Magnesium  200 MG TABS Take 1 tablet by mouth daily.    [provider]  metoprolol  succinate (TOPROL -XL) 25 MG 24 hr tablet Take 25 mg by mouth daily.    [provider]  NON FORMULARY Take 2 tablets by mouth daily. Calcium, Magnesium , Zinc, Vitamin D    [provider]  trolamine salicylate (ASPERCREME) 10 % cream Apply 1 Application topically as needed for muscle pain.    [provider]    Allergies: Sulfa antibiotics, Codeine, Prednisone, Zolpidem tartrate, Adhesive [tape], and Diltiazem     Review of Systems  All other systems reviewed  and are negative.   Updated Vital Signs Temp 97.7 F (36.5 C) (Oral)   Physical Exam Vitals and nursing note reviewed.  Constitutional:      General: She is not in acute distress.    Appearance: Normal appearance. She is well-developed. She is not toxic-appearing.  HENT:     Head: Normocephalic and atraumatic.  Eyes:     General: Lids are normal.     Conjunctiva/sclera: Conjunctivae normal.     Pupils: Pupils are equal, round, and reactive to light.  Neck:     Thyroid : No thyroid  mass.     Trachea: No tracheal deviation.  Cardiovascular:     Rate and Rhythm: Normal rate and regular rhythm.     Heart sounds: Normal heart sounds. No murmur heard.    No gallop.  Pulmonary:     Effort: Pulmonary effort is normal. No respiratory distress.     Breath sounds: Normal breath sounds. No stridor. No decreased breath sounds, wheezing, rhonchi or rales.  Abdominal:     General: There is no distension.     Palpations: Abdomen is soft.     Tenderness: There is no abdominal tenderness. There is no rebound.  Musculoskeletal:        General: No tenderness. Normal range of motion.     Cervical back: Normal range of  motion and neck supple.       Back:  Skin:    General: Skin is warm and dry.     Findings: No abrasion or rash.  Neurological:     Mental Status: She is alert and oriented to person, place, and time. Mental status is at baseline.     GCS: GCS eye subscore is 4. GCS verbal subscore is 5. GCS motor subscore is 6.     Cranial Nerves: No cranial nerve deficit.     Sensory: No sensory deficit.     Motor: Motor function is intact.  Psychiatric:        Attention and Perception: Attention normal.        Speech: Speech normal.        Behavior: Behavior normal.     (all labs ordered are listed, but only abnormal results are displayed) Labs Reviewed - No data to display  EKG: None  Radiology: No results found.   Procedures   Medications Ordered in the ED - No data to  display                                  Medical Decision Making  Patient to be placed on doxycycline  and will follow with her doctor as needed     Final diagnoses:  None    ED Discharge Orders     None          Dasie Faden, MD 10/25/23 0825

## 2023-10-25 NOTE — ED Notes (Signed)
 Pt given discharge instructions and reviewed prescriptions. Opportunities given for questions. Pt verbalizes understanding. Jillyn Hidden, RN

## 2024-01-04 DIAGNOSIS — D485 Neoplasm of uncertain behavior of skin: Secondary | ICD-10-CM | POA: Diagnosis not present

## 2024-01-04 DIAGNOSIS — L814 Other melanin hyperpigmentation: Secondary | ICD-10-CM | POA: Diagnosis not present

## 2024-01-04 DIAGNOSIS — C44712 Basal cell carcinoma of skin of right lower limb, including hip: Secondary | ICD-10-CM | POA: Diagnosis not present

## 2024-01-04 DIAGNOSIS — Z08 Encounter for follow-up examination after completed treatment for malignant neoplasm: Secondary | ICD-10-CM | POA: Diagnosis not present

## 2024-01-04 DIAGNOSIS — D1801 Hemangioma of skin and subcutaneous tissue: Secondary | ICD-10-CM | POA: Diagnosis not present

## 2024-01-04 DIAGNOSIS — L821 Other seborrheic keratosis: Secondary | ICD-10-CM | POA: Diagnosis not present

## 2024-01-04 DIAGNOSIS — Z85828 Personal history of other malignant neoplasm of skin: Secondary | ICD-10-CM | POA: Diagnosis not present

## 2024-01-14 ENCOUNTER — Other Ambulatory Visit: Payer: Self-pay | Admitting: Cardiology

## 2024-01-14 DIAGNOSIS — I480 Paroxysmal atrial fibrillation: Secondary | ICD-10-CM

## 2024-01-16 NOTE — Telephone Encounter (Signed)
 Prescription refill request for Eliquis  received. Indication:afib Last office visit:10/24 Scr:1.62  11/24 Age: 83 Weight:54.4  kg  Prescription refilled

## 2024-01-19 DIAGNOSIS — L538 Other specified erythematous conditions: Secondary | ICD-10-CM | POA: Diagnosis not present

## 2024-01-19 DIAGNOSIS — D485 Neoplasm of uncertain behavior of skin: Secondary | ICD-10-CM | POA: Diagnosis not present

## 2024-01-19 DIAGNOSIS — C44619 Basal cell carcinoma of skin of left upper limb, including shoulder: Secondary | ICD-10-CM | POA: Diagnosis not present

## 2024-01-19 DIAGNOSIS — C44319 Basal cell carcinoma of skin of other parts of face: Secondary | ICD-10-CM | POA: Diagnosis not present

## 2024-01-19 DIAGNOSIS — L820 Inflamed seborrheic keratosis: Secondary | ICD-10-CM | POA: Diagnosis not present

## 2024-01-19 DIAGNOSIS — C44712 Basal cell carcinoma of skin of right lower limb, including hip: Secondary | ICD-10-CM | POA: Diagnosis not present

## 2024-01-19 DIAGNOSIS — Z789 Other specified health status: Secondary | ICD-10-CM | POA: Diagnosis not present

## 2024-01-19 DIAGNOSIS — L2989 Other pruritus: Secondary | ICD-10-CM | POA: Diagnosis not present

## 2024-01-19 DIAGNOSIS — D0439 Carcinoma in situ of skin of other parts of face: Secondary | ICD-10-CM | POA: Diagnosis not present

## 2024-01-30 DIAGNOSIS — I129 Hypertensive chronic kidney disease with stage 1 through stage 4 chronic kidney disease, or unspecified chronic kidney disease: Secondary | ICD-10-CM | POA: Diagnosis not present

## 2024-01-30 DIAGNOSIS — Z1389 Encounter for screening for other disorder: Secondary | ICD-10-CM | POA: Diagnosis not present

## 2024-01-30 DIAGNOSIS — Z7901 Long term (current) use of anticoagulants: Secondary | ICD-10-CM | POA: Diagnosis not present

## 2024-01-30 DIAGNOSIS — N1831 Chronic kidney disease, stage 3a: Secondary | ICD-10-CM | POA: Diagnosis not present

## 2024-02-01 DIAGNOSIS — C44319 Basal cell carcinoma of skin of other parts of face: Secondary | ICD-10-CM | POA: Diagnosis not present

## 2024-02-01 DIAGNOSIS — C44712 Basal cell carcinoma of skin of right lower limb, including hip: Secondary | ICD-10-CM | POA: Diagnosis not present

## 2024-02-06 DIAGNOSIS — N1832 Chronic kidney disease, stage 3b: Secondary | ICD-10-CM | POA: Diagnosis not present

## 2024-02-06 DIAGNOSIS — Z1339 Encounter for screening examination for other mental health and behavioral disorders: Secondary | ICD-10-CM | POA: Diagnosis not present

## 2024-02-06 DIAGNOSIS — C4491 Basal cell carcinoma of skin, unspecified: Secondary | ICD-10-CM | POA: Diagnosis not present

## 2024-02-06 DIAGNOSIS — G2581 Restless legs syndrome: Secondary | ICD-10-CM | POA: Diagnosis not present

## 2024-02-06 DIAGNOSIS — G4700 Insomnia, unspecified: Secondary | ICD-10-CM | POA: Diagnosis not present

## 2024-02-06 DIAGNOSIS — R82998 Other abnormal findings in urine: Secondary | ICD-10-CM | POA: Diagnosis not present

## 2024-02-06 DIAGNOSIS — I8390 Asymptomatic varicose veins of unspecified lower extremity: Secondary | ICD-10-CM | POA: Diagnosis not present

## 2024-02-06 DIAGNOSIS — D696 Thrombocytopenia, unspecified: Secondary | ICD-10-CM | POA: Diagnosis not present

## 2024-02-06 DIAGNOSIS — D6869 Other thrombophilia: Secondary | ICD-10-CM | POA: Diagnosis not present

## 2024-02-06 DIAGNOSIS — I480 Paroxysmal atrial fibrillation: Secondary | ICD-10-CM | POA: Diagnosis not present

## 2024-02-06 DIAGNOSIS — I340 Nonrheumatic mitral (valve) insufficiency: Secondary | ICD-10-CM | POA: Diagnosis not present

## 2024-02-06 DIAGNOSIS — Z7901 Long term (current) use of anticoagulants: Secondary | ICD-10-CM | POA: Diagnosis not present

## 2024-02-06 DIAGNOSIS — I129 Hypertensive chronic kidney disease with stage 1 through stage 4 chronic kidney disease, or unspecified chronic kidney disease: Secondary | ICD-10-CM | POA: Diagnosis not present

## 2024-02-06 DIAGNOSIS — I872 Venous insufficiency (chronic) (peripheral): Secondary | ICD-10-CM | POA: Diagnosis not present

## 2024-02-06 DIAGNOSIS — Z0000 Encounter for general adult medical examination without abnormal findings: Secondary | ICD-10-CM | POA: Diagnosis not present

## 2024-02-06 DIAGNOSIS — Z1331 Encounter for screening for depression: Secondary | ICD-10-CM | POA: Diagnosis not present

## 2024-02-09 DIAGNOSIS — Z1231 Encounter for screening mammogram for malignant neoplasm of breast: Secondary | ICD-10-CM | POA: Diagnosis not present

## 2024-02-17 DIAGNOSIS — C44319 Basal cell carcinoma of skin of other parts of face: Secondary | ICD-10-CM | POA: Diagnosis not present

## 2024-02-17 DIAGNOSIS — M952 Other acquired deformity of head: Secondary | ICD-10-CM | POA: Diagnosis not present

## 2024-02-17 DIAGNOSIS — Z7901 Long term (current) use of anticoagulants: Secondary | ICD-10-CM | POA: Diagnosis not present

## 2024-02-17 DIAGNOSIS — D0439 Carcinoma in situ of skin of other parts of face: Secondary | ICD-10-CM | POA: Diagnosis not present

## 2024-02-17 DIAGNOSIS — H02834 Dermatochalasis of left upper eyelid: Secondary | ICD-10-CM | POA: Diagnosis not present

## 2024-02-17 DIAGNOSIS — I4891 Unspecified atrial fibrillation: Secondary | ICD-10-CM | POA: Diagnosis not present

## 2024-02-17 DIAGNOSIS — H02831 Dermatochalasis of right upper eyelid: Secondary | ICD-10-CM | POA: Diagnosis not present

## 2024-02-22 DIAGNOSIS — C44319 Basal cell carcinoma of skin of other parts of face: Secondary | ICD-10-CM | POA: Diagnosis not present

## 2024-02-22 DIAGNOSIS — C44619 Basal cell carcinoma of skin of left upper limb, including shoulder: Secondary | ICD-10-CM | POA: Diagnosis not present

## 2024-02-25 DIAGNOSIS — R000 Tachycardia, unspecified: Secondary | ICD-10-CM | POA: Diagnosis not present

## 2024-02-26 ENCOUNTER — Emergency Department (HOSPITAL_COMMUNITY)
Admission: EM | Admit: 2024-02-26 | Discharge: 2024-02-26 | Disposition: A | Discharge status: Home / Self Care | Attending: Emergency Medicine | Admitting: Emergency Medicine

## 2024-02-26 ENCOUNTER — Emergency Department (HOSPITAL_COMMUNITY)

## 2024-02-26 ENCOUNTER — Other Ambulatory Visit: Payer: Self-pay

## 2024-02-26 DIAGNOSIS — S79911A Unspecified injury of right hip, initial encounter: Secondary | ICD-10-CM | POA: Diagnosis present

## 2024-02-26 DIAGNOSIS — Z79899 Other long term (current) drug therapy: Secondary | ICD-10-CM | POA: Insufficient documentation

## 2024-02-26 DIAGNOSIS — Z7901 Long term (current) use of anticoagulants: Secondary | ICD-10-CM | POA: Diagnosis not present

## 2024-02-26 DIAGNOSIS — I4821 Permanent atrial fibrillation: Secondary | ICD-10-CM | POA: Diagnosis not present

## 2024-02-26 DIAGNOSIS — T84020A Dislocation of internal right hip prosthesis, initial encounter: Secondary | ICD-10-CM | POA: Diagnosis not present

## 2024-02-26 DIAGNOSIS — D696 Thrombocytopenia, unspecified: Secondary | ICD-10-CM | POA: Insufficient documentation

## 2024-02-26 DIAGNOSIS — Z96641 Presence of right artificial hip joint: Secondary | ICD-10-CM | POA: Diagnosis not present

## 2024-02-26 DIAGNOSIS — I10 Essential (primary) hypertension: Secondary | ICD-10-CM | POA: Diagnosis not present

## 2024-02-26 DIAGNOSIS — Y792 Prosthetic and other implants, materials and accessory orthopedic devices associated with adverse incidents: Secondary | ICD-10-CM | POA: Diagnosis not present

## 2024-02-26 DIAGNOSIS — S73004A Unspecified dislocation of right hip, initial encounter: Secondary | ICD-10-CM

## 2024-02-26 DIAGNOSIS — X509XXA Other and unspecified overexertion or strenuous movements or postures, initial encounter: Secondary | ICD-10-CM | POA: Insufficient documentation

## 2024-02-26 LAB — CBC WITH DIFFERENTIAL/PLATELET
Abs Immature Granulocytes: 0.05 K/uL (ref 0.00–0.07)
Basophils Absolute: 0.1 K/uL (ref 0.0–0.1)
Basophils Relative: 1 %
Eosinophils Absolute: 0.3 K/uL (ref 0.0–0.5)
Eosinophils Relative: 2 %
HCT: 40.6 % (ref 36.0–46.0)
Hemoglobin: 13.5 g/dL (ref 12.0–15.0)
Immature Granulocytes: 1 %
Lymphocytes Relative: 18 %
Lymphs Abs: 1.8 K/uL (ref 0.7–4.0)
MCH: 32.5 pg (ref 26.0–34.0)
MCHC: 33.3 g/dL (ref 30.0–36.0)
MCV: 97.6 fL (ref 80.0–100.0)
Monocytes Absolute: 0.7 K/uL (ref 0.1–1.0)
Monocytes Relative: 7 %
Neutro Abs: 7.4 K/uL (ref 1.7–7.7)
Neutrophils Relative %: 71 %
Platelets: 144 K/uL — ABNORMAL LOW (ref 150–400)
RBC: 4.16 MIL/uL (ref 3.87–5.11)
RDW: 13 % (ref 11.5–15.5)
WBC: 10.3 K/uL (ref 4.0–10.5)
nRBC: 0 % (ref 0.0–0.2)

## 2024-02-26 LAB — BASIC METABOLIC PANEL WITH GFR
Anion gap: 12 (ref 5–15)
BUN: 45 mg/dL — ABNORMAL HIGH (ref 8–23)
CO2: 23 mmol/L (ref 22–32)
Calcium: 9.8 mg/dL (ref 8.9–10.3)
Chloride: 102 mmol/L (ref 98–111)
Creatinine, Ser: 1.75 mg/dL — ABNORMAL HIGH (ref 0.44–1.00)
GFR, Estimated: 28 mL/min — ABNORMAL LOW (ref >60)
Glucose, Bld: 104 mg/dL — ABNORMAL HIGH (ref 70–99)
Potassium: 4.4 mmol/L (ref 3.5–5.1)
Sodium: 137 mmol/L (ref 135–145)

## 2024-02-26 MED ORDER — PROPOFOL 10 MG/ML IV BOLUS
0.5000 mg/kg | Freq: Once | INTRAVENOUS | Status: DC
  Filled 2024-02-26: qty 20

## 2024-02-26 MED ORDER — FENTANYL CITRATE (PF) 50 MCG/ML IJ SOSY
PREFILLED_SYRINGE | INTRAMUSCULAR | Status: AC
  Administered 2024-02-26: 25 ug
  Filled 2024-02-26: qty 1

## 2024-02-26 MED ORDER — PROPOFOL 10 MG/ML IV BOLUS
INTRAVENOUS | Status: AC | PRN
Start: 2024-02-26 — End: 2024-02-26
  Administered 2024-02-26: 20 mg via INTRAVENOUS
  Administered 2024-02-26 (×3): 10 mg via INTRAVENOUS

## 2024-02-26 NOTE — ED Triage Notes (Addendum)
 Pt arrived via EMS from home for R hip dislocation. Pt reports she was bending over and hip popped out. Hx of dislocation.  20g R FA 150mcg fent  fluids Hx. A fib

## 2024-02-26 NOTE — ED Provider Notes (Signed)
 [place] EMERGENCY DEPARTMENT AT [facility] Provider Note   CSN: [id number] Arrival date & time: 02/26/24  0013     Patient presents with: Hip Injury   Nancy Baxter is a 83 y.o. female.   The history is provided by the patient, the EMS personnel and medical records.  Nancy Baxter is a 83 y.o. female who presents to the Emergency Department complaining of hip dislocation.  She presents to the emergency department for evaluation after her right hip popped out at 1030.  This occurred when she was bending over to wipe some chocolate off her shoes.  She did not fall or hurt herself.  She did drink 3 ounces of wine tonight.  She does have a history of permanent atrial fibrillation on Eliquis .  She denies any recent illnesses or injuries.  She does live home by herself, she does have neighbors that are able to check on her frequently.   Prior to Admission medications   Medication Sig Start Date End Date Taking? Authorizing Provider  acetaminophen  (TYLENOL ) 650 MG CR tablet Take 1,300 mg by mouth 2 (two) times daily.    [provider]  apixaban  (ELIQUIS ) 2.5 MG TABS tablet Take 1 tablet by mouth twice daily 01/16/24   Ganji, Jay, MD  cyanocobalamin (VITAMIN B12) 1000 MCG tablet Take 2,000 mcg by mouth daily.    [provider]  doxycycline  (VIBRAMYCIN ) 100 MG capsule Take 1 capsule (100 mg total) by mouth 2 (two) times daily. 10/25/23   Dasie Faden, MD  ferrous sulfate  325 (65 FE) MG tablet Take 325 mg by mouth once a week.    [provider]  furosemide  (LASIX ) 40 MG tablet Take 40 mg by mouth.    [provider]  gabapentin  (NEURONTIN ) 100 MG capsule Take 200 mg by mouth at bedtime. RLS    [provider]  losartan  (COZAAR ) 100 MG tablet Take 100 mg by mouth in the morning. 05/29/20   [provider]  Magnesium  200 MG TABS Take 1 tablet by mouth daily.    [provider]  metoprolol  succinate (TOPROL -XL) 25 MG  24 hr tablet Take 25 mg by mouth daily.    [provider]  NON FORMULARY Take 2 tablets by mouth daily. Calcium, Magnesium , Zinc, Vitamin D    [provider]  trolamine salicylate (ASPERCREME) 10 % cream Apply 1 Application topically as needed for muscle pain.    [provider]    Allergies: Sulfa antibiotics, Codeine, Prednisone, Zolpidem tartrate, Adhesive [tape], and Diltiazem     Review of Systems  All other systems reviewed and are negative.   Updated Vital Signs BP 105/89   Pulse (!) 116   Temp 97.7 F (36.5 C) (Oral)   Resp 18   Wt 54.4 kg   SpO2 98%   BMI 25.07 kg/m   Physical Exam Vitals and nursing note reviewed.  Constitutional:      Appearance: She is well-developed.  HENT:     Head: Normocephalic and atraumatic.  Cardiovascular:     Rate and Rhythm: Tachycardia present. Rhythm irregular.     Heart sounds: No murmur heard. Pulmonary:     Effort: Pulmonary effort is normal. No respiratory distress.     Breath sounds: Normal breath sounds.  Abdominal:     Palpations: Abdomen is soft.     Tenderness: There is no abdominal tenderness. There is no guarding or rebound.  Musculoskeletal:        General:  No tenderness.     Comments: 2+ DP pulses bilaterally  Skin:    General: Skin is warm and dry.  Neurological:     Mental Status: She is alert and oriented to person, place, and time.  Psychiatric:        Behavior: Behavior normal.     (all labs ordered are listed, but only abnormal results are displayed) Labs Reviewed  BASIC METABOLIC PANEL WITH GFR - Abnormal; Notable for the following components:      Result Value   Glucose, Bld 104 (*)    BUN 45 (*)    Creatinine, Ser 1.75 (*)    GFR, Estimated 28 (*)    All other components within normal limits  CBC WITH DIFFERENTIAL/PLATELET - Abnormal; Notable for the following components:   Platelets 144 (*)    All other components within normal limits    EKG: EKG  Interpretation Date/Time:  Sunday February 26 2024 00:51:03 EST Ventricular Rate:  133 PR Interval:    QRS Duration:  86 QT Interval:  322 QTC Calculation: 479 R Axis:   7  Text Interpretation: Atrial fibrillation Abnormal R-wave progression, late transition Confirmed by Griselda Norris [phone]) on 02/26/2024 2:57:35 AM  Radiology: [place] Hip [place] Pelvis 1 View Right Result Date: 02/26/2024 EXAM: 1 VIEW XRAY OF THE RIGHT HIP 02/26/2024 02:09:00 AM COMPARISON: None available. CLINICAL HISTORY: reduction reduction FINDINGS: BONES AND JOINTS: Interval reduction of right total hip arthroplasty with arthroplasty components in their expected position. No unexpected fracture on the single view examination. SOFT TISSUES: The soft tissues are unremarkable. IMPRESSION: 1. Interval reduction of right total hip arthroplasty with arthroplasty components in their expected position. 2. No unexpected fracture on the single view examination. Electronically signed by: Dorethia Molt MD 02/26/2024 02:20 AM EST RP Workstation: [id number]   DG HIP UNILAT WITH PELVIS 2-3 VIEWS RIGHT Result Date: 02/26/2024 EXAM: 2 or 3 VIEW(S) XRAY OF THE RIGHT HIP 02/26/2024 01:17:23 AM COMPARISON: AP pelvis 08/06/2020. CLINICAL HISTORY: Hip pain. FINDINGS: BONES AND JOINTS: Superior dislocation of the right hip arthroplasty femoral component. The left hip replacement is normally located. No underlying fracture is suspected. Spurring of the SI joints and symphysis. Mild pelvic enthesopathy. SOFT TISSUES: Heterotopic ossification noted in the soft tissues lateral to the left hip. IMPRESSION: 1. Superior dislocation of the right hip arthroplasty femoral component. No underlying fracture is suspected. 2. Left hip arthroplasty is normally located. Electronically signed by: Francis Quam MD 02/26/2024 01:35 AM EST RP Workstation: [id number]     .Sedation  Date/Time: 02/26/2024 2:50 AM  Performed by: Griselda Norris,  MD Authorized by: Griselda Norris, MD   Consent:    Consent obtained:  Verbal   Consent given by:  Patient   Risks discussed:  Allergic reaction, dysrhythmia, inadequate sedation, nausea, prolonged hypoxia resulting in organ damage, prolonged sedation necessitating reversal, respiratory compromise necessitating ventilatory assistance and intubation and vomiting   Alternatives discussed:  Analgesia without sedation, anxiolysis and regional anesthesia Universal protocol:    Procedure explained and questions answered to patient or proxy's satisfaction: yes     Relevant documents present and verified: yes     Test results available: yes     Imaging studies available: yes     Required blood products, implants, devices, and special equipment available: yes     Site/side marked: yes     Immediately prior to procedure, a time out was called: yes     Patient identity confirmed:  Verbally with  patient Indications:    Procedure performed:  Dislocation reduction   Procedure necessitating sedation performed by:  Physician performing sedation Pre-sedation assessment:    Time since last food or drink:  6   ASA classification: class 3 - patient with severe systemic disease     Mouth opening:  3 or more finger widths   Thyromental distance:  4 finger widths   Mallampati score:  I - soft palate, uvula, fauces, pillars visible   Neck mobility: normal     Pre-sedation assessments completed and reviewed: airway patency, cardiovascular function, hydration status, mental status, nausea/vomiting, pain level, respiratory function and temperature   A pre-sedation assessment was completed prior to the start of the procedure Immediate pre-procedure details:    Reassessment: Patient reassessed immediately prior to procedure     Reviewed: vital signs, relevant labs/tests and NPO status     Verified: bag valve mask available, emergency equipment available, intubation equipment available, IV patency confirmed, oxygen  available and suction available   Procedure details (see MAR for exact dosages):    Preoxygenation:  Nasal cannula   Sedation:  Propofol    Intended level of sedation: deep   Analgesia:  Fentanyl    Intra-procedure monitoring:  Blood pressure monitoring, cardiac monitor, continuous pulse oximetry, frequent LOC assessments, frequent vital sign checks and continuous capnometry   Intra-procedure events: none     Total Provider sedation time (minutes):  15 Post-procedure details:   A post-sedation assessment was completed following the completion of the procedure.   Attendance: Constant attendance by certified staff until patient recovered     Recovery: Patient returned to pre-procedure baseline     Post-sedation assessments completed and reviewed: airway patency, cardiovascular function, hydration status, mental status, nausea/vomiting, pain level, respiratory function and temperature     Patient is stable for discharge or admission: yes     Procedure completion:  Tolerated well, no immediate complications .Reduction of dislocation  Date/Time: 02/26/2024 2:51 AM  Performed by: Griselda Norris, MD Authorized by: Griselda Norris, MD  Consent: Verbal consent obtained. Written consent obtained Risks and benefits: risks, benefits and alternatives were discussed Patient identity confirmed: verbally with patient Time out: Immediately prior to procedure a time out was called to verify the correct patient, procedure, equipment, support staff and site/side marked as required.  Sedation: Patient sedated: yes Sedatives: propofol   Patient tolerance: patient tolerated the procedure well with no immediate complications      Medications Ordered in the ED  propofol  (DIPRIVAN ) 10 mg/mL bolus/IV push 27.2 mg ( Intravenous Canceled Entry 02/26/24 0150)  fentaNYL  (SUBLIMAZE ) 50 MCG/ML injection (25 mcg  Given 02/26/24 0144)  propofol  (DIPRIVAN ) 10 mg/mL bolus/IV push (10 mg Intravenous Given 02/26/24  0150)                                    Medical Decision Making Amount and/or Complexity of Data Reviewed Labs: ordered. Radiology: ordered.   Patient here for evaluation of right hip dislocation that occurred spontaneously and prosthetic hip.  She does have good perfusion on examination.  Reduction performed per note after consent.  Patient tolerated procedure well and she was able to ambulate without difficulty.  Patient in A-fib, rates around 110 in the emergency department.  Discussed elevated heart rate.  She states that this is baseline for her.  Renal function is slightly worse when compared to prior in the system-discussed this with the patient.  CBC  with stable thrombocytopenia.  Discussed with patient hip precautions.  Discussed importance of orthopedics follow-up with return precautions.     Final diagnoses:  Dislocation of right hip, initial encounter [facility])    ED Discharge Orders     None          Griselda Norris, MD 02/26/24 [phone]

## 2024-02-26 NOTE — ED Notes (Signed)
 0143 TO 0144  Start 0144 0145 30ml prop 0147 10ml prop 0147 10ml prop 0148 10ml prop 0150 10ml prop
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
## 2024-02-28 ENCOUNTER — Ambulatory Visit: Attending: Physician Assistant | Admitting: Physician Assistant

## 2024-02-28 ENCOUNTER — Encounter: Payer: Self-pay | Admitting: Physician Assistant

## 2024-02-28 VITALS — BP 134/78 | HR 126 | Ht 59.0 in | Wt 118.0 lb

## 2024-02-28 DIAGNOSIS — I77819 Aortic ectasia, unspecified site: Secondary | ICD-10-CM

## 2024-02-28 DIAGNOSIS — I340 Nonrheumatic mitral (valve) insufficiency: Secondary | ICD-10-CM

## 2024-02-28 DIAGNOSIS — N189 Chronic kidney disease, unspecified: Secondary | ICD-10-CM

## 2024-02-28 DIAGNOSIS — I480 Paroxysmal atrial fibrillation: Secondary | ICD-10-CM

## 2024-02-28 DIAGNOSIS — I351 Nonrheumatic aortic (valve) insufficiency: Secondary | ICD-10-CM | POA: Diagnosis not present

## 2024-02-28 DIAGNOSIS — I471 Supraventricular tachycardia, unspecified: Secondary | ICD-10-CM

## 2024-02-28 DIAGNOSIS — N179 Acute kidney failure, unspecified: Secondary | ICD-10-CM | POA: Diagnosis not present

## 2024-02-28 MED ORDER — VERAPAMIL HCL ER 120 MG PO TBCR: 120.0000 mg | EXTENDED_RELEASE_TABLET | Freq: Every day | ORAL | 3 refills | Status: DC

## 2024-02-28 NOTE — Patient Instructions (Addendum)
 Medication Instructions:  Start Verapamil 120 mg take one tablet daily  *If you need a refill on your cardiac medications before your next appointment, please call your pharmacy*  Lab Work: Today- TSH, Free 4, BMET, Magnesium  If you have labs (blood work) drawn today and your tests are completely normal, you will receive your results only by: MyChart Message (if you have MyChart) OR A paper copy in the mail If you have any lab test that is abnormal or we need to change your treatment, we will call you to review the results.  Testing/Procedures: Your physician has requested that you have an echocardiogram. Echocardiography is a painless test that uses sound waves to create images of your heart. It provides your doctor with information about the size and shape of your heart and how well your heart's chambers and valves are working. This procedure takes approximately one hour. There are no restrictions for this procedure. Please do NOT wear cologne, perfume, aftershave, or lotions (deodorant is allowed). Please arrive 15 minutes prior to your appointment time.  Please note: We ask at that you not bring children with you during ultrasound (echo/ vascular) testing. Due to room size and safety concerns, children are not allowed in the ultrasound rooms during exams. Our front office staff cannot provide observation of children in our lobby area while testing is being conducted. An adult accompanying a patient to their appointment will only be allowed in the ultrasound room at the discretion of the ultrasound technician under special circumstances. We apologize for any inconvenience.   Follow-Up: Follow up with Afib Clinic in 1 week Your next appointment:   6 week(s)  Provider:   Gordy Bergamo, MD or Dayna Dunn, PA-C          We recommend signing up for the patient portal called MyChart.  Sign up information is provided on this After Visit Summary.  MyChart is used to connect with patients for  Virtual Visits (Telemedicine).  Patients are able to view lab/test results, encounter notes, upcoming appointments, etc.  Non-urgent messages can be sent to your provider as well.   To learn more about what you can do with MyChart, go to [email or web address].   Other Instructions Alcohol  or wine intake can provoke episodes of atrial fibrillation, consider reduction or discontinuation.  Referral sent to Nephrology

## 2024-02-29 ENCOUNTER — Ambulatory Visit: Payer: Self-pay | Admitting: Physician Assistant

## 2024-02-29 ENCOUNTER — Other Ambulatory Visit (HOSPITAL_COMMUNITY): Payer: Self-pay

## 2024-02-29 DIAGNOSIS — N1832 Chronic kidney disease, stage 3b: Secondary | ICD-10-CM

## 2024-02-29 LAB — T4, FREE: Free T4: 1.35 ng/dL (ref 0.82–1.77)

## 2024-02-29 LAB — BASIC METABOLIC PANEL WITH GFR
BUN/Creatinine Ratio: 22 (ref 12–28)
BUN: 36 mg/dL — ABNORMAL HIGH (ref 8–27)
CO2: 19 mmol/L — ABNORMAL LOW (ref 20–29)
Calcium: 9.6 mg/dL (ref 8.7–10.3)
Chloride: 99 mmol/L (ref 96–106)
Creatinine, Ser: 1.67 mg/dL — ABNORMAL HIGH (ref 0.57–1.00)
Glucose: 91 mg/dL (ref 70–99)
Potassium: 4.9 mmol/L (ref 3.5–5.2)
Sodium: 140 mmol/L (ref 134–144)
eGFR: 30 mL/min/1.73 — ABNORMAL LOW (ref >59)

## 2024-02-29 LAB — TSH: TSH: 2.38 u[IU]/mL (ref 0.450–4.500)

## 2024-02-29 LAB — MAGNESIUM: Magnesium: 2.3 mg/dL (ref 1.6–2.3)

## 2024-02-29 MED ORDER — LOSARTAN POTASSIUM 50 MG PO TABS
50.0000 mg | ORAL_TABLET | Freq: Every day | ORAL | 1 refills | Status: DC
  Filled 2024-02-29: qty 90, 90d supply, fill #0

## 2024-02-29 NOTE — Addendum Note (Signed)
 Addended by: Pegge Cumberledge N on: 02/29/2024 08:35 AM   Modules accepted: Orders

## 2024-03-02 NOTE — Telephone Encounter (Signed)
 Patient states she has been very dizzy since starting verapamil .  She took first dose Wednesday night, then took second dose Thursday morning (12 hours apart). She confirmed she is taking Losartan  50 mg as recommended, and now taking verapamil  with food.  Patient reports HR 65. No BP readings. She states she will try to have neighbor come to assist her with getting BP readings and will send to MyChart for review.  She will continue taking verapamil  in the morning. She would like to know if she should take verapamil , losartan  and metoprolol  at the same time (mornings) or if she should take them at different times throughout the day. Will forward to Dayna to review and advise.

## 2024-03-02 NOTE — Telephone Encounter (Addendum)
 Thank you for the update. If dizziness is new/severe or associated with other symptoms, recommend ER for evaluation. If she does not want to go to ER, would have her: - d/c verapamil  - keep losartan  at 50mg  daily for now - increase Toprol  from 25mg  daily to 50mg  daily. She is welcome to either take all at once or to split up dosing into 25mg  twice daily and get a little more even coverage. Doesn't matter what time of day taking losartan . She previously did not tolerate Toprol  75mg  daily but will see if she can tolerate 50mg  as she was doing back in 2023. - Please arrange 7 day non live Zio to assesss HR trends - Remind to return for BMET next week as per prior mychart msg - Follow BP at home and notify for any concerning low or high readings - In addition to Afib clinic for short term follow-up, let's go ahead and place a referral to EP as well as she may be someone who might need to be considered for ablation  HR of 65 noted though her watch was giving somewhat erratic readings in the office while in RVR. Med options are challenging due to recent intolerances - did not previously tolerate metoprolol  75mg  daily or diltiazem  low dose either. We attempted to arrange expedited Afib clnic follow-up, they notified us  the patient wished to defer appointment until after the holidays so not scheduled until 12/2.

## 2024-03-05 ENCOUNTER — Other Ambulatory Visit: Payer: Self-pay | Admitting: Physician Assistant

## 2024-03-05 ENCOUNTER — Ambulatory Visit: Attending: Physician Assistant

## 2024-03-05 DIAGNOSIS — N1832 Chronic kidney disease, stage 3b: Secondary | ICD-10-CM

## 2024-03-05 DIAGNOSIS — I493 Ventricular premature depolarization: Secondary | ICD-10-CM

## 2024-03-05 DIAGNOSIS — I480 Paroxysmal atrial fibrillation: Secondary | ICD-10-CM

## 2024-03-05 DIAGNOSIS — I471 Supraventricular tachycardia, unspecified: Secondary | ICD-10-CM

## 2024-03-05 MED ORDER — METOPROLOL SUCCINATE ER 50 MG PO TB24: 50.0000 mg | ORAL_TABLET | Freq: Every day | ORAL | 3 refills | Status: DC

## 2024-03-05 NOTE — Progress Notes (Unsigned)
Enrolled for [employer] to mail a ZIO XT long term holter monitor to the patients address on file.   Dr. Nadara Eaton to read.

## 2024-03-05 NOTE — Addendum Note (Signed)
 Addended by: JOSHUA ANDREZ PARAS on: 03/05/2024 08:59 AM   Modules accepted: Orders

## 2024-03-06 ENCOUNTER — Other Ambulatory Visit (HOSPITAL_COMMUNITY): Payer: Self-pay

## 2024-03-06 ENCOUNTER — Telehealth: Payer: Self-pay | Admitting: Physician Assistant

## 2024-03-06 MED ORDER — LOSARTAN POTASSIUM 100 MG PO TABS
100.0000 mg | ORAL_TABLET | Freq: Every day | ORAL | 3 refills | Status: AC
Start: 2024-03-06 — End: ?
  Filled 2024-03-06: qty 90, 90d supply, fill #0

## 2024-03-06 MED ORDER — METOPROLOL SUCCINATE ER 50 MG PO TB24
75.0000 mg | ORAL_TABLET | Freq: Every day | ORAL | 3 refills | Status: DC
  Filled 2024-03-06: qty 135, 90d supply, fill #0

## 2024-03-06 NOTE — Telephone Encounter (Signed)
 Called and spoke with patient regarding symptom/med update. She verifies that at time of recent OV, she had actually alredy been on Toprol  50mg  daily through primary care as she has been on for a long time. She stopped verapamil  11/21 with resolution of dizziness. Because she stopped the verapamil , she went back to the prior losartan  dose of 100mg  daily. Was able to review outside PCP records with Cr of 1.62 in 01/2024 - recheck by us  was 1.67 on 11/18 so will continue with regimen of losartan  100mg  daily for now, med list updated. Continue Lasix  40mg  daily as well (takes as two 20mg  tablets, updated med list).  She also clarifies that previously when there was mention of her not tolerating metoprolol  75mg  daily, it was actually 150mg  daily she was taking at the time as she was doing 3 of the 50mg  tablets at once, not the 25mg  tablets as thought.  I walked her through using the 30sec pulse check on her smartwatch with HR ranging 88, 92, 86, 106, 121. She remains completely asymptomatic and feeling great. Since she did not tolerate diltiazem  or verapamil , she would like to try going up gently on the metoprolol  to 75mg  daily - taking 1 1/2 tablets daily. We will keep the plan for 7day Zio, AFib clinic f/u, and referral to EP. ER precautions reviewed. Also reminded of need for repeat BMET. She verbalized understanding and gratitude.

## 2024-03-06 NOTE — Telephone Encounter (Signed)
 See new phone note created today.

## 2024-03-07 DIAGNOSIS — Z96642 Presence of left artificial hip joint: Secondary | ICD-10-CM | POA: Diagnosis not present

## 2024-03-13 ENCOUNTER — Encounter (HOSPITAL_COMMUNITY): Payer: Self-pay | Admitting: Internal Medicine

## 2024-03-13 ENCOUNTER — Ambulatory Visit (HOSPITAL_COMMUNITY)
Admission: RE | Admit: 2024-03-13 | Discharge: 2024-03-13 | Disposition: A | Source: Ambulatory Visit | Attending: Internal Medicine | Admitting: Internal Medicine

## 2024-03-13 VITALS — BP 136/98 | HR 121 | Ht 59.0 in | Wt 119.4 lb

## 2024-03-13 DIAGNOSIS — I080 Rheumatic disorders of both mitral and aortic valves: Secondary | ICD-10-CM

## 2024-03-13 DIAGNOSIS — I4819 Other persistent atrial fibrillation: Secondary | ICD-10-CM | POA: Diagnosis not present

## 2024-03-13 DIAGNOSIS — I480 Paroxysmal atrial fibrillation: Secondary | ICD-10-CM

## 2024-03-13 DIAGNOSIS — D6869 Other thrombophilia: Secondary | ICD-10-CM

## 2024-03-13 NOTE — Progress Notes (Signed)
 Primary Care Physician: Onita Rush, MD Primary Cardiologist: Gordy Bergamo, MD Electrophysiologist: None  Referring Physician: Raphael Bring, PA   Nancy Baxter is a 83 y.o. female with a history of paroxysmal AF (on Eliquis ), CKD stage III, venous insufficiency,HTN, mild to moderated MVR and AVR, recurrent hip dislocation, mild thrombocytopenia, PVCs, GERD, who presents for consultation in the [facility].  The patient was initially diagnosed with atrial fibrillation in 11/2021 after her Apple Watch indicated AF. She was started on Eliquis  2.5 mg and Toprol  was increased to 75 mg daily. She wore an event monitor that showed 1% AF/AFL burden with 184 episodes of PAT at 3.2% with rare PVCs.She was seen recently on 02/26/2024 with recurrent hip dislocation and was noted to be in A-fib with RVR with a rate of 133 bpm. She underwent reduction of hip in the ED and was seen by Raphael Bring, PA on 02/28/2024 for post ED follow-up. During visit she was still in A-fib with heart rates in the 110-120s but was completely asymptomatic. She reported missing a dose of Eliquis  tonight she was in the ED but reports being back on track with her regimen. She had a previous intolerance to Cardizem  and was started on verapamil  but developed dizziness and this was discontinued on 03/02/2024. She is currently on Toprol   XL 75 mg daily and wore an event monitor that is still pending results.She was also referred to EP by Lonell to discuss further treatment options.  Patient presents today for follow up for atrial fibrillation. She reports some fatigue and shortness of breath when walking her dog lately.  She has been compliant with her anticoagulant and denies any missed doses.  During today's visit we discussed treatment options for maintaining sinus rhythm and she elected to pursue a DCCV at this time.  We also discussed long-term the possibility of an ablation in the future and referral to our doctors to  see if she is a candidate.  We also reviewed triggers for atrial fibrillation and the importance of not missing any doses of her medications.  Today, she denies symptoms of palpitations, chest pain, shortness of breath, orthopnea, PND, lower extremity edema, dizziness, presyncope, syncope, snoring, daytime somnolence, bleeding, or neurologic sequela. The patient is tolerating medications without difficulties and is otherwise without complaint today.   Atrial Fibrillation Risk Factors:  she does not have symptoms or diagnosis of sleep apnea. she does not have a history of rheumatic fever. she reports occasional glass of wine with dinner The patient does not have a history of early familial atrial fibrillation or other arrhythmias.  Atrial Fibrillation Management history:  Previous antiarrhythmic drugs: None Previous cardioversions: None Previous ablations: None Anticoagulation history: Eliquis   ROS- All systems are reviewed and negative except as per the HPI above.  Past Medical History:  Diagnosis Date   A-fib (HCC)    Anemia    pt. denies   Arthritis    GERD (gastroesophageal reflux disease)    Heart murmur    evaluated 66yr ago-no cardiac follow up needed   Hypertension    Postop Acute blood loss anemia 01/06/2012   Postop Hyponatremia 01/06/2012   Postop Transfusion 01/07/2012   Restless leg     Current Outpatient Medications  Medication Sig Dispense Refill   acetaminophen  (TYLENOL ) 650 MG CR tablet Take 1,300 mg by mouth 2 (two) times daily.     apixaban  (ELIQUIS ) 2.5 MG TABS tablet Take 1 tablet by mouth twice daily 180  tablet 1   cyanocobalamin (VITAMIN B12) 1000 MCG tablet Take 2,000 mcg by mouth daily.     ferrous sulfate  325 (65 FE) MG tablet Take 325 mg by mouth once a week.     furosemide  (LASIX ) 20 MG tablet Take 40 mg by mouth daily.     gabapentin  (NEURONTIN ) 100 MG capsule Take 200 mg by mouth at bedtime. RLS     losartan  (COZAAR ) 100 MG tablet Take 1 tablet  (100 mg total) by mouth daily. (Patient taking differently: Take 50 mg by mouth daily.) 90 tablet 3   metoprolol  succinate (TOPROL -XL) 50 MG 24 hr tablet Take 1&1/2 tablets (75 mg total) by mouth daily. Take with or immediately following a meal. 135 tablet 3   NON FORMULARY Take 2 tablets by mouth daily. Calcium, Magnesium , Zinc, Vitamin D     No current facility-administered medications for this encounter.    Physical Exam: BP (!) 136/98   Pulse (!) 121   Ht 4' 11 (1.499 m)   Wt 54.2 kg   BMI 24.12 kg/m   GEN: Well nourished, well developed in no acute distress NECK: No JVD; No carotid bruits CARDIAC: Irregularly irregular tachycardic rate and rhythm, no murmurs, rubs, gallops RESPIRATORY:  Clear to auscultation without rales, wheezing or rhonchi  ABDOMEN: Soft, non-tender, non-distended EXTREMITIES:  No edema; No deformity   Wt Readings from Last 3 Encounters:  03/13/24 54.2 kg  02/28/24 53.5 kg  02/26/24 54.4 kg     EKG today demonstrates:   EKG Interpretation Date/Time:  Tuesday March 13 2024 15:03:39 EST Ventricular Rate:  121 PR Interval:    QRS Duration:  72 QT Interval:  320 QTC Calculation: 454 R Axis:   24  Text Interpretation: Atrial fibrillation with rapid ventricular response Anteroseptal infarct (cited on or before 28-Feb-2024) Abnormal ECG When compared with ECG of 28-Feb-2024 08:21, PREVIOUS ECG IS PRESENT Confirmed by Terra Pac ([id number]) on 03/13/2024 3:06:40 PM        Echo completed on 11/2021  demonstrated  Left ventricular ejection fraction, by estimation, is 60 to 65%. The  left ventricle has normal function. The left ventricle has no regional  wall motion abnormalities. Left ventricular diastolic parameters were  normal.   2. Right ventricular systolic function is normal. The right ventricular  size is normal. Tricuspid regurgitation signal is inadequate for assessing  PA pressure.   3. Left atrial size was moderately dilated.   4. The  mitral valve is normal in structure. Mild to moderate mitral valve  regurgitation.   5. The aortic valve is tricuspid. There is mild calcification of the  aortic valve. There is mild thickening of the aortic valve. Aortic valve  regurgitation is moderate. Aortic valve sclerosis is present, with no  evidence of aortic valve stenosis.   6. There is borderline dilatation of the ascending aorta, measuring 37  mm.   7. The inferior vena cava is dilated in size with >50% respiratory  variability, suggesting right atrial pressure of 8 mmHg.    CHA2DS2-VASc Score = 4  The patient's score is based upon: CHF History: 0 HTN History: 1 Diabetes History: 0 Stroke History: 0 Vascular Disease History: 0 Age Score: 2 Gender Score: 1      ASSESSMENT AND PLAN: Persistent Atrial Fibrillation (ICD10:  I48.0) The patient's CHA2DS2-VASc score is 4, indicating a 4.8% annual risk of stroke.   -Patient is still in atrial fibrillation with RVR today but notes heart rate is better controlled at home. -  We discussed various treatment and patient elected to pursue DCCV at this time. -She was instructed not to miss any scheduled doses of her anticoagulation. No missed doses at this time. -Patient will follow-up 2 weeks after cardioversion in our clinic to evaluate heart rate and discuss medications options or ablation in the future if indicated. -continue Eliquis  2.5 mg BID -Continue Toprol  XL 75 mg every day  VHD: -history of mitral and Aortic regurgitation with borderline aortic dialation -Last Echo 2023 with EF of 60- 65% and mild to moderated MVR and moderated AVR  Secondary Hypercoagulable State (ICD10:  D68.69) The patient is at significant risk for stroke/thromboembolism based upon her CHA2DS2-VASc Score of 4.  Continue Apixaban  (Eliquis ).    Signed,  Wyn Raddle, Jackee Shove, NP    03/13/2024 4:12 PM    Follow up in 2 weeks  Informed Consent   Shared Decision Making/Informed Consent The risks  (stroke, cardiac arrhythmias rarely resulting in the need for a temporary or permanent pacemaker, skin irritation or burns and complications associated with conscious sedation including aspiration, arrhythmia, respiratory failure and death), benefits (restoration of normal sinus rhythm) and alternatives of a direct current cardioversion were explained in detail to Ms. Goral and she agrees to proceed.     Jackee Wyn, NP-C [facility] [address] [place], [place] [address] [phone]

## 2024-03-13 NOTE — H&P (View-Only) (Signed)
 Primary Care Physician: Onita Rush, MD Primary Cardiologist: Gordy Bergamo, MD Electrophysiologist: None  Referring Physician: Raphael Bring, PA   Nancy Baxter is a 83 y.o. female with a history of paroxysmal AF (on Eliquis ), CKD stage III, venous insufficiency,HTN, mild to moderated MVR and AVR, recurrent hip dislocation, mild thrombocytopenia, PVCs, GERD, who presents for consultation in the [facility].  The patient was initially diagnosed with atrial fibrillation in 11/2021 after her Apple Watch indicated AF. She was started on Eliquis  2.5 mg and Toprol  was increased to 75 mg daily. She wore an event monitor that showed 1% AF/AFL burden with 184 episodes of PAT at 3.2% with rare PVCs.She was seen recently on 02/26/2024 with recurrent hip dislocation and was noted to be in A-fib with RVR with a rate of 133 bpm. She underwent reduction of hip in the ED and was seen by Raphael Bring, PA on 02/28/2024 for post ED follow-up. During visit she was still in A-fib with heart rates in the 110-120s but was completely asymptomatic. She reported missing a dose of Eliquis  tonight she was in the ED but reports being back on track with her regimen. She had a previous intolerance to Cardizem  and was started on verapamil  but developed dizziness and this was discontinued on 03/02/2024. She is currently on Toprol   XL 75 mg daily and wore an event monitor that is still pending results.She was also referred to EP by [place] to discuss further treatment options.  Patient presents today for follow up for atrial fibrillation. She reports some fatigue and shortness of breath when walking her dog lately.  She has been compliant with her anticoagulant and denies any missed doses.  During today's visit we discussed treatment options for maintaining sinus rhythm and she elected to pursue a DCCV at this time.  We also discussed long-term the possibility of an ablation in the future and referral to our doctors to  see if she is a candidate.  We also reviewed triggers for atrial fibrillation and the importance of not missing any doses of her medications.  Today, she denies symptoms of palpitations, chest pain, shortness of breath, orthopnea, PND, lower extremity edema, dizziness, presyncope, syncope, snoring, daytime somnolence, bleeding, or neurologic sequela. The patient is tolerating medications without difficulties and is otherwise without complaint today.   Atrial Fibrillation Risk Factors:  she does not have symptoms or diagnosis of sleep apnea. she does not have a history of rheumatic fever. she reports occasional glass of wine with dinner The patient does not have a history of early familial atrial fibrillation or other arrhythmias.  Atrial Fibrillation Management history:  Previous antiarrhythmic drugs: None Previous cardioversions: None Previous ablations: None Anticoagulation history: Eliquis   ROS- All systems are reviewed and negative except as per the HPI above.  Past Medical History:  Diagnosis Date   A-fib (HCC)    Anemia    pt. denies   Arthritis    GERD (gastroesophageal reflux disease)    Heart murmur    evaluated 66yr ago-no cardiac follow up needed   Hypertension    Postop Acute blood loss anemia 01/06/2012   Postop Hyponatremia 01/06/2012   Postop Transfusion 01/07/2012   Restless leg     Current Outpatient Medications  Medication Sig Dispense Refill   acetaminophen  (TYLENOL ) 650 MG CR tablet Take 1,300 mg by mouth 2 (two) times daily.     apixaban  (ELIQUIS ) 2.5 MG TABS tablet Take 1 tablet by mouth twice daily 180  tablet 1   cyanocobalamin (VITAMIN B12) 1000 MCG tablet Take 2,000 mcg by mouth daily.     ferrous sulfate  325 (65 FE) MG tablet Take 325 mg by mouth once a week.     furosemide  (LASIX ) 20 MG tablet Take 40 mg by mouth daily.     gabapentin  (NEURONTIN ) 100 MG capsule Take 200 mg by mouth at bedtime. RLS     losartan  (COZAAR ) 100 MG tablet Take 1 tablet  (100 mg total) by mouth daily. (Patient taking differently: Take 50 mg by mouth daily.) 90 tablet 3   metoprolol  succinate (TOPROL -XL) 50 MG 24 hr tablet Take 1&1/2 tablets (75 mg total) by mouth daily. Take with or immediately following a meal. 135 tablet 3   NON FORMULARY Take 2 tablets by mouth daily. Calcium, Magnesium , Zinc, Vitamin D     No current facility-administered medications for this encounter.    Physical Exam: BP (!) 136/98   Pulse (!) 121   Ht 4' 11 (1.499 m)   Wt 54.2 kg   BMI 24.12 kg/m   GEN: Well nourished, well developed in no acute distress NECK: No JVD; No carotid bruits CARDIAC: Irregularly irregular tachycardic rate and rhythm, no murmurs, rubs, gallops RESPIRATORY:  Clear to auscultation without rales, wheezing or rhonchi  ABDOMEN: Soft, non-tender, non-distended EXTREMITIES:  No edema; No deformity   Wt Readings from Last 3 Encounters:  03/13/24 54.2 kg  02/28/24 53.5 kg  02/26/24 54.4 kg     EKG today demonstrates:   EKG Interpretation Date/Time:  Tuesday March 13 2024 15:03:39 EST Ventricular Rate:  121 PR Interval:    QRS Duration:  72 QT Interval:  320 QTC Calculation: 454 R Axis:   24  Text Interpretation: Atrial fibrillation with rapid ventricular response Anteroseptal infarct (cited on or before 28-Feb-2024) Abnormal ECG When compared with ECG of 28-Feb-2024 08:21, PREVIOUS ECG IS PRESENT Confirmed by Terra Pac ([id number]) on 03/13/2024 3:06:40 PM        Echo completed on 11/2021  demonstrated  Left ventricular ejection fraction, by estimation, is 60 to 65%. The  left ventricle has normal function. The left ventricle has no regional  wall motion abnormalities. Left ventricular diastolic parameters were  normal.   2. Right ventricular systolic function is normal. The right ventricular  size is normal. Tricuspid regurgitation signal is inadequate for assessing  PA pressure.   3. Left atrial size was moderately dilated.   4. The  mitral valve is normal in structure. Mild to moderate mitral valve  regurgitation.   5. The aortic valve is tricuspid. There is mild calcification of the  aortic valve. There is mild thickening of the aortic valve. Aortic valve  regurgitation is moderate. Aortic valve sclerosis is present, with no  evidence of aortic valve stenosis.   6. There is borderline dilatation of the ascending aorta, measuring 37  mm.   7. The inferior vena cava is dilated in size with >50% respiratory  variability, suggesting right atrial pressure of 8 mmHg.    CHA2DS2-VASc Score = 4  The patient's score is based upon: CHF History: 0 HTN History: 1 Diabetes History: 0 Stroke History: 0 Vascular Disease History: 0 Age Score: 2 Gender Score: 1      ASSESSMENT AND PLAN: Persistent Atrial Fibrillation (ICD10:  I48.0) The patient's CHA2DS2-VASc score is 4, indicating a 4.8% annual risk of stroke.   -Patient is still in atrial fibrillation with RVR today but notes heart rate is better controlled at home. -  We discussed various treatment and patient elected to pursue DCCV at this time. -She was instructed not to miss any scheduled doses of her anticoagulation. No missed doses at this time. -Patient will follow-up 2 weeks after cardioversion in our clinic to evaluate heart rate and discuss medications options or ablation in the future if indicated. -continue Eliquis  2.5 mg BID -Continue Toprol  XL 75 mg every day  VHD: -history of mitral and Aortic regurgitation with borderline aortic dialation -Last Echo 2023 with EF of 60- 65% and mild to moderated MVR and moderated AVR  Secondary Hypercoagulable State (ICD10:  D68.69) The patient is at significant risk for stroke/thromboembolism based upon her CHA2DS2-VASc Score of 4.  Continue Apixaban  (Eliquis ).    Signed,  Wyn Raddle, Jackee Shove, NP    03/13/2024 4:12 PM    Follow up in 2 weeks  Informed Consent   Shared Decision Making/Informed Consent The risks  (stroke, cardiac arrhythmias rarely resulting in the need for a temporary or permanent pacemaker, skin irritation or burns and complications associated with conscious sedation including aspiration, arrhythmia, respiratory failure and death), benefits (restoration of normal sinus rhythm) and alternatives of a direct current cardioversion were explained in detail to Ms. Goral and she agrees to proceed.     Jackee Wyn, NP-[facility] [address] [place], [place] [address] [phone]

## 2024-03-13 NOTE — Patient Instructions (Addendum)
 Cardioversion scheduled for12/9/25 Tuesday 8:00 am    - Arrive at the [facility] ([address]  and check in with ADMITTING  8:00am    - Do not eat or drink anything after midnight the night prior to your procedure.   - Take all your morning medication (except diabetic medications) with a sip of water  prior to arrival.  - Do NOT miss any doses of your blood thinner - if you should miss a dose or take a dose more than 4 hours late -- please notify our office immediately.  - You will not be able to drive home after your procedure. Please ensure you have a responsible adult to drive you home. You will need someone with you for 24 hours post procedure.     - Expect to be in the procedural area approximately 2 hours.   - If you feel as if you go back into normal rhythm prior to scheduled cardioversion, please notify our office immediately.   If your procedure is canceled in the cardioversion suite you will be charged a cancellation fee.

## 2024-03-14 LAB — BASIC METABOLIC PANEL WITH GFR
BUN/Creatinine Ratio: 26 (ref 12–28)
BUN: 44 mg/dL — ABNORMAL HIGH (ref 8–27)
CO2: 19 mmol/L — ABNORMAL LOW (ref 20–29)
Calcium: 9.3 mg/dL (ref 8.7–10.3)
Chloride: 100 mmol/L (ref 96–106)
Creatinine, Ser: 1.69 mg/dL — ABNORMAL HIGH (ref 0.57–1.00)
Glucose: 122 mg/dL — ABNORMAL HIGH (ref 70–99)
Potassium: 4.4 mmol/L (ref 3.5–5.2)
Sodium: 137 mmol/L (ref 134–144)
eGFR: 30 mL/min/1.73 — ABNORMAL LOW (ref >59)

## 2024-03-19 NOTE — Progress Notes (Signed)
 Called patient and left a message to their voicemail stating time to be here for procedure is 0800, to remain NPO after midnight besides a sip of water  with the medications listed in the voicemail including their blood thinner, and to have a ride home secured prior to procedure. Callback number was given if they had any further questions.

## 2024-03-20 ENCOUNTER — Encounter (HOSPITAL_COMMUNITY): Admission: RE | Disposition: A | Payer: Self-pay | Attending: Cardiology

## 2024-03-20 ENCOUNTER — Ambulatory Visit (HOSPITAL_COMMUNITY)
Admission: RE | Admit: 2024-03-20 | Discharge: 2024-03-20 | Disposition: A | Attending: Cardiology | Admitting: Cardiology

## 2024-03-20 ENCOUNTER — Other Ambulatory Visit: Payer: Self-pay

## 2024-03-20 ENCOUNTER — Ambulatory Visit (HOSPITAL_COMMUNITY): Admitting: Certified Registered"

## 2024-03-20 DIAGNOSIS — I10 Essential (primary) hypertension: Secondary | ICD-10-CM | POA: Diagnosis not present

## 2024-03-20 DIAGNOSIS — D6869 Other thrombophilia: Secondary | ICD-10-CM | POA: Diagnosis not present

## 2024-03-20 DIAGNOSIS — I4891 Unspecified atrial fibrillation: Secondary | ICD-10-CM | POA: Diagnosis not present

## 2024-03-20 DIAGNOSIS — Z79899 Other long term (current) drug therapy: Secondary | ICD-10-CM | POA: Diagnosis not present

## 2024-03-20 DIAGNOSIS — I080 Rheumatic disorders of both mitral and aortic valves: Secondary | ICD-10-CM | POA: Diagnosis not present

## 2024-03-20 DIAGNOSIS — K219 Gastro-esophageal reflux disease without esophagitis: Secondary | ICD-10-CM | POA: Diagnosis not present

## 2024-03-20 DIAGNOSIS — Z7901 Long term (current) use of anticoagulants: Secondary | ICD-10-CM | POA: Diagnosis not present

## 2024-03-20 DIAGNOSIS — I4819 Other persistent atrial fibrillation: Secondary | ICD-10-CM | POA: Diagnosis present

## 2024-03-20 DIAGNOSIS — Z006 Encounter for examination for normal comparison and control in clinical research program: Secondary | ICD-10-CM

## 2024-03-20 HISTORY — PX: CARDIOVERSION: EP1203

## 2024-03-20 SURGERY — CARDIOVERSION (CATH LAB)
Anesthesia: General

## 2024-03-20 MED ORDER — SODIUM CHLORIDE 0.9 % IV SOLN: INTRAVENOUS | Status: DC

## 2024-03-20 MED ORDER — PROPOFOL 10 MG/ML IV BOLUS
INTRAVENOUS | Status: DC | PRN
  Administered 2024-03-20: 40 mg via INTRAVENOUS

## 2024-03-20 SURGICAL SUPPLY — 1 items: PAD DEFIB RADIO PHYSIO CONN (PAD) ×1 IMPLANT

## 2024-03-20 NOTE — Anesthesia Postprocedure Evaluation (Signed)
 Anesthesia Post Note  Patient: Nancy Baxter  Procedure(s) Performed: CARDIOVERSION     Patient location during evaluation: Cath Lab Anesthesia Type: General Level of consciousness: awake and alert Pain management: pain level controlled Vital Signs Assessment: post-procedure vital signs reviewed and stable Respiratory status: spontaneous breathing, nonlabored ventilation and respiratory function stable Cardiovascular status: blood pressure returned to baseline and stable Postop Assessment: no apparent nausea or vomiting Anesthetic complications: no   No notable events documented.  Last Vitals:  Vitals:   03/20/24 0739 03/20/24 0853  BP: (!) 143/109 94/61  Pulse: (!) 130 64  Resp: 16 (!) 22  Temp: 36.4 C   SpO2: 98% 96%    Last Pain:  Vitals:   03/20/24 0853  TempSrc:   PainSc: 0-No pain                 Roisin Mones

## 2024-03-20 NOTE — Interval H&P Note (Signed)
 History and Physical Interval Note:  03/20/2024 8:16 AM  Heron KATHEE Agent  has presented today for surgery, with the diagnosis of AFIB.  The various methods of treatment have been discussed with the patient and family. After consideration of risks, benefits and other options for treatment, the patient has consented to  Procedure(s): CARDIOVERSION (N/A) as a surgical intervention.  The patient's history has been reviewed, patient examined, no change in status, stable for surgery.  I have reviewed the patient's chart and labs.  Questions were answered to the patient's satisfaction.     [employer]

## 2024-03-20 NOTE — Anesthesia Preprocedure Evaluation (Signed)
 Anesthesia Evaluation  Patient identified by MRN, date of birth, ID band Patient awake    Reviewed: Allergy & Precautions, NPO status , Patient's Chart, lab work & pertinent test results  History of Anesthesia Complications Negative for: history of anesthetic complications  Airway Mallampati: II  TM Distance: >3 FB Neck ROM: Full    Dental  (+) Dental Advisory Given, Teeth Intact   Pulmonary neg pulmonary ROS, neg shortness of breath, neg COPD, neg recent URI   breath sounds clear to auscultation       Cardiovascular hypertension, Pt. on medications and Pt. on home beta blockers + dysrhythmias (on Eliquis ) Atrial Fibrillation  Rhythm:Irregular  TTE 11/2021: EF 60-65%, moderate LAE, mild to moderate MR, moderate AR   Neuro/Psych BPPV, RLS negative neurological ROS  negative psych ROS   GI/Hepatic Neg liver ROS,GERD  ,,  Endo/Other  negative endocrine ROS    Renal/GU Renal InsufficiencyRenal disease (Cr 1.59)  negative genitourinary   Musculoskeletal  (+) Arthritis ,    Abdominal   Peds  Hematology  (+) Blood dyscrasia Lab Results      Component                Value               Date                      WBC                      10.3                02/26/2024                HGB                      13.5                02/26/2024                HCT                      40.6                02/26/2024                MCV                      97.6                02/26/2024                PLT                      144 (L)             02/26/2024              Anesthesia Other Findings   Reproductive/Obstetrics negative OB ROS                              Anesthesia Physical Anesthesia Plan  ASA: 2  Anesthesia Plan: General   Post-op Pain Management: Minimal or no pain anticipated   Induction:   PONV Risk Score and Plan: 3 and Propofol  infusion and Treatment may vary due to age or medical  condition  Airway Management Planned: Simple Face  Mask, Natural Airway and Nasal Cannula  Additional Equipment: None  Intra-op Plan:   Post-operative Plan:   Informed Consent: I have reviewed the patients History and Physical, chart, labs and discussed the procedure including the risks, benefits and alternatives for the proposed anesthesia with the patient or authorized representative who has indicated his/her understanding and acceptance.     Dental advisory given  Plan Discussed with: CRNA  Anesthesia Plan Comments: (See PAT note 07/08/2022)        Anesthesia Quick Evaluation

## 2024-03-20 NOTE — Transfer of Care (Signed)
 Immediate Anesthesia Transfer of Care Note  Patient: Nancy Baxter  Procedure(s) Performed: CARDIOVERSION  Patient Location: Cath Lab  Anesthesia Type:General  Level of Consciousness: drowsy and patient cooperative  Airway & Oxygen Therapy: Patient Spontanous Breathing and Patient connected to nasal cannula oxygen  Post-op Assessment: Report given to RN and Post -op Vital signs reviewed and stable  Post vital signs: Reviewed and stable  Last Vitals:  Vitals Value Taken Time  BP 92/66 03/20/2024 0849  Temp 37 C 03/20/2024 0849  Pulse 58 03/20/2024 0849  Resp 14 03/20/2024 0849  SpO2 91% 03/20/2024 0849    Last Pain:  Vitals:   03/20/24 0739  TempSrc: Temporal         Complications: No notable events documented.

## 2024-03-20 NOTE — Research (Signed)
 [facility] Cardioversion Informed Consent   Subject Name: Nancy Baxter  Subject met inclusion and exclusion criteria.  The informed consent form, study requirements and expectations were reviewed with the subject and questions and concerns were addressed prior to the signing of the consent form.  The subject verbalized understanding of the trial requirements.  The subject agreed to participate in the [facility] Cardioversion trial and signed the informed consent at 0758 on 09/Dec/2025.  The informed consent was obtained prior to performance of any protocol-specific procedures for the subject.  A copy of the signed informed consent was given to the subject and a copy was placed in the subject's medical record.   [name] Demarious Kapur

## 2024-03-20 NOTE — CV Procedure (Signed)
    Electrical Cardioversion Procedure Note Nancy Baxter [id number] 1940-06-25  Procedure: Electrical Cardioversion Indications:  Atrial Fibrillation  Time Out: Verified patient identification, verified procedure,medications/allergies/relevent history reviewed, required imaging and test results available.  Performed  Procedure Details  The patient was NPO after midnight. Anesthesia was administered at the beside  by Dr.Moser with 150mg  of propofol .  Cardioversion was performed with synchronized biphasic defibrillation via AP pads with 1 joules.  1 attempt(s) were performed.  The patient converted to normal sinus rhythm. The patient tolerated the procedure well   IMPRESSION:  Successful cardioversion of atrial fibrillation    Nancy Baxter 03/20/2024, 8:48 AM

## 2024-03-21 ENCOUNTER — Encounter (HOSPITAL_COMMUNITY): Payer: Self-pay | Admitting: Cardiology

## 2024-03-26 NOTE — Telephone Encounter (Signed)
 Spoke with patient. Overall asymptomatic. Pt hesitant to make any changes or move up appointment related to holidays and being very busy.  Pt will call me back with her heart rates after taking medications this morning to ensure no rate control changes needed until follow up 12/29.

## 2024-04-04 ENCOUNTER — Ambulatory Visit (HOSPITAL_COMMUNITY): Admitting: Internal Medicine

## 2024-04-09 ENCOUNTER — Other Ambulatory Visit (HOSPITAL_BASED_OUTPATIENT_CLINIC_OR_DEPARTMENT_OTHER): Payer: Self-pay

## 2024-04-09 ENCOUNTER — Encounter (HOSPITAL_COMMUNITY): Payer: Self-pay | Admitting: Internal Medicine

## 2024-04-09 ENCOUNTER — Ambulatory Visit (HOSPITAL_COMMUNITY)
Admission: RE | Admit: 2024-04-09 | Discharge: 2024-04-09 | Disposition: A | Discharge status: Home / Self Care | Source: Ambulatory Visit | Attending: Internal Medicine | Admitting: Internal Medicine

## 2024-04-09 ENCOUNTER — Other Ambulatory Visit (HOSPITAL_COMMUNITY): Payer: Self-pay

## 2024-04-09 VITALS — BP 124/76 | HR 115 | Ht 59.0 in | Wt 116.4 lb

## 2024-04-09 DIAGNOSIS — I351 Nonrheumatic aortic (valve) insufficiency: Secondary | ICD-10-CM | POA: Diagnosis not present

## 2024-04-09 DIAGNOSIS — D6859 Other primary thrombophilia: Secondary | ICD-10-CM | POA: Diagnosis not present

## 2024-04-09 DIAGNOSIS — I4891 Unspecified atrial fibrillation: Secondary | ICD-10-CM

## 2024-04-09 DIAGNOSIS — I4819 Other persistent atrial fibrillation: Secondary | ICD-10-CM

## 2024-04-09 MED ORDER — OMRON 3 SERIES BP MONITOR DEVI
1.0000 | Freq: Every day | 0 refills | Status: AC
  Filled 2024-04-09: qty 1, 30d supply, fill #0

## 2024-04-09 MED ORDER — AMIODARONE HCL 200 MG PO TABS: ORAL_TABLET | ORAL | 1 refills | Status: DC

## 2024-04-09 MED ORDER — METOPROLOL SUCCINATE ER 50 MG PO TB24: 50.0000 mg | ORAL_TABLET | Freq: Every day | ORAL | 3 refills | Status: AC

## 2024-04-09 NOTE — Patient Instructions (Signed)
 Decrease metoprolol  to 50 mg once daily   Start Amiodarone 200 mg twice a day for 30 days then decrease to 200 mg once a day

## 2024-04-09 NOTE — Progress Notes (Signed)
 "   Primary Care Physician: Onita Rush, MD Primary Cardiologist: Gordy Bergamo, MD Electrophysiologist: None  Referring Physician: Raphael Bring, PA   Nancy Baxter is a 83 y.o. female with a history of paroxysmal AF (on Eliquis ), CKD stage III, venous insufficiency,HTN, mild to moderated MVR and AVR, recurrent hip dislocation, mild thrombocytopenia, PVCs, GERD, who presents for consultation in the [facility].  The patient was initially diagnosed with atrial fibrillation in 11/2021 after her Apple Watch indicated AF. She was started on Eliquis  2.5 mg and Toprol  was increased to 75 mg daily. She wore an event monitor that showed 1% AF/AFL burden with 184 episodes of PAT at 3.2% with rare PVCs.She was seen recently on 02/26/2024 with recurrent hip dislocation and was noted to be in A-fib with RVR with a rate of 133 bpm. She underwent reduction of hip in the ED and was seen by Raphael Bring, PA on 02/28/2024 for post ED follow-up. During visit she was still in A-fib with heart rates in the 110-120s but was completely asymptomatic. She reported missing a dose of Eliquis  tonight she was in the ED but reports being back on track with her regimen. She had a previous intolerance to Cardizem  and was started on verapamil  but developed dizziness and this was discontinued on 03/02/2024. She is currently on Toprol   XL 75 mg daily and wore an event monitor that is still pending results.She was also referred to EP by Lonell to discuss further treatment options. [name] was last seen on 03/13/2024 for A-fib follow-up and reported fatigue and shortness of breath while walking her dog.  She was found to be in AF with RVR during her visit.  She had not missed any doses of her Eliquis  and was scheduled for DCCV which took place on 03/20/2024 with successful conversion to sinus rhythm.  She was advised that if she experiences an early return that medications may be necessary at that time.  [name] presents  today for follow-up after DCCV.  She was able to maintain sinus rhythm for only 4 days and has been symptomatic with increased fatigue.  She has been compliant with her Eliquis  and denies any missed doses.  We discussed the best path moving forward for managing her atrial fibrillation and medications were discussed consisting of amiodarone and Multaq.  Through a shared decision she is in favor of beginning amiodarone and will be loaded today.  We will have her return in 3 weeks to evaluate her EKG and to see if she has chemically converted.  We will pursue repeat DCCV if she is not converted at 3-week.   Today, she denies symptoms of palpitations, chest pain, shortness of breath, orthopnea, PND, lower extremity edema, dizziness, presyncope, syncope, snoring, daytime somnolence, bleeding, or neurologic sequela. The patient is tolerating medications without difficulties and is otherwise without complaint today.   Atrial Fibrillation Risk Factors:  she does not have symptoms or diagnosis of sleep apnea. she does not have a history of rheumatic fever. she reports occasional glass of wine with dinner The patient does not have a history of early familial atrial fibrillation or other arrhythmias.  Atrial Fibrillation Management history:  Previous antiarrhythmic drugs: None Previous cardioversions: None Previous ablations: None Anticoagulation history: Eliquis   ROS- All systems are reviewed and negative except as per the HPI above.  Past Medical History:  Diagnosis Date   A-fib (HCC)    Anemia    pt. denies   Arthritis  GERD (gastroesophageal reflux disease)    Heart murmur    evaluated 43yr ago-no cardiac follow up needed   Hypertension    Postop Acute blood loss anemia 01/06/2012   Postop Hyponatremia 01/06/2012   Postop Transfusion 01/07/2012   Restless leg     Current Outpatient Medications  Medication Sig Dispense Refill   acetaminophen  (TYLENOL ) 650 MG CR tablet Take 1,300 mg  by mouth 2 (two) times daily.     ALPRAZolam (XANAX) 0.25 MG tablet Take 1/2 - 1tablet by mouth at bedtime as needed Orally as directed; Duration: 10 days     amiodarone (PACERONE) 200 MG tablet Take 1 tablet (200 mg total) by mouth 2 (two) times daily for 30 days, THEN 1 tablet (200 mg total) daily. 60 tablet 1   apixaban  (ELIQUIS ) 2.5 MG TABS tablet Take 1 tablet by mouth twice daily 180 tablet 1   Cyanocobalamin (VITAMIN B-12 PO) Take 2,500 mcg by mouth daily.     ferrous sulfate  325 (65 FE) MG tablet Take 325 mg by mouth once a week.     furosemide  (LASIX ) 20 MG tablet Take 40 mg by mouth in the morning.     gabapentin  (NEURONTIN ) 100 MG capsule Take 200 mg by mouth at bedtime. RLS     losartan  (COZAAR ) 100 MG tablet Take 1 tablet (100 mg total) by mouth daily. (Patient taking differently: Take 50 mg by mouth daily.) 90 tablet 3   zinc gluconate 50 MG tablet Take 100 mg by mouth in the morning.     metoprolol  succinate (TOPROL -XL) 50 MG 24 hr tablet Take 1 tablet (50 mg total) by mouth daily. Decrease dose 90 tablet 3   No current facility-administered medications for this encounter.    Physical Exam: BP 124/76   Pulse (!) 115   Ht 4' 11 (1.499 m)   Wt 52.8 kg   BMI 23.51 kg/m   GEN: Well nourished, well developed in no acute distress NECK: No JVD; No carotid bruits CARDIAC: Irregularly irregular tachycardic rate and rhythm, no murmurs, rubs, gallops RESPIRATORY:  Clear to auscultation without rales, wheezing or rhonchi  ABDOMEN: Soft, non-tender, non-distended EXTREMITIES:  No edema; No deformity   Wt Readings from Last 3 Encounters:  04/09/24 52.8 kg  03/20/24 51.3 kg  03/13/24 54.2 kg     EKG today demonstrates:  EKG Interpretation Date/Time:  Monday April 09 2024 14:55:36 EST Ventricular Rate:  115 PR Interval:    QRS Duration:  72 QT Interval:  342 QTC Calculation: 473 R Axis:   18  Text Interpretation: Atrial fibrillation with rapid ventricular response Low  voltage QRS Confirmed by Wyn Manus [phone]) on 04/09/2024 3:33:16 PM     Echo completed on 11/2021  demonstrated  Left ventricular ejection fraction, by estimation, is 60 to 65%. The  left ventricle has normal function. The left ventricle has no regional  wall motion abnormalities. Left ventricular diastolic parameters were  normal.   2. Right ventricular systolic function is normal. The right ventricular  size is normal. Tricuspid regurgitation signal is inadequate for assessing  PA pressure.   3. Left atrial size was moderately dilated.   4. The mitral valve is normal in structure. Mild to moderate mitral valve  regurgitation.   5. The aortic valve is tricuspid. There is mild calcification of the  aortic valve. There is mild thickening of the aortic valve. Aortic valve  regurgitation is moderate. Aortic valve sclerosis is present, with no  evidence of aortic valve  stenosis.   6. There is borderline dilatation of the ascending aorta, measuring 37  mm.   7. The inferior vena cava is dilated in size with >50% respiratory  variability, suggesting right atrial pressure of 8 mmHg.    CHA2DS2-VASc Score = 4  The patient's score is based upon: CHF History: 0 HTN History: 1 Diabetes History: 0 Stroke History: 0 Vascular Disease History: 0 Age Score: 2 Gender Score: 1      ASSESSMENT AND PLAN: Persistent Atrial Fibrillation (ICD10:  I48.0) The patient's CHA2DS2-VASc score is 4, indicating a 4.8% annual risk of stroke.   -s/p DCCV on 03/20/2024 with ERAF and today he is currently in symptomatic AF with RVR and rate of 115.   -Start amiodarone 200 mg twice daily x 4 weeks -Patient will return in 3 weeks for EKG check and need for repeat DCCV if not converted to sinus rhythm - Continue Eliquis  5 mg twice daily - We will decrease Toprol -XL to 50 mg once daily -Patient was advised to obtain BP cuff and measure blood pressure with goal of systolic greater than 100  - Patient advised to  go to the ED if she experiences increased symptoms with atrial fibrillation and was also provided our office number.  VHD: -history of mitral and Aortic regurgitation with borderline aortic dialation -Last Echo 2023 with EF of 60- 65% and mild to moderated MVR and moderated AVR - Continue Lasix  40 mg daily  HTN: -BP well controlled. Continue current antihypertensive regimen.   Secondary Hypercoagulable State (ICD10:  D68.69) The patient is at significant risk for stroke/thromboembolism based upon her CHA2DS2-VASc Score of 4.  Continue Apixaban  (Eliquis ).    Signed,  Wyn Raddle, Jackee Shove, NP    04/09/2024 3:33 PM       "

## 2024-04-13 ENCOUNTER — Other Ambulatory Visit (HOSPITAL_BASED_OUTPATIENT_CLINIC_OR_DEPARTMENT_OTHER): Payer: Self-pay

## 2024-04-16 ENCOUNTER — Ambulatory Visit (HOSPITAL_COMMUNITY)
Admission: RE | Admit: 2024-04-16 | Discharge: 2024-04-16 | Disposition: A | Discharge status: Home / Self Care | Source: Ambulatory Visit | Attending: Physician Assistant | Admitting: Physician Assistant

## 2024-04-16 DIAGNOSIS — I480 Paroxysmal atrial fibrillation: Secondary | ICD-10-CM | POA: Diagnosis present

## 2024-04-16 LAB — ECHOCARDIOGRAM COMPLETE
P 1/2 time: 597 ms
S' Lateral: 2.6 cm

## 2024-04-27 DIAGNOSIS — I480 Paroxysmal atrial fibrillation: Secondary | ICD-10-CM | POA: Diagnosis not present

## 2024-04-27 DIAGNOSIS — I493 Ventricular premature depolarization: Secondary | ICD-10-CM | POA: Diagnosis not present

## 2024-04-27 DIAGNOSIS — I471 Supraventricular tachycardia, unspecified: Secondary | ICD-10-CM | POA: Diagnosis not present

## 2024-04-28 ENCOUNTER — Ambulatory Visit: Payer: Self-pay | Admitting: Physician Assistant

## 2024-04-30 NOTE — Progress Notes (Signed)
 "  Primary Care Physician: Onita Rush, MD Primary Cardiologist: Gordy Bergamo, MD Electrophysiologist: None  Referring Physician: Raphael Bring, PA   Nancy Baxter is a 84 y.o. female with a history of paroxysmal AF (on Eliquis ), CKD stage III, venous insufficiency,HTN, mild to moderated MVR and AVR, recurrent hip dislocation, mild thrombocytopenia, PVCs, GERD, who presents for 3-week follow-up.  Nancy Baxter was last seen on 04/09/2024 for post DCCV follow-up.  She was only able to maintain sinus rhythm for 4 days and was symptomatic at that time.  Shared decision was made made regarding rhythm management with either amiodarone  or Multaq and through  patient elected to begin amiodarone  with DCCV if not chemically converted in 3 weeks. She wore a 1-week ZIO monitor that showed 100% AF burden with no significant pauses or malignant rhythms noted.  She also completed updated 2D echo that showed stable EF of 55 to 60% with no RWMA and mild to moderate biatrial enlargement with mild to moderate MVR with no significant changes from prior study.  Nancy Baxter presents today for 3-week follow-up after initiating amiodarone .  On EKG unfortunately she is still in atrial fibrillation and reports no missed doses of her Eliquis  since previous follow-up. She experiences chest pressure and shortness of breath during physical exertion, such as walking uphill or on an incline, which necessitates stopping to catch her breath. The sensation is described as a heaviness rather than pain or tightness and does not occur at rest. These symptoms are particularly noted during activities like walking her dog, requiring her to pause to recover.  She is scheduled to follow-up with general cardiology on 05/11/2024.  ED precautions were discussed and patient is aware to seek care if discomfort continues following rest.  She reports increase in vivid dreams since initiating amiodarone  and we will reduce to once daily to see if this will alleviate  symptoms. In her social history, she lives alone and has a dog. She has a blood pressure monitor and a pulse oximeter at home, noting variability in readings between different fingers, which she attributes to carpal tunnel syndrome in her right hand. She drinks alcohol  in moderation, limiting herself to three ounces twice a week, and has reduced her coffee intake to one cup per day. Today, she denies symptoms of palpitations, chest pain, shortness of breath, orthopnea, PND, lower extremity edema, dizziness, presyncope, syncope, snoring, daytime somnolence, bleeding, or neurologic sequela. The patient is tolerating medications without difficulties and is otherwise without complaint today.   Discussed the use of AI scribe software for clinical note transcription with the patient, who gave verbal consent to proceed.   Atrial Fibrillation Management history: Previous antiarrhythmic drugs: Amiodarone  Previous cardioversions: 03/20/2024 Previous ablations: None Anticoagulation history: Eliquis   ROS- All systems are reviewed and negative except as per the HPI above.  Past Medical History:  Diagnosis Date   A-fib (HCC)    Anemia    pt. denies   Arthritis    GERD (gastroesophageal reflux disease)    Heart murmur    evaluated 56yr ago-no cardiac follow up needed   Hypertension    Postop Acute blood loss anemia 01/06/2012   Postop Hyponatremia 01/06/2012   Postop Transfusion 01/07/2012   Restless leg    Past Surgical History:  Procedure Laterality Date   BACK SURGERY  1961   spinal fusion age    CARDIOVERSION N/A 03/20/2024   Procedure: CARDIOVERSION;  Surgeon: Jeffrie Oneil BROCKS, MD;  Location: [facility];  Service: Cardiovascular;  Laterality: N/A;   CARPAL TUNNEL RELEASE Left 05/18/2016   Procedure: LEFT CARPAL TUNNEL RELEASE;  Surgeon: Arley Curia, MD;  Location: [facility];  Service: Orthopedics;  Laterality: Left;   EAR CYST EXCISION  02/23/2011   pt. denies   FOOT  ARTHRODESIS  5/12   hammer toes,bunionectomy   JOINT REPLACEMENT  2006   rt hip   JOINT REPLACEMENT  2008   lt hip   TONSILLECTOMY     age 54   TOTAL HIP REVISION  01/05/2012   Procedure: TOTAL HIP REVISION;  Surgeon: Dempsey LULLA Moan, MD;  Location: [facility];  Service: Orthopedics;  Laterality: Left;   TOTAL HIP REVISION Left 08/06/2020   Procedure: Left hip bearing surface revision;  Surgeon: Moan Dempsey, MD;  Location: [facility];  Service: Orthopedics;  Laterality: Left;    TOTAL KNEE ARTHROPLASTY Right 07/19/2022   Procedure: TOTAL KNEE ARTHROPLASTY;  Surgeon: Moan Dempsey, MD;  Location: [facility];  Service: Orthopedics;  Laterality: Right;   TRIGGER FINGER RELEASE  02/23/2011   Procedure: RELEASE TRIGGER FINGER/A-1 PULLEY;  Surgeon: Arley JONELLE Curia, MD;  Location: [facility];  Service: Orthopedics;  Laterality: Right;   TRIGGER FINGER RELEASE Left 07/26/2012   Procedure: RELEASE TRIGGER FINGER/A-1 PULLEY LEFT SMALL FINGER;  Surgeon: Arley JONELLE Curia, MD;  Location: [facility];  Service: Orthopedics;  Laterality: Left;   TRIGGER FINGER RELEASE Left 05/18/2016   Procedure: RELEASE TRIGGER FINGER/A-1 PULLEY LEFT INDEX;  Surgeon: Arley Curia, MD;  Location: [facility];  Service: Orthopedics;  Laterality: Left;   VAGINAL HYSTERECTOMY     Sulfa antibiotics, Codeine, Prednisone, Verapamil , Zolpidem tartrate, Adhesive [tape], and Diltiazem  Current Outpatient Medications  Medication Sig Dispense Refill   acetaminophen  (TYLENOL ) 650 MG CR tablet Take 1,300 mg by mouth 2 (two) times daily.     ALPRAZolam (XANAX) 0.25 MG tablet Take 1/2 - 1tablet by mouth at bedtime as needed Orally as directed; Duration: 10 days (Patient taking differently: at bedtime.)     apixaban  (ELIQUIS ) 2.5 MG TABS tablet Take 1 tablet by mouth twice daily 180 tablet 1   Blood Pressure Monitoring (OMRON 3 SERIES BP MONITOR) DEVI Use to check blood pressure. 1 each 0   Cyanocobalamin  (VITAMIN B-12 PO) Take 2,500 mcg by mouth daily.     ferrous sulfate  325 (65 FE) MG tablet Take 325 mg by mouth once a week.     furosemide  (LASIX ) 20 MG tablet Take 40 mg by mouth in the morning.     gabapentin  (NEURONTIN ) 100 MG capsule Take 200 mg by mouth at bedtime. RLS     losartan  (COZAAR ) 100 MG tablet Take 1 tablet (100 mg total) by mouth daily. (Patient taking differently: Take 50 mg by mouth daily.) 90 tablet 3   metoprolol  succinate (TOPROL -XL) 50 MG 24 hr tablet Take 1 tablet (50 mg total) by mouth daily. Decrease dose 90 tablet 3   zinc gluconate 50 MG tablet Take 100 mg by mouth in the morning.     amiodarone  (PACERONE ) 200 MG tablet Take 1 tablet (200 mg total) by mouth daily. 90 tablet 2   No current facility-administered medications for this encounter.    Physical Exam: BP 120/64   Pulse 82   Ht 4' 11 (1.499 m)   Wt 52.3 kg   BMI 23.27 kg/m   GEN: Well nourished, well developed in no acute distress NECK: No JVD; No carotid bruits CARDIAC: Irregularly irregular rate  and rhythm, no murmurs, rubs, gallops RESPIRATORY:  Clear to auscultation without rales, wheezing or rhonchi  ABDOMEN: Soft, non-tender, non-distended EXTREMITIES:  No edema; No deformity   Wt Readings from Last 3 Encounters:  05/01/24 52.3 kg  04/09/24 52.8 kg  03/20/24 51.3 kg    Lab Results  Component Value Date   TSH 2.380 02/28/2024   EKG today demonstrates:   EKG Interpretation Date/Time:  Tuesday May 01 2024 13:42:12 EST Ventricular Rate:  82 PR Interval:    QRS Duration:  78 QT Interval:  436 QTC Calculation: 509 R Axis:   -44  Text Interpretation: Atrial fibrillation Left axis deviation Low voltage QRS Confirmed by Wyn Manus [phone]) on 05/01/2024 1:43:47 PM        Echo Completed 04/16/2024:  1. Left ventricular ejection fraction, by estimation, is 55 to 60%. The  left ventricle has normal function. The left ventricle has no regional  wall motion abnormalities. Left  ventricular diastolic function could not  be evaluated.   2. Right ventricular systolic function is mildly reduced. The right  ventricular size is mildly enlarged. There is normal pulmonary artery  systolic pressure. The estimated right ventricular systolic pressure is  35.7 mmHg.   3. Left atrial size was mild to moderately dilated.   4. Right atrial size was mild to moderately dilated.   5. The mitral valve is grossly normal. Mild to moderate mitral valve  regurgitation. No evidence of mitral stenosis.   6. The aortic valve is tricuspid. Aortic valve regurgitation is mild.  Aortic valve sclerosis is present, with no evidence of aortic valve  stenosis.   7. The inferior vena cava is dilated in size with >50% respiratory  variability, suggesting right atrial pressure of 8 mmHg.   CHA2DS2-VASc Score = 4  The patient's score is based upon: CHF History: 0 HTN History: 1 Diabetes History: 0 Stroke History: 0 Vascular Disease History: 0 Age Score: 2 Gender Score: 1      ASSESSMENT AND PLAN: Persistent Atrial Fibrillation (ICD10:  I48.19) The patient's CHA2DS2-VASc score is 4, indicating a 4.8% annual risk of stroke.   -Heart rate controlled on current regimen. Insomnia and vivid dreams possibly medication-related. Previous cardioversion ineffective, amiodarone  initiated.  -She has been compliant with amiodarone  and unfortunately did not chemically convert by EKG today.  We will pursue DCCV as discussed at prior visit. - Reduced amiodarone  to 200 mg daily due to report of vivid dreams. - Continued on Toprol -XL 50 mg daily -CBC, CMET and TSH today - Will follow up with electrophysiology if rhythm not maintained post-cardioversion. - Continue Eliquis  2.5 mg twice daily  VHD: -history of mitral and Aortic regurgitation with borderline aortic dialation - 2D echo repeated 04/2024 with EF of 55-60% and mild to moderated MVR and moderated AVR - Continue Lasix  40 mg daily   HTN: -BP well  controlled. Continue current antihypertensive regimen.   High Risk Medication Monitoring (ICD 10: [id number]) Patient requires ongoing monitoring for anti-arrhythmic medication which has the potential to cause life threatening arrhythmias. Intervals on ECG acceptable for amiodarone  monitoring. Check cmet/TSH today.     Shortness of breath with exertion: -Patient reports shortness of breath with exertion such as walking up inclines described as a heaviness on her chest that resolves with rest. -Continue Toprol -XL 50 mg daily -ED precautions discussed and patient scheduled to follow-up with general cardiology-on 05/11/2024.  Secondary Hypercoagulable State (ICD10:  D68.69) The patient is at significant risk for stroke/thromboembolism based upon her CHA2DS2-VASc  Score of 4.  Continue Apixaban  (Eliquis ).   Signed,  Wyn Raddle, Jackee Shove, NP    05/01/2024 2:26 PM     Informed Consent   Shared Decision Making/Informed Consent The risks (stroke, cardiac arrhythmias rarely resulting in the need for a temporary or permanent pacemaker, skin irritation or burns and complications associated with conscious sedation including aspiration, arrhythmia, respiratory failure and death), benefits (restoration of normal sinus rhythm) and alternatives of a direct current cardioversion were explained in detail to Nancy Baxter and she agrees to proceed.      Follow up with the AF Clinic 2 weeks following DCCV.     "

## 2024-05-01 ENCOUNTER — Encounter (HOSPITAL_COMMUNITY): Payer: Self-pay | Admitting: Nurse Practitioner

## 2024-05-01 ENCOUNTER — Ambulatory Visit (HOSPITAL_COMMUNITY)
Admission: RE | Admit: 2024-05-01 | Discharge: 2024-05-01 | Disposition: A | Discharge status: Home / Self Care | Source: Ambulatory Visit | Attending: Nurse Practitioner | Admitting: Nurse Practitioner

## 2024-05-01 VITALS — BP 120/64 | HR 82 | Ht 59.0 in | Wt 115.2 lb

## 2024-05-01 DIAGNOSIS — I4891 Unspecified atrial fibrillation: Secondary | ICD-10-CM

## 2024-05-01 DIAGNOSIS — I4819 Other persistent atrial fibrillation: Secondary | ICD-10-CM

## 2024-05-01 DIAGNOSIS — Z9229 Personal history of other drug therapy: Secondary | ICD-10-CM | POA: Diagnosis not present

## 2024-05-01 DIAGNOSIS — R0602 Shortness of breath: Secondary | ICD-10-CM | POA: Diagnosis not present

## 2024-05-01 DIAGNOSIS — I10 Essential (primary) hypertension: Secondary | ICD-10-CM | POA: Diagnosis not present

## 2024-05-01 DIAGNOSIS — I080 Rheumatic disorders of both mitral and aortic valves: Secondary | ICD-10-CM | POA: Diagnosis not present

## 2024-05-01 DIAGNOSIS — D6859 Other primary thrombophilia: Secondary | ICD-10-CM

## 2024-05-01 MED ORDER — AMIODARONE HCL 200 MG PO TABS: 200.0000 mg | ORAL_TABLET | Freq: Every day | ORAL | 2 refills | Status: AC

## 2024-05-01 NOTE — Patient Instructions (Signed)
 Take amiodarone  200 mg once daily   Call us  to SCHEDULE CARDIOVERSION AND MAKE 2 WEEK POST APPOINTMENT    Cardioversion scheduled for:   - Arrive at the [facility] ([address]  and check in with ADMITTING at   - Do not eat or drink anything after midnight the night prior to your procedure.   - Take all your morning medication (except diabetic medications) with a sip of water  prior to arrival.  - Do NOT miss any doses of your blood thinner - if you should miss a dose or take a dose more than 4 hours late -- please notify our office immediately.  - You will not be able to drive home after your procedure. Please ensure you have a responsible adult to drive you home. You will need someone with you for 24 hours post procedure.     - Expect to be in the procedural area approximately 2 hours.   - If you feel as if you go back into normal rhythm prior to scheduled cardioversion, please notify our office immediately.   If your procedure is canceled in the cardioversion suite you will be charged a cancellation fee.    Exenatide (Byetta)  Liraglutide (Victoza, Saxenda)  Lixisenatide (Adlyxin)  Semaglutide (Rybelsus) Polyethylene Glycol Loxenatide   For those patients who have a scheduled procedure/anesthesia on the same day of the week as their dose, hold the medication on the day of surgery.  They can take their scheduled dose the week before.  **Patients on the above medications scheduled for elective procedures that have not held the medication for the appropriate amount of time are at risk of cancellation or change in the anesthetic plan.

## 2024-05-02 ENCOUNTER — Ambulatory Visit (HOSPITAL_COMMUNITY): Payer: Self-pay | Admitting: Nurse Practitioner

## 2024-05-02 LAB — CBC
Hematocrit: 41.5 % (ref 34.0–46.6)
Hemoglobin: 13.8 g/dL (ref 11.1–15.9)
MCH: 31.4 pg (ref 26.6–33.0)
MCHC: 33.3 g/dL (ref 31.5–35.7)
MCV: 94 fL (ref 79–97)
Platelets: 117 x10E3/uL — ABNORMAL LOW (ref 150–450)
RBC: 4.4 x10E6/uL (ref 3.77–5.28)
RDW: 12.5 % (ref 11.7–15.4)
WBC: 6.3 x10E3/uL (ref 3.4–10.8)

## 2024-05-02 LAB — COMPREHENSIVE METABOLIC PANEL WITH GFR
ALT: 44 IU/L — ABNORMAL HIGH (ref 0–32)
AST: 32 IU/L (ref 0–40)
Albumin: 4.2 g/dL (ref 3.7–4.7)
Alkaline Phosphatase: 196 IU/L — ABNORMAL HIGH (ref 48–129)
BUN/Creatinine Ratio: 18 (ref 12–28)
BUN: 41 mg/dL — ABNORMAL HIGH (ref 8–27)
Bilirubin Total: 0.4 mg/dL (ref 0.0–1.2)
CO2: 23 mmol/L (ref 20–29)
Calcium: 9 mg/dL (ref 8.7–10.3)
Chloride: 101 mmol/L (ref 96–106)
Creatinine, Ser: 2.25 mg/dL — ABNORMAL HIGH (ref 0.57–1.00)
Globulin, Total: 2.5 g/dL (ref 1.5–4.5)
Glucose: 74 mg/dL (ref 70–99)
Potassium: 4.1 mmol/L (ref 3.5–5.2)
Sodium: 138 mmol/L (ref 134–144)
Total Protein: 6.7 g/dL (ref 6.0–8.5)
eGFR: 21 mL/min/1.73 — ABNORMAL LOW

## 2024-05-02 LAB — TSH: TSH: 3.75 u[IU]/mL (ref 0.450–4.500)

## 2024-05-04 ENCOUNTER — Other Ambulatory Visit (HOSPITAL_COMMUNITY): Payer: Self-pay

## 2024-05-04 DIAGNOSIS — I4891 Unspecified atrial fibrillation: Secondary | ICD-10-CM

## 2024-05-11 ENCOUNTER — Ambulatory Visit: Attending: Cardiology | Admitting: Cardiology

## 2024-05-11 VITALS — BP 122/64 | HR 97 | Ht 61.0 in | Wt 114.0 lb

## 2024-05-11 DIAGNOSIS — N1832 Chronic kidney disease, stage 3b: Secondary | ICD-10-CM | POA: Diagnosis not present

## 2024-05-11 DIAGNOSIS — D6859 Other primary thrombophilia: Secondary | ICD-10-CM

## 2024-05-11 DIAGNOSIS — Z9229 Personal history of other drug therapy: Secondary | ICD-10-CM

## 2024-05-11 DIAGNOSIS — I4819 Other persistent atrial fibrillation: Secondary | ICD-10-CM

## 2024-05-11 NOTE — Patient Instructions (Addendum)
 Medication Instructions:  Your physician recommends that you continue on your current medications as directed. Please refer to the Current Medication list given to you today.  *If you need a refill on your cardiac medications before your next appointment, please call your pharmacy*  Follow-Up: At [facility], you and your health needs are our priority.  As part of our continuing mission to provide you with exceptional heart care, our providers are all part of one team.  This team includes your primary Cardiologist (physician) and Advanced Practice Providers or APPs (Physician Assistants and Nurse Practitioners) who all work together to provide you with the care you need, when you need it.  Your next appointment:    Monday, May 28, 2024 @ 2:15 in [place], [place]  Provider:   Elsie Norton, MD           We recommend signing up for the patient portal called MyChart.  Patients are able to view lab/test results, encounter notes, upcoming appointments, etc.  Non-urgent messages can be sent to your provider as well, go to [email or web address].

## 2024-05-11 NOTE — Progress Notes (Unsigned)
 " Cardiology Office Note:  .   Date:  05/12/2024  ID:  Nancy Baxter, DOB [birth year], MRN [id number] PCP: Onita Rush, MD  [facility] Providers Cardiologist:  Gordy Bergamo, MD Electrophysiologist:  Will Gladis Norton, MD   History of Present Illness: .   Nancy Baxter is a 84 y.o. female with a history of paroxysmal AF (on Eliquis ), CKD stage III, venous insufficiency,HTN, mild to moderated MVR and mild AVR, recurrent hip dislocation, mild thrombocytopenia, PVCs, GERD.  Last cardioversion was on 03/20/2024 for persistent atrial fibrillation.  Unfortunately she reverted back to A-fib, symptomatic with fatigue and dyspnea.  She is now on amiodarone  loading and plans for cardioversion in the near future.  She is also being referred to EP for further evaluation and consideration for ablation. Patient states that since starting amiodarone , her PCP has been very concerned due to elevated LFTs, her serum creatinine has also slightly worsened.  She continues to feel fatigued and palpitations with A-fib onset which she is presently experiencing.    Discussed the use of AI scribe software for clinical note transcription with the patient, who gave verbal consent to proceed.  History of Present Illness Nancy Baxter is an 84 year old female with atrial fibrillation who presents for follow-up regarding her medication management and upcoming cardioversion.  She has persistent symptomatic atrial fibrillation with only four days of sinus rhythm after her last cardioversion. Another cardioversion is planned but delayed about two weeks because she has not arranged an escort.  She was recently started on amiodarone  and developed insomnia and elevated liver enzymes, along with a rise in creatinine to 2.25 from her usual 1.6. Her amiodarone  was reduced to 200 mg once daily and losartan  to 50 mg daily. She reports poor tolerance of multiple medications.  She uses furosemide  40 mg twice daily for ankle  swelling. She did not start verapamil  because she mistakenly thought that prescription was a refill for furosemide .  Her creatinine has been chronically elevated and recently increased further, while liver enzymes only became abnormal after starting amiodarone  about a week before the last labs. She is scheduled to see her kidney specialist on February 2nd.  She volunteers with Alzheimer and dementia patients, which she finds rewarding.  Cardiac Studies relevent.    Echocardiogram 04/16/2024: Normal LV systolic function, EF 55 to 60%. Moderate biatrial enlargement. Mild to moderate MR and mild AI.  Event monitor 7 days starting 04/16/2024 for A-fib burden: Predominant rhythm is atrial fibrillation (100% burden with heart rate ranging from 56 to 136 bpm with average heart rate of 91 bpm. Occasional PVCs and ventricular couplets were noted. There were no significant pauses, or patient's symptoms reported.  Otherwise unremarkable transmission.     EKG:      Labs   Recent Labs    02/26/24 0131 02/28/24 0919 03/13/24 1600 05/01/24 1443  NA 137 140 137 138  K 4.4 4.9 4.4 4.1  CL 102 99 100 101  CO2 23 19* 19* 23  GLUCOSE 104* 91 122* 74  BUN 45* 36* 44* 41*  CREATININE 1.75* 1.67* 1.69* 2.25*  CALCIUM 9.8 9.6 9.3 9.0  GFRNONAA 28*  --   --   --     Lab Results  Component Value Date   ALT 44 (H) 05/01/2024   AST 32 05/01/2024   ALKPHOS 196 (H) 05/01/2024   BILITOT 0.4 05/01/2024      Latest Ref Rng & Units 05/01/2024    2:43 PM  02/26/2024    1:31 AM 07/21/2022    8:00 PM  CBC  WBC 3.4 - 10.8 x10E3/uL 6.3  10.3  10.7   Hemoglobin 11.1 - 15.9 g/dL 86.1  86.4  88.9   Hematocrit 34.0 - 46.6 % 41.5  40.6  32.7   Platelets 150 - 450 x10E3/uL 117  144  135    No results found for: HGBA1C  Lab Results  Component Value Date   TSH 3.750 05/01/2024     ROS  Review of Systems  Constitutional: Positive for malaise/fatigue.  Cardiovascular:  Positive for dyspnea on exertion  and palpitations. Negative for chest pain and leg swelling.   Physical Exam:   VS:  BP 122/64 (BP Location: Left Arm, Patient Position: Sitting, Cuff Size: Normal)   Pulse 97   Ht 5' 1 (1.549 m)   Wt 114 lb (51.7 kg)   SpO2 96%   BMI 21.54 kg/m    Wt Readings from Last 3 Encounters:  05/11/24 114 lb (51.7 kg)  05/01/24 115 lb 3.2 oz (52.3 kg)  04/09/24 116 lb 6.4 oz (52.8 kg)    BP Readings from Last 3 Encounters:  05/11/24 122/64  05/01/24 120/64  04/09/24 124/76   Physical Exam Neck:     Vascular: No carotid bruit or JVD.  Cardiovascular:     Rate and Rhythm: Normal rate. Rhythm irregular.     Pulses: Normal pulses and intact distal pulses.     Heart sounds: No murmur heard. Pulmonary:     Effort: Pulmonary effort is normal.     Breath sounds: Normal breath sounds.  Abdominal:     General: Bowel sounds are normal.     Palpations: Abdomen is soft.  Musculoskeletal:     Right lower leg: No edema.     Left lower leg: No edema.  Skin:    Capillary Refill: Capillary refill takes less than 2 seconds.     ASSESSMENT AND PLAN: .      ICD-10-CM   1. Persistent atrial fibrillation (HCC)  I48.19     2. History of use of high risk medication  Z92.29     3. Hypercoagulable state  D68.59     4. CKD stage 3b, GFR 30-44 ml/min (HCC)  N18.32      Assessment & Plan Persistent atrial fibrillation Experiencing persistent atrial fibrillation with symptoms of dyspnea, fatigue, and exhaustion. Previous cardioversion was ineffective, lasting only four days. Currently on amiodarone , which has caused insomnia and elevated liver enzymes. Amiodarone  is not preferred due to side effects and liver enzyme elevation. Ablation is considered a suitable option. - Proceed with cardioversion. - Referred to electrophysiology (EP) for ablation consultation. - Will consider alternative medication such as flecainide and consider discontinuing amiodarone  once cardioversion is performed as she has  elevated LFTs with amiodarone .  Chronic kidney disease stage 3b Chronic kidney disease stage 3b with creatinine level at 2.25, elevated from baseline of 1.6. Concern about kidney function, especially with medication use. - Consulted with nephrologist Dr. Bernardino Gasman on February 2nd.  History of use of high risk medication Currently on amiodarone , which has caused insomnia and elevated liver enzymes. Amiodarone  is not preferred due to side effects and liver enzyme elevation. - Discontinued amiodarone  due to side effects and liver enzyme elevation. - Will consider alternative medication such as flecainide.   Follow up: As needed with me as her main issue is atrial fibrillation, she will continue to follow-up with EP and A-fib clinic.  Although  she has not had any stress testing, she is very active and asymptomatic with a normal LVEF, hence suspicion for underlying CAD is very low.  Signed,  Gordy Bergamo, MD, [facility] 05/12/2024, 3:44 PM [facility] [address] [place], [place] [address] Phone: [phone]. Fax:  [phone]  "

## 2024-05-28 ENCOUNTER — Ambulatory Visit: Admitting: Cardiology
